# Patient Record
Sex: Male | Born: 1960
Health system: Southern US, Community
[De-identification: ages and names within clinical notes are randomized; demographics above are authoritative.]

## PROBLEM LIST (undated history)

## (undated) DIAGNOSIS — H209 Unspecified iridocyclitis: Secondary | ICD-10-CM

## (undated) DIAGNOSIS — R0600 Dyspnea, unspecified: Secondary | ICD-10-CM

## (undated) DIAGNOSIS — I10 Essential (primary) hypertension: Secondary | ICD-10-CM

## (undated) DIAGNOSIS — R0602 Shortness of breath: Secondary | ICD-10-CM

## (undated) DIAGNOSIS — R1013 Epigastric pain: Secondary | ICD-10-CM

## (undated) DIAGNOSIS — E669 Obesity, unspecified: Secondary | ICD-10-CM

## (undated) DIAGNOSIS — M1990 Unspecified osteoarthritis, unspecified site: Secondary | ICD-10-CM

## (undated) DIAGNOSIS — H40009 Preglaucoma, unspecified, unspecified eye: Secondary | ICD-10-CM

## (undated) DIAGNOSIS — I509 Heart failure, unspecified: Secondary | ICD-10-CM

## (undated) DIAGNOSIS — Z9109 Other allergy status, other than to drugs and biological substances: Secondary | ICD-10-CM

## (undated) DIAGNOSIS — R0609 Other forms of dyspnea: Secondary | ICD-10-CM

## (undated) DIAGNOSIS — J309 Allergic rhinitis, unspecified: Secondary | ICD-10-CM

## (undated) HISTORY — DX: Heart failure, unspecified: I50.9

## (undated) HISTORY — PX: MITRAL VALVE REPAIR: SHX2039

## (undated) HISTORY — DX: Obesity, unspecified: E66.9

## (undated) HISTORY — DX: Preglaucoma, unspecified, unspecified eye: H40.009

## (undated) HISTORY — DX: Allergic rhinitis, unspecified: J30.9

## (undated) HISTORY — PX: CARDIAC SURGERY: SHX584

## (undated) HISTORY — DX: Unspecified osteoarthritis, unspecified site: M19.90

## (undated) HISTORY — DX: Dyspnea, unspecified: R06.00

## (undated) HISTORY — DX: Unspecified iridocyclitis: H20.9

## (undated) HISTORY — DX: Epigastric pain: R10.13

## (undated) HISTORY — DX: Essential (primary) hypertension: I10

## (undated) HISTORY — DX: Other forms of dyspnea: R06.09

## (undated) HISTORY — DX: Shortness of breath: R06.02

## (undated) HISTORY — PX: COLONOSCOPY: SHX174

---

## 2003-11-15 HISTORY — PX: KNEE ARTHROSCOPY: SUR90

## 2003-11-15 HISTORY — PX: SHOULDER ARTHROSCOPY: SHX128

## 2016-03-29 ENCOUNTER — Encounter: Payer: Self-pay | Admitting: Internal Medicine

## 2016-03-29 ENCOUNTER — Emergency Department (HOSPITAL_COMMUNITY): Payer: BLUE CROSS/BLUE SHIELD

## 2016-03-29 ENCOUNTER — Ambulatory Visit (INDEPENDENT_AMBULATORY_CARE_PROVIDER_SITE_OTHER): Payer: BLUE CROSS/BLUE SHIELD | Admitting: Pharmacist

## 2016-03-29 ENCOUNTER — Ambulatory Visit (INDEPENDENT_AMBULATORY_CARE_PROVIDER_SITE_OTHER): Payer: BLUE CROSS/BLUE SHIELD | Admitting: Internal Medicine

## 2016-03-29 ENCOUNTER — Inpatient Hospital Stay (HOSPITAL_COMMUNITY)
Admission: EM | Admit: 2016-03-29 | Discharge: 2016-04-06 | DRG: 871 | Disposition: A | Discharge status: Home / Self Care | Payer: BLUE CROSS/BLUE SHIELD | Attending: Internal Medicine | Admitting: Internal Medicine

## 2016-03-29 ENCOUNTER — Encounter (HOSPITAL_COMMUNITY): Payer: Self-pay | Admitting: Nurse Practitioner

## 2016-03-29 VITALS — BP 98/56 | HR 65 | Temp 96.7°F

## 2016-03-29 DIAGNOSIS — I4892 Unspecified atrial flutter: Secondary | ICD-10-CM | POA: Diagnosis present

## 2016-03-29 DIAGNOSIS — I351 Nonrheumatic aortic (valve) insufficiency: Secondary | ICD-10-CM | POA: Diagnosis not present

## 2016-03-29 DIAGNOSIS — I480 Paroxysmal atrial fibrillation: Secondary | ICD-10-CM

## 2016-03-29 DIAGNOSIS — Z7901 Long term (current) use of anticoagulants: Secondary | ICD-10-CM | POA: Diagnosis not present

## 2016-03-29 DIAGNOSIS — Z6841 Body Mass Index (BMI) 40.0 and over, adult: Secondary | ICD-10-CM

## 2016-03-29 DIAGNOSIS — J9690 Respiratory failure, unspecified, unspecified whether with hypoxia or hypercapnia: Secondary | ICD-10-CM

## 2016-03-29 DIAGNOSIS — I484 Atypical atrial flutter: Secondary | ICD-10-CM | POA: Diagnosis not present

## 2016-03-29 DIAGNOSIS — I5043 Acute on chronic combined systolic (congestive) and diastolic (congestive) heart failure: Secondary | ICD-10-CM | POA: Diagnosis present

## 2016-03-29 DIAGNOSIS — J90 Pleural effusion, not elsewhere classified: Secondary | ICD-10-CM | POA: Diagnosis not present

## 2016-03-29 DIAGNOSIS — Z7982 Long term (current) use of aspirin: Secondary | ICD-10-CM

## 2016-03-29 DIAGNOSIS — R791 Abnormal coagulation profile: Secondary | ICD-10-CM | POA: Diagnosis present

## 2016-03-29 DIAGNOSIS — D696 Thrombocytopenia, unspecified: Secondary | ICD-10-CM | POA: Diagnosis present

## 2016-03-29 DIAGNOSIS — J9601 Acute respiratory failure with hypoxia: Secondary | ICD-10-CM | POA: Diagnosis present

## 2016-03-29 DIAGNOSIS — I313 Pericardial effusion (noninflammatory): Secondary | ICD-10-CM | POA: Diagnosis present

## 2016-03-29 DIAGNOSIS — Z79899 Other long term (current) drug therapy: Secondary | ICD-10-CM

## 2016-03-29 DIAGNOSIS — Z9889 Other specified postprocedural states: Secondary | ICD-10-CM | POA: Insufficient documentation

## 2016-03-29 DIAGNOSIS — R680 Hypothermia, not associated with low environmental temperature: Secondary | ICD-10-CM | POA: Diagnosis present

## 2016-03-29 DIAGNOSIS — N182 Chronic kidney disease, stage 2 (mild): Secondary | ICD-10-CM | POA: Diagnosis present

## 2016-03-29 DIAGNOSIS — I959 Hypotension, unspecified: Secondary | ICD-10-CM | POA: Insufficient documentation

## 2016-03-29 DIAGNOSIS — I130 Hypertensive heart and chronic kidney disease with heart failure and stage 1 through stage 4 chronic kidney disease, or unspecified chronic kidney disease: Secondary | ICD-10-CM | POA: Diagnosis present

## 2016-03-29 DIAGNOSIS — R0609 Other forms of dyspnea: Secondary | ICD-10-CM

## 2016-03-29 DIAGNOSIS — R0600 Dyspnea, unspecified: Secondary | ICD-10-CM | POA: Diagnosis present

## 2016-03-29 DIAGNOSIS — R739 Hyperglycemia, unspecified: Secondary | ICD-10-CM | POA: Diagnosis present

## 2016-03-29 DIAGNOSIS — B957 Other staphylococcus as the cause of diseases classified elsewhere: Secondary | ICD-10-CM | POA: Diagnosis present

## 2016-03-29 DIAGNOSIS — I340 Nonrheumatic mitral (valve) insufficiency: Secondary | ICD-10-CM | POA: Diagnosis not present

## 2016-03-29 DIAGNOSIS — A419 Sepsis, unspecified organism: Secondary | ICD-10-CM | POA: Diagnosis present

## 2016-03-29 DIAGNOSIS — E872 Acidosis: Secondary | ICD-10-CM | POA: Diagnosis present

## 2016-03-29 DIAGNOSIS — Z952 Presence of prosthetic heart valve: Secondary | ICD-10-CM | POA: Diagnosis not present

## 2016-03-29 DIAGNOSIS — I428 Other cardiomyopathies: Secondary | ICD-10-CM | POA: Diagnosis present

## 2016-03-29 DIAGNOSIS — I9589 Other hypotension: Secondary | ICD-10-CM | POA: Diagnosis not present

## 2016-03-29 DIAGNOSIS — N179 Acute kidney failure, unspecified: Secondary | ICD-10-CM | POA: Diagnosis present

## 2016-03-29 DIAGNOSIS — Z8679 Personal history of other diseases of the circulatory system: Secondary | ICD-10-CM

## 2016-03-29 DIAGNOSIS — Y95 Nosocomial condition: Secondary | ICD-10-CM | POA: Diagnosis present

## 2016-03-29 DIAGNOSIS — Z888 Allergy status to other drugs, medicaments and biological substances status: Secondary | ICD-10-CM

## 2016-03-29 DIAGNOSIS — Q211 Atrial septal defect: Secondary | ICD-10-CM | POA: Diagnosis not present

## 2016-03-29 DIAGNOSIS — R7881 Bacteremia: Secondary | ICD-10-CM | POA: Clinically undetermined

## 2016-03-29 DIAGNOSIS — R0602 Shortness of breath: Secondary | ICD-10-CM

## 2016-03-29 DIAGNOSIS — D6489 Other specified anemias: Secondary | ICD-10-CM | POA: Diagnosis present

## 2016-03-29 DIAGNOSIS — I319 Disease of pericardium, unspecified: Secondary | ICD-10-CM | POA: Diagnosis not present

## 2016-03-29 DIAGNOSIS — I5021 Acute systolic (congestive) heart failure: Secondary | ICD-10-CM | POA: Insufficient documentation

## 2016-03-29 DIAGNOSIS — J189 Pneumonia, unspecified organism: Secondary | ICD-10-CM | POA: Diagnosis present

## 2016-03-29 DIAGNOSIS — I3139 Other pericardial effusion (noninflammatory): Secondary | ICD-10-CM | POA: Insufficient documentation

## 2016-03-29 DIAGNOSIS — J9600 Acute respiratory failure, unspecified whether with hypoxia or hypercapnia: Secondary | ICD-10-CM

## 2016-03-29 HISTORY — DX: Other allergy status, other than to drugs and biological substances: Z91.09

## 2016-03-29 LAB — BASIC METABOLIC PANEL
Anion gap: 13 (ref 5–15)
BUN: 40 mg/dL — ABNORMAL HIGH (ref 6–20)
CALCIUM: 9.1 mg/dL (ref 8.9–10.3)
CO2: 23 mmol/L (ref 22–32)
CREATININE: 2.05 mg/dL — AB (ref 0.61–1.24)
Chloride: 99 mmol/L — ABNORMAL LOW (ref 101–111)
GFR calc non Af Amer: 35 mL/min — ABNORMAL LOW (ref >60)
GFR, EST AFRICAN AMERICAN: 40 mL/min — AB (ref >60)
Glucose, Bld: 115 mg/dL — ABNORMAL HIGH (ref 65–99)
Potassium: 4.3 mmol/L (ref 3.5–5.1)
Sodium: 135 mmol/L (ref 135–145)

## 2016-03-29 LAB — URINALYSIS, ROUTINE W REFLEX MICROSCOPIC
Bilirubin Urine: NEGATIVE
Glucose, UA: NEGATIVE mg/dL
Hgb urine dipstick: NEGATIVE
Ketones, ur: NEGATIVE mg/dL
LEUKOCYTES UA: NEGATIVE
NITRITE: NEGATIVE
Protein, ur: NEGATIVE mg/dL
SPECIFIC GRAVITY, URINE: 1.023 (ref 1.005–1.030)
pH: 5.5 (ref 5.0–8.0)

## 2016-03-29 LAB — I-STAT CG4 LACTIC ACID, ED
Lactic Acid, Venous: 3.52 mmol/L (ref 0.5–2.0)
Lactic Acid, Venous: 1.94 mmol/L (ref 0.5–2.0)

## 2016-03-29 LAB — PROTIME-INR
INR: 3.58 — ABNORMAL HIGH (ref 0.00–1.49)
PROTHROMBIN TIME: 34.9 s — AB (ref 11.6–15.2)

## 2016-03-29 LAB — HEPATIC FUNCTION PANEL
ALBUMIN: 3 g/dL — AB (ref 3.5–5.0)
ALK PHOS: 107 U/L (ref 38–126)
ALT: 112 U/L — AB (ref 17–63)
AST: 51 U/L — ABNORMAL HIGH (ref 15–41)
Bilirubin, Direct: 0.3 mg/dL (ref 0.1–0.5)
Indirect Bilirubin: 0.4 mg/dL (ref 0.3–0.9)
TOTAL PROTEIN: 7.2 g/dL (ref 6.5–8.1)
Total Bilirubin: 0.7 mg/dL (ref 0.3–1.2)

## 2016-03-29 LAB — CBC
HCT: 38.9 % — ABNORMAL LOW (ref 39.0–52.0)
Hemoglobin: 12.5 g/dL — ABNORMAL LOW (ref 13.0–17.0)
MCH: 30.1 pg (ref 26.0–34.0)
MCHC: 32.1 g/dL (ref 30.0–36.0)
MCV: 93.7 fL (ref 78.0–100.0)
PLATELETS: 644 10*3/uL — AB (ref 150–400)
RBC: 4.15 MIL/uL — AB (ref 4.22–5.81)
RDW: 13.1 % (ref 11.5–15.5)
WBC: 22 10*3/uL — ABNORMAL HIGH (ref 4.0–10.5)

## 2016-03-29 LAB — I-STAT TROPONIN, ED: TROPONIN I, POC: 0.22 ng/mL — AB (ref 0.00–0.08)

## 2016-03-29 LAB — PROCALCITONIN: Procalcitonin: 0.5 ng/mL

## 2016-03-29 LAB — GLUCOSE, CAPILLARY: Glucose-Capillary: 151 mg/dL — ABNORMAL HIGH (ref 65–99)

## 2016-03-29 LAB — POCT INR: INR: 5.4

## 2016-03-29 LAB — BRAIN NATRIURETIC PEPTIDE: B Natriuretic Peptide: 286.5 pg/mL — ABNORMAL HIGH (ref 0.0–100.0)

## 2016-03-29 LAB — TROPONIN I: TROPONIN I: 0.12 ng/mL — AB (ref <0.031)

## 2016-03-29 LAB — MRSA PCR SCREENING: MRSA by PCR: NEGATIVE

## 2016-03-29 MED ORDER — VANCOMYCIN HCL 10 G IV SOLR
1750.0000 mg | INTRAVENOUS | Status: DC
  Administered 2016-03-30: 1750 mg via INTRAVENOUS
  Filled 2016-03-29 (×2): qty 1750

## 2016-03-29 MED ORDER — ATORVASTATIN CALCIUM 40 MG PO TABS
40.0000 mg | ORAL_TABLET | Freq: Every day | ORAL | Status: DC
  Administered 2016-03-29 – 2016-04-05 (×8): 40 mg via ORAL
  Filled 2016-03-29 (×8): qty 1

## 2016-03-29 MED ORDER — DEXTROSE 5 % IV SOLN
2.0000 g | Freq: Once | INTRAVENOUS | Status: AC
  Administered 2016-03-29: 2 g via INTRAVENOUS
  Filled 2016-03-29: qty 2

## 2016-03-29 MED ORDER — SODIUM CHLORIDE 0.9 % IV BOLUS (SEPSIS)
1000.0000 mL | Freq: Once | INTRAVENOUS | Status: AC
Start: 2016-03-29 — End: 2016-03-29
  Administered 2016-03-29: 1000 mL via INTRAVENOUS

## 2016-03-29 MED ORDER — SODIUM CHLORIDE 0.9 % IV BOLUS (SEPSIS)
30.0000 mL/kg | Freq: Once | INTRAVENOUS | Status: DC
  Filled 2016-03-29: qty 4500

## 2016-03-29 MED ORDER — PIPERACILLIN-TAZOBACTAM 3.375 G IVPB
3.3750 g | Freq: Three times a day (TID) | INTRAVENOUS | Status: DC
  Administered 2016-03-30 – 2016-04-02 (×8): 3.375 g via INTRAVENOUS
  Filled 2016-03-29 (×11): qty 50

## 2016-03-29 MED ORDER — AMIODARONE IV BOLUS ONLY 150 MG/100ML: 150.0000 mg | Freq: Once | INTRAVENOUS | Status: DC

## 2016-03-29 MED ORDER — METOPROLOL TARTRATE 50 MG PO TABS
75.0000 mg | ORAL_TABLET | Freq: Two times a day (BID) | ORAL | Status: DC
  Administered 2016-03-30 – 2016-04-06 (×15): 75 mg via ORAL
  Filled 2016-03-29 (×16): qty 1

## 2016-03-29 MED ORDER — SODIUM CHLORIDE 0.9 % IV SOLN: 250.0000 mL | INTRAVENOUS | Status: DC | PRN

## 2016-03-29 MED ORDER — VANCOMYCIN HCL 10 G IV SOLR
2500.0000 mg | Freq: Once | INTRAVENOUS | Status: AC
  Administered 2016-03-29: 2500 mg via INTRAVENOUS
  Filled 2016-03-29: qty 2500

## 2016-03-29 MED ORDER — AMIODARONE HCL 200 MG PO TABS
200.0000 mg | ORAL_TABLET | Freq: Every morning | ORAL | Status: DC
  Administered 2016-03-30: 200 mg via ORAL
  Filled 2016-03-29: qty 1

## 2016-03-29 MED ORDER — SODIUM CHLORIDE 0.9 % IV SOLN
INTRAVENOUS | Status: DC
  Administered 2016-03-29: 18:00:00 via INTRAVENOUS

## 2016-03-29 NOTE — ED Notes (Signed)
Family at bedside. 

## 2016-03-29 NOTE — ED Notes (Signed)
Holding fluids per CCM.

## 2016-03-29 NOTE — Progress Notes (Signed)
    OFFICE NOTE  Chief Complaint:  DOE, sweating, fatigue  Primary Care Physician: Pcp Not In System  HPI:  Henry Dougherty is a 55 y.o. male who I briefly saw today as the doctor of the day in the office. I was called to the Coumadin clinic by one of our pharmacists who noted that the patient was profusely diaphoretic, progressively short of breath and fatigue. I briefly reviewed paperwork as Henry Dougherty has not yet established as a patient of our practice. It turns out he recently had what sounds like acute severe mitral regurgitation secondary to a ruptured mitral valve cord and underwent mitral valve repair. He also had atrial fibrillation and underwent a MAZE procedure as well as clipping of the left atrial appendage and closure of the PFO. 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 He was accompanied today by his wife, daughter and granddaughter. Henry Dougherty initially looked quite ill, pale and diaphoretic and short of breath. Blood pressure was notably hypotensive. He did not have fever. I discussed options with him but felt that he should present to the emergency department for further workup. I'm concerned about a pneumonia and/or possibly bacteremia or sepsis given his recent surgery.   PMHx:  Past Medical History  Diagnosis Date  . Environmental allergies     Past Surgical History  Procedure Laterality Date  . Mitral valve repair    . Cardiac surgery      FAMHx:  No family history on file.  SOCHx:   reports that he has never smoked. He does not have any smokeless tobacco history on file. He reports that he drinks alcohol. He reports that he does not use illicit drugs.  ALLERGIES:  No Known Allergies  ROS: Review of systems not obtained due to patient factors.  HOME MEDS: No current outpatient prescriptions on file.   No current facility-administered medications for this visit.    LABS/IMAGING: No results found  for this or any previous visit (from the past 48 hour(s)). No results found.  WEIGHTS: Wt Readings from Last 3 Encounters:  No data found for Wt    VITALS: There were no vitals taken for this visit.  EXAM: General appearance: alert, mild distress and moderately obese Lungs: diminished breath sounds RLL and rales RLL Heart: regular rate and rhythm Extremities: extremities normal, atraumatic, no cyanosis or edema Skin: Diffuse ecchymosis Neurologic: Grossly normal  EKG: Deferred  ASSESSMENT: 1. Arterial hypotension 2. Progressive DOE 3. Recent Mitral valve repair/MAZE/PFO closure and LAA ligation 4. PAF on warfarin - supratherapeutic INR  PLAN: 1.   Henry Dougherty was seen acutely today during a Coumadin clinic visit at which time a quick assessment reveals significant hypotension, progressive shortness of breath, decreased breath sounds particularly the right lower lobe and signs and symptoms concerning for possible infection. He is hypotensive and diaphoretic. His INR is supratherapeutic on warfarin. Given his recent surgery and instrumentation, be concerned about possible infection. I've advised him to proceed to the emergency department. This will allow [place] to more quickly workup his symptoms. As he is a new patient to our practice I'm happy to see him in follow-up since we have already established a relationship.  Pixie Casino, MD, [facility] Attending Cardiologist [place] 03/29/2016, 1:15 PM

## 2016-03-29 NOTE — ED Notes (Signed)
[facility] notified on code sepsis notified @ 1420.

## 2016-03-29 NOTE — Patient Instructions (Signed)
Please go to the emergency room to be evaluated for SOB.

## 2016-03-29 NOTE — ED Provider Notes (Signed)
CSN: [id number]     Arrival date & time 03/29/16  1245 History   First MD Initiated Contact with Patient 03/29/16 1314     Chief Complaint  Patient presents with  . Shortness of Breath     (Consider location/radiation/quality/duration/timing/severity/associated sxs/prior Treatment) HPI Henry Dougherty is a 55 y.o. male here for evaluation of shortness of breath. Patient reports roughly 2 weeks ago he had open heart surgery in [place], no identifiable coronary artery disease. Wife at bedside reports they did perform MAZE procedure and had to repair his mitral valve. He is followed by Dr. Debara Pickett now. He was at Coumadin clinic today and his INR was noted to be 5.4. He reports over the past 2 days he has become incredibly short of breath, extensive diaphoresis, blurred vision with head movement and episodes of hot flashes. He denies any fevers, chills, overt chest pain, nausea or vomiting, leg swelling or cough. Was recommended to come to emergency department today from Coumadin clinic due to severe shortness of breath.  Past Medical History  Diagnosis Date  . Environmental allergies    Past Surgical History  Procedure Laterality Date  . Mitral valve repair    . Cardiac surgery     History reviewed. No pertinent family history. Social History  Substance Use Topics  . Smoking status: Never Smoker   . Smokeless tobacco: None  . Alcohol Use: Yes    Review of Systems A 10 point review of systems was completed and was negative except for pertinent positives and negatives as mentioned in the history of present illness     Allergies  Review of patient's allergies indicates no known allergies.  Home Medications   Prior to Admission medications   Not on File   BP 99/58 mmHg  Pulse 131  Temp(Src) 97.5 F (36.4 C) (Oral)  Resp 20  SpO2 97% Physical Exam  Constitutional: He is oriented to person, place, and time. He appears well-developed and well-nourished.  Obese Caucasian male.  Increased work of breathing. 98% on 2 L nasal cannula.  HENT:  Head: Normocephalic and atraumatic.  Mouth/Throat: Oropharynx is clear and moist.  Eyes: Conjunctivae are normal. Pupils are equal, round, and reactive to light. Right eye exhibits no discharge. Left eye exhibits no discharge. No scleral icterus.  Neck: Normal range of motion. Neck supple.  Cardiovascular:  Tachycardic  Pulmonary/Chest:  Increased work of breathing on 2 L nasal cannula. Diffuse rales and right fields  Abdominal: Soft. There is no tenderness.  Musculoskeletal: He exhibits no edema or tenderness.  Neurological: He is alert and oriented to person, place, and time.  Cranial Nerves II-XII grossly intact  Skin: Skin is warm and dry. No rash noted.  Psychiatric: He has a normal mood and affect.  Nursing note and vitals reviewed.   ED Course  Procedures (including critical care time) Labs Review Labs Reviewed  BASIC METABOLIC PANEL - Abnormal; Notable for the following:    Chloride 99 (*)    Glucose, Bld 115 (*)    BUN 40 (*)    Creatinine, Ser 2.05 (*)    GFR calc non Af Amer 35 (*)    GFR calc Af Amer 40 (*)    All other components within normal limits  CBC - Abnormal; Notable for the following:    WBC 22.0 (*)    RBC 4.15 (*)    Hemoglobin 12.5 (*)    HCT 38.9 (*)    Platelets 644 (*)    All other  components within normal limits  PROTIME-INR - Abnormal; Notable for the following:    Prothrombin Time 34.9 (*)    INR 3.58 (*)    All other components within normal limits  URINALYSIS, ROUTINE W REFLEX MICROSCOPIC (NOT AT [facility]) - Abnormal; Notable for the following:    APPearance HAZY (*)    All other components within normal limits  HEPATIC FUNCTION PANEL - Abnormal; Notable for the following:    Albumin 3.0 (*)    AST 51 (*)    ALT 112 (*)    All other components within normal limits  BRAIN NATRIURETIC PEPTIDE - Abnormal; Notable for the following:    B Natriuretic Peptide 286.5 (*)    All  other components within normal limits  TROPONIN I - Abnormal; Notable for the following:    Troponin I 0.12 (*)    All other components within normal limits  TROPONIN I - Abnormal; Notable for the following:    Troponin I 0.12 (*)    All other components within normal limits  TROPONIN I - Abnormal; Notable for the following:    Troponin I 0.14 (*)    All other components within normal limits  GLUCOSE, CAPILLARY - Abnormal; Notable for the following:    Glucose-Capillary 151 (*)    All other components within normal limits  BASIC METABOLIC PANEL - Abnormal; Notable for the following:    Glucose, Bld 165 (*)    BUN 34 (*)    Creatinine, Ser 1.52 (*)    Calcium 8.5 (*)    GFR calc non Af Amer 50 (*)    GFR calc Af Amer 57 (*)    All other components within normal limits  CBC - Abnormal; Notable for the following:    WBC 18.5 (*)    RBC 3.51 (*)    Hemoglobin 10.7 (*)    HCT 32.5 (*)    Platelets 525 (*)    All other components within normal limits  I-STAT TROPOININ, ED - Abnormal; Notable for the following:    Troponin i, poc 0.22 (*)    All other components within normal limits  I-STAT CG4 LACTIC ACID, ED - Abnormal; Notable for the following:    Lactic Acid, Venous 3.52 (*)    All other components within normal limits  MRSA PCR SCREENING  CULTURE, BLOOD (ROUTINE X 2)  CULTURE, BLOOD (ROUTINE X 2)  URINE CULTURE  URINE CULTURE  PROCALCITONIN  I-STAT CG4 LACTIC ACID, ED    Imaging Review Dg Chest Portable 1 View  03/29/2016  CLINICAL DATA:  Shortness of breath since yesterday. Patient had recent mitro valve repair in [place]. EXAM: PORTABLE CHEST 1 VIEW COMPARISON:  None. FINDINGS: The mediastinal contour is normal. Heart size enlarged. Mitral valvular replacement ring is noted. There is consolidation of the right mid and lung base. There is a small right pleural effusion. The left lung is clear. No acute abnormalities identified in the visualized bones. IMPRESSION:  Consolidation of the right mid and lung base with right pleural effusion for pneumonia. Electronically Signed   By: Abelardo Diesel M.D.   On: 03/29/2016 13:56   I have personally reviewed and evaluated these images and lab results as part of my medical decision-making.   EKG Interpretation   Date/Time:  Tuesday Mar 29 2016 12:50:34 EDT Ventricular Rate:  130 PR Interval:  112 QRS Duration: 94 QT Interval:  274 QTC Calculation: 403 R Axis:   46 Text Interpretation:  Sinus tachycardia Low voltage  QRS Nonspecific ST and  T wave abnormality Abnormal ECG Confirmed by RAY MD, Andee Poles [phone]) on  03/29/2016 1:53:18 PM     Meds given in ED:  Medications  ceFEPIme (MAXIPIME) 2 g in dextrose 5 % 50 mL IVPB (not administered)  vancomycin (VANCOCIN) 2,500 mg in sodium chloride 0.9 % 500 mL IVPB (not administered)  sodium chloride 0.9 % bolus 1,000 mL (1,000 mLs Intravenous New Bag/Given 03/29/16 1411)    New Prescriptions   No medications on file   Filed Vitals:   03/29/16 1304 03/29/16 1330  BP: 99/58 95/67  Pulse: 131 131  Temp: 97.5 F (36.4 C)   TempSrc: Oral   Resp: 20 24  SpO2: 97% 98%    MDM  Earney Schofield is a 55 y.o. male with recent maze procedure and mitral valve repair, here for evaluation of shortness of breath, diaphoresis. On arrival, patient does have increased work of breathing, on 2 L nasal cannula satting at 96%. Slightly hypotensive at 98/56, heart rate 130s. Maintaining conversations speaking in full sentences. Given recent surgical procedure, concern for pneumonia versus possible bacteremia versus other surgical complication. Obtaining screening labs, chest x-ray. Lactic acid 3.52, leukocytosis 22.0, troponin elevated 0.22, INR 3.58. Chest x-ray shows right-sided pneumonia. We'll treat for healthcare associated pneumonia. Discussed with Dr. Jeanell Sparrow who also saw and evaluated the patient. Plan for ICU medical admission. Discussed with critical care, Dr. Wonda Amis, will see  in ED. Discussed with critical care Salvadore Dom, NP-will admit. Final diagnoses:  SOB (shortness of breath)      Comer Locket, PA-C 03/30/16 [id number]  Pattricia Boss, MD 04/02/16 1429

## 2016-03-29 NOTE — ED Notes (Signed)
Pt had episode of coughing up a small amount of pink frothy sputum. NP paul made aware.

## 2016-03-29 NOTE — ED Notes (Signed)
He c/o2 day history of episodes of diaphoresis, sob, weakness. Symptoms seem to increase after exertion.  he had open heart surgery in [place] 2 weeks ago. He is alert and able to speak in full sentences in triage.

## 2016-03-29 NOTE — Progress Notes (Addendum)
Pharmacy Antibiotic Note  Henry Dougherty is a 55 y.o. male admitted on 03/29/2016 with pneumonia.  Pharmacy has been consulted for vancomycin dosing. Cefepime ordered x 1 per MD in the ED. AF, wbc 22, LA 3.52. SCr 2.05 on admit, normalized CrCl~40.  Code sepsis called at 63 - abx already started at 14.  Plan: Vanc 2500mg  IV x 1; then 1750mg  IV q24h Cefepime 2g IV x 1 per MD Monitor clinical progress, c/s, renal function, abx plan/LOT VT@SS  as indicated F/u need to continue cefepime?     Temp (24hrs), Avg:97.1 F (36.2 C), Min:96.7 F (35.9 C), Max:97.5 F (36.4 C)   Recent Labs Lab 03/29/16 1347  LATICACIDVEN 3.52*    CrCl cannot be calculated (Unknown ideal weight.).    No Known Allergies  Antimicrobials this admission: 5/16 vanc >>  5/16 cefepime x1  Dose adjustments this admission:   Microbiology results: 5/16 BCx:  5/116 UCx:     Elicia Lamp, PharmD, BCPS Clinical Pharmacist Pager [phone] 03/29/2016 2:03 PM
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
## 2016-03-29 NOTE — ED Notes (Signed)
Critical care at bedside  

## 2016-03-29 NOTE — Progress Notes (Signed)
Pharmacy Antibiotic Note  Rylan Mine is a 55 y.o. male admitted on 03/29/2016 with HCAP.  Pharmacy has been consulted for Zosyn dosing.  Received cefepime x 1 at 1400  in the ED. Afebrile, wbc 22, LA 3.52. SCr 2.05 on admit, normalized CrCl~40. Also started on vancomycin  Plan: Zosyn 3.375g IV q8h (4 hour infusion).  Weight: (!) 326 lb (147.873 kg)  Temp (24hrs), Avg:97.1 F (36.2 C), Min:96.7 F (35.9 C), Max:97.5 F (36.4 C)   Recent Labs Lab 03/29/16 1250 03/29/16 1347 03/29/16 1611  WBC 22.0*  --   --   CREATININE 2.05*  --   --   LATICACIDVEN  --  3.52* 1.94    CrCl cannot be calculated (Unknown ideal weight.).    Allergies  Allergen Reactions  . Amiodarone     Blisters and whelps to IV FORM.    Antimicrobials this admission: 5/16 cefepime >> x1 5/16 vancomycin >>  5/16 Zosyn >>  Dose adjustments this admission: N/A  Microbiology results: 5/16 BCx:  5/16 UCx:     Thank you for allowing pharmacy to be a part of this patient's care.  Wynelle Fanny 03/29/2016 5:39 PM

## 2016-03-30 ENCOUNTER — Inpatient Hospital Stay (HOSPITAL_COMMUNITY): Payer: BLUE CROSS/BLUE SHIELD

## 2016-03-30 DIAGNOSIS — I4892 Unspecified atrial flutter: Secondary | ICD-10-CM

## 2016-03-30 DIAGNOSIS — Z8679 Personal history of other diseases of the circulatory system: Secondary | ICD-10-CM

## 2016-03-30 DIAGNOSIS — I340 Nonrheumatic mitral (valve) insufficiency: Secondary | ICD-10-CM

## 2016-03-30 DIAGNOSIS — J189 Pneumonia, unspecified organism: Secondary | ICD-10-CM | POA: Diagnosis present

## 2016-03-30 DIAGNOSIS — R7881 Bacteremia: Secondary | ICD-10-CM

## 2016-03-30 DIAGNOSIS — I9589 Other hypotension: Secondary | ICD-10-CM

## 2016-03-30 DIAGNOSIS — Z9889 Other specified postprocedural states: Secondary | ICD-10-CM

## 2016-03-30 LAB — BASIC METABOLIC PANEL
ANION GAP: 13 (ref 5–15)
BUN: 34 mg/dL — ABNORMAL HIGH (ref 6–20)
CHLORIDE: 101 mmol/L (ref 101–111)
CO2: 22 mmol/L (ref 22–32)
Calcium: 8.5 mg/dL — ABNORMAL LOW (ref 8.9–10.3)
Creatinine, Ser: 1.52 mg/dL — ABNORMAL HIGH (ref 0.61–1.24)
GFR calc Af Amer: 57 mL/min — ABNORMAL LOW (ref >60)
GFR calc non Af Amer: 50 mL/min — ABNORMAL LOW (ref >60)
GLUCOSE: 165 mg/dL — AB (ref 65–99)
POTASSIUM: 4.1 mmol/L (ref 3.5–5.1)
Sodium: 136 mmol/L (ref 135–145)

## 2016-03-30 LAB — BLOOD CULTURE ID PANEL (REFLEXED)
ACINETOBACTER BAUMANNII: NOT DETECTED
CANDIDA ALBICANS: NOT DETECTED
CANDIDA GLABRATA: NOT DETECTED
CANDIDA KRUSEI: NOT DETECTED
CANDIDA PARAPSILOSIS: NOT DETECTED
CARBAPENEM RESISTANCE: NOT DETECTED
Candida tropicalis: NOT DETECTED
ENTEROBACTER CLOACAE COMPLEX: NOT DETECTED
ENTEROBACTERIACEAE SPECIES: NOT DETECTED
ESCHERICHIA COLI: NOT DETECTED
Enterococcus species: NOT DETECTED
Haemophilus influenzae: NOT DETECTED
KLEBSIELLA OXYTOCA: NOT DETECTED
KLEBSIELLA PNEUMONIAE: NOT DETECTED
Listeria monocytogenes: NOT DETECTED
Methicillin resistance: DETECTED — AB
NEISSERIA MENINGITIDIS: NOT DETECTED
PSEUDOMONAS AERUGINOSA: NOT DETECTED
Proteus species: NOT DETECTED
STAPHYLOCOCCUS SPECIES: DETECTED — AB
STREPTOCOCCUS AGALACTIAE: NOT DETECTED
STREPTOCOCCUS PNEUMONIAE: NOT DETECTED
STREPTOCOCCUS SPECIES: NOT DETECTED
Serratia marcescens: NOT DETECTED
Staphylococcus aureus (BCID): NOT DETECTED
Streptococcus pyogenes: NOT DETECTED
Vancomycin resistance: NOT DETECTED

## 2016-03-30 LAB — TROPONIN I
TROPONIN I: 0.12 ng/mL — AB (ref <0.031)
TROPONIN I: 0.14 ng/mL — AB (ref <0.031)

## 2016-03-30 LAB — ECHOCARDIOGRAM COMPLETE
Height: 70 in
Weight: 5287.51 oz

## 2016-03-30 LAB — CBC
HEMATOCRIT: 32.5 % — AB (ref 39.0–52.0)
HEMOGLOBIN: 10.7 g/dL — AB (ref 13.0–17.0)
MCH: 30.5 pg (ref 26.0–34.0)
MCHC: 32.9 g/dL (ref 30.0–36.0)
MCV: 92.6 fL (ref 78.0–100.0)
Platelets: 525 10*3/uL — ABNORMAL HIGH (ref 150–400)
RBC: 3.51 MIL/uL — ABNORMAL LOW (ref 4.22–5.81)
RDW: 13.3 % (ref 11.5–15.5)
WBC: 18.5 10*3/uL — ABNORMAL HIGH (ref 4.0–10.5)

## 2016-03-30 LAB — PROTIME-INR
INR: 3.12 — AB (ref 0.00–1.49)
Prothrombin Time: 31.6 seconds — ABNORMAL HIGH (ref 11.6–15.2)

## 2016-03-30 MED ORDER — METOPROLOL TARTRATE 5 MG/5ML IV SOLN
5.0000 mg | Freq: Four times a day (QID) | INTRAVENOUS | Status: DC
  Filled 2016-03-30: qty 5

## 2016-03-30 MED ORDER — ONDANSETRON HCL 4 MG/2ML IJ SOLN
INTRAMUSCULAR | Status: AC
  Administered 2016-03-30: 4 mg
  Filled 2016-03-30: qty 2

## 2016-03-30 MED ORDER — AMIODARONE HCL 200 MG PO TABS
400.0000 mg | ORAL_TABLET | Freq: Two times a day (BID) | ORAL | Status: AC
  Administered 2016-03-31 (×3): 400 mg via ORAL
  Filled 2016-03-30 (×3): qty 2

## 2016-03-30 MED ORDER — AMIODARONE HCL 200 MG PO TABS
200.0000 mg | ORAL_TABLET | Freq: Once | ORAL | Status: AC
  Administered 2016-03-30: 200 mg via ORAL
  Filled 2016-03-30: qty 1

## 2016-03-30 MED ORDER — ONDANSETRON HCL 4 MG/2ML IJ SOLN
4.0000 mg | Freq: Four times a day (QID) | INTRAMUSCULAR | Status: DC | PRN
  Administered 2016-03-30: 4 mg via INTRAVENOUS
  Filled 2016-03-30: qty 2

## 2016-03-30 MED ORDER — PERFLUTREN LIPID MICROSPHERE
1.0000 mL | INTRAVENOUS | Status: AC | PRN
  Administered 2016-03-30: 3 mL via INTRAVENOUS
  Filled 2016-03-30: qty 10

## 2016-03-30 MED ORDER — AMLODIPINE BESYLATE 10 MG PO TABS
10.0000 mg | ORAL_TABLET | Freq: Every morning | ORAL | Status: DC
  Administered 2016-03-30 – 2016-04-01 (×3): 10 mg via ORAL
  Filled 2016-03-30 (×3): qty 1

## 2016-03-30 MED ORDER — ASPIRIN EC 81 MG PO TBEC
81.0000 mg | DELAYED_RELEASE_TABLET | Freq: Every morning | ORAL | Status: DC
  Administered 2016-03-30 – 2016-04-06 (×8): 81 mg via ORAL
  Filled 2016-03-30 (×9): qty 1

## 2016-03-30 MED ORDER — METOPROLOL TARTRATE 50 MG PO TABS: 75.0000 mg | ORAL_TABLET | Freq: Two times a day (BID) | ORAL | Status: DC

## 2016-03-30 MED ORDER — PANTOPRAZOLE SODIUM 40 MG PO TBEC
40.0000 mg | DELAYED_RELEASE_TABLET | Freq: Every day | ORAL | Status: DC
  Administered 2016-03-30 – 2016-04-06 (×8): 40 mg via ORAL
  Filled 2016-03-30 (×9): qty 1

## 2016-03-30 MED ORDER — CETYLPYRIDINIUM CHLORIDE 0.05 % MT LIQD
7.0000 mL | Freq: Two times a day (BID) | OROMUCOSAL | Status: DC
  Administered 2016-03-31 – 2016-04-02 (×4): 7 mL via OROMUCOSAL

## 2016-03-30 MED ORDER — ALUM & MAG HYDROXIDE-SIMETH 200-200-20 MG/5ML PO SUSP
30.0000 mL | Freq: Four times a day (QID) | ORAL | Status: DC | PRN
  Administered 2016-03-30: 30 mL via ORAL
  Filled 2016-03-30 (×2): qty 30

## 2016-03-30 MED ORDER — METOPROLOL TARTRATE 5 MG/5ML IV SOLN
5.0000 mg | Freq: Four times a day (QID) | INTRAVENOUS | Status: DC | PRN
  Administered 2016-03-30 (×2): 5 mg via INTRAVENOUS
  Filled 2016-03-30: qty 5

## 2016-03-30 MED ORDER — ONDANSETRON HCL 4 MG/5ML PO SOLN
4.0000 mg | Freq: Three times a day (TID) | ORAL | Status: DC | PRN
  Filled 2016-03-30: qty 5

## 2016-03-30 NOTE — Progress Notes (Addendum)
  PHARMACY - PHYSICIAN COMMUNICATION CRITICAL VALUE ALERT - BLOOD CULTURE IDENTIFICATION (BCID)  Results for orders placed or performed during the hospital encounter of 03/29/16  Blood Culture ID Panel (Reflexed) (Collected: 03/29/2016  2:19 PM)  Result Value Ref Range   Enterococcus species NOT DETECTED NOT DETECTED   Vancomycin resistance NOT DETECTED NOT DETECTED   Listeria monocytogenes NOT DETECTED NOT DETECTED   Staphylococcus species DETECTED (A) NOT DETECTED   Staphylococcus aureus NOT DETECTED NOT DETECTED   Methicillin resistance DETECTED (A) NOT DETECTED   Streptococcus species NOT DETECTED NOT DETECTED   Streptococcus agalactiae NOT DETECTED NOT DETECTED   Streptococcus pneumoniae NOT DETECTED NOT DETECTED   Streptococcus pyogenes NOT DETECTED NOT DETECTED   Acinetobacter baumannii NOT DETECTED NOT DETECTED   Enterobacteriaceae species NOT DETECTED NOT DETECTED   Enterobacter cloacae complex NOT DETECTED NOT DETECTED   Escherichia coli NOT DETECTED NOT DETECTED   Klebsiella oxytoca NOT DETECTED NOT DETECTED   Klebsiella pneumoniae NOT DETECTED NOT DETECTED   Proteus species NOT DETECTED NOT DETECTED   Serratia marcescens NOT DETECTED NOT DETECTED   Carbapenem resistance NOT DETECTED NOT DETECTED   Haemophilus influenzae NOT DETECTED NOT DETECTED   Neisseria meningitidis NOT DETECTED NOT DETECTED   Pseudomonas aeruginosa NOT DETECTED NOT DETECTED   Candida albicans NOT DETECTED NOT DETECTED   Candida glabrata NOT DETECTED NOT DETECTED   Candida krusei NOT DETECTED NOT DETECTED   Candida parapsilosis NOT DETECTED NOT DETECTED   Candida tropicalis NOT DETECTED NOT DETECTED    Name of physician (or Provider) Contacted: Dr. Halford Chessman of [facility]  Changes to prescribed antibiotics required: Pt with 1/2 blood cultures positive for CoNS.  Pt is at increased risk of true + culture since has valvular disease and history of MVR.  To continue vancomycin and zosyn - no change in antibiotics  at this time.  Pt getting TTE.  Haleema Vanderheyden L. Nicole Kindred, PharmD PGY2 Infectious Diseases Pharmacy Resident Pager: [phone] 03/30/2016 2:14 PM

## 2016-03-30 NOTE — Consult Note (Signed)
CARDIOLOGY CONSULT NOTE   Patient ID: Henry Dougherty MRN: [id number] DOB/AGE: 23-Apr-1961 55 y.o.  Admit date: 03/29/2016  Primary Physician   Pcp Not In System Primary Cardiologist: Dr. Debara Pickett Reason for Consultation: Recent MAZE, mitral valve repair, tachycardia     HPI: Henry Dougherty is a 55 yo previously healthy male with PMHx of atrial fibrillation w RVR s/p MAZE procedure, PFO s/p closure, and Severe MV regurgitation from cord rupture s/p MV repair in April 2017. Patient lives in [place], but works in [place], He was recently hospitalized for 2 week in [place] after being seen for progressive dyspnea and found to have afib with RVR, PFO and mitral cord rupture causing severe mitral valve regurgitation. The above procedures were performed in [place] and patient was discharged after 2 weeks. He returned home to [place] on 5/13. On 5/16, patient was being seen at the coumadin clinic and establishing care with Dr. Debara Pickett. At the clinic, he was found to be diaphoretic, dyspneic, and hypotensive and referred to the ED.   In the ED, T 97.5, BP 99/58, HR 131, RR 20, 97% on room air. Initial labs revealed creatinine 2.05, BNP 286, WBC 22, Plts 644, INR 3.58, lactic acid 3.52>1.94. Troponins have trended 0.22>0.12>0.12>0.14. CXR showed consolidation of the right mid and lung base with right pleural effusion for pneumonia. EKG showed aflutter, HR 130. Patient admitted by [facility] for acute hypoxic respiratory failure 2/2 HCAP, R pleural effusion and atrial fibrillation with RVR. Patient started on Vanc, Zosyn, lopressor and amiodarone.  Patient was seen and examined this morning. He admits to shortness of breath, but denies chest pain, nausea, lightheadedness, palpitations.    Past Medical History  Diagnosis Date  . Environmental allergies      Past Surgical History  Procedure Laterality Date  . Mitral valve repair    . Cardiac surgery      Allergies  Allergen Reactions  . Amiodarone    Blisters and whelps to IV FORM.    I have reviewed the patient's current medications . amiodarone  200 mg Oral Once  . amiodarone  400 mg Oral BID  . amLODipine  10 mg Oral q morning - 10a  . antiseptic oral rinse  7 mL Mouth Rinse BID  . aspirin EC  81 mg Oral q morning - 10a  . atorvastatin  40 mg Oral QHS  . metoprolol tartrate  75 mg Oral BID  . piperacillin-tazobactam (ZOSYN)  IV  3.375 g Intravenous Q8H  . vancomycin  1,750 mg Intravenous Q24H     sodium chloride, metoprolol  Prior to Admission medications   Medication Sig Start Date End Date Taking? Authorizing Provider  amiodarone (PACERONE) 200 MG tablet Take 200 mg by mouth every morning.   Yes Historical Provider, MD  amLODipine (NORVASC) 10 MG tablet Take 10 mg by mouth every morning.   Yes Historical Provider, MD  aspirin EC 81 MG tablet Take 81 mg by mouth every morning.   Yes Historical Provider, MD  atorvastatin (LIPITOR) 40 MG tablet Take 40 mg by mouth at bedtime.   Yes Historical Provider, MD  bumetanide (BUMEX) 1 MG tablet Take 2 mg by mouth every morning.   Yes Historical Provider, MD  cetirizine (ZYRTEC) 5 MG tablet Take 5 mg by mouth every morning.   Yes Historical Provider, MD  docusate sodium (COLACE) 100 MG capsule Take 100 mg by mouth 2 (two) times daily.   Yes Historical Provider, MD  famotidine (PEPCID) 20  MG tablet Take 20 mg by mouth 2 (two) times daily.   Yes Historical Provider, MD  fluticasone (FLONASE) 50 MCG/ACT nasal spray Place 2 sprays into both nostrils daily.   Yes Historical Provider, MD  ibuprofen (ADVIL,MOTRIN) 600 MG tablet Take 600 mg by mouth 2 (two) times daily as needed for mild pain or moderate pain.   Yes Historical Provider, MD  losartan (COZAAR) 50 MG tablet Take 50 mg by mouth every morning.   Yes Historical Provider, MD  metoprolol tartrate (LOPRESSOR) 25 MG tablet Take 75 mg by mouth 2 (two) times daily.   Yes Historical Provider, MD  oxyCODONE-acetaminophen (PERCOCET/ROXICET)  5-325 MG tablet Take 2 tablets by mouth every 6 (six) hours as needed for moderate pain.   Yes Historical Provider, MD  potassium chloride SA (K-DUR,KLOR-CON) 20 MEQ tablet Take 20 mEq by mouth every morning.   Yes Historical Provider, MD  warfarin (COUMADIN) 5 MG tablet Take 5 mg by mouth every afternoon on Sun/Tues/Thurs/Sat and 10 mg on Mon/Wed/Fri   Yes Historical Provider, MD     Social History   Social History  . Marital Status: Married    Spouse Name: N/A  . Number of Children: N/A  . Years of Education: N/A   Occupational History  . Not on file.   Social History Main Topics  . Smoking status: Never Smoker   . Smokeless tobacco: Not on file  . Alcohol Use: Yes  . Drug Use: No  . Sexual Activity: Not on file   Other Topics Concern  . Not on file   Social History Narrative    No family status information on file.   History reviewed. No pertinent family history.   ROS:   General: Admits to fatigue, diaphoresis. Denies fever, chills, change in appetite.  Respiratory: Admits to SOB, productive cough, DOE. Denies chest tightness, and wheezing.   Cardiovascular: Denies chest pain and palpitations.  Gastrointestinal: Denies nausea, vomiting, abdominal pain, diarrhea, constipation Genitourinary: Denies dysuria, urgency, frequency Endocrine: Denies hot or cold intolerance. Musculoskeletal: Denies myalgias, back pain.  Neurological: Admits to weakness. Denies dizziness, headaches, lightheadedness.  Physical Exam: Filed Vitals:   03/30/16 1118 03/30/16 1120 03/30/16 1130 03/30/16 1145  BP:  106/75 112/82 126/67  Pulse:  139 137 136  Temp: 98 F (36.7 C)     TempSrc: Oral     Resp:  23 27 20   Height:      Weight:      SpO2:  99% 99% 97%   General: Vital signs reviewed.  Patient is well-developed and well-nourished, in mild acute distress and cooperative with exam.  Head: Normocephalic and atraumatic. Eyes: conjunctivae normal, no scleral icterus.  Neck: Supple,  trachea midline, no JVD, no carotid bruit present.  Cardiovascular: Tachycardic, regular rhythm, S1 normal, S2 normal, no murmurs, gallops, or rubs. Pulmonary/Chest: Increased work of breath. Inspiratory crackles in right lower lung field, no rhonchi or wheezing.  Abdominal: Soft, non-tender, non-distended, BS + Extremities: Trace lower extremity edema bilaterally Neurological: Awake and alert Skin: Surgical incision on right chest C/D/I with surrounding ecchymosis. Ecchymoses on lower abdominal wall. Psychiatric: Normal mood and affect. speech and behavior is normal. Cognition and memory are normal.   Labs:   Lab Results  Component Value Date   WBC 18.5* 03/30/2016   HGB 10.7* 03/30/2016   HCT 32.5* 03/30/2016   MCV 92.6 03/30/2016   PLT 525* 03/30/2016    Recent Labs  03/29/16 1250  INR 3.58*  Recent Labs Lab 03/29/16 1250 03/30/16 0207  NA 135 136  K 4.3 4.1  CL 99* 101  CO2 23 22  BUN 40* 34*  CREATININE 2.05* 1.52*  CALCIUM 9.1 8.5*  PROT 7.2  --   BILITOT 0.7  --   ALKPHOS 107  --   ALT 112*  --   AST 51*  --   GLUCOSE 115* 165*  ALBUMIN 3.0*  --     Recent Labs  03/29/16 1630 03/29/16 2315 03/30/16 0207  TROPONINI 0.12* 0.12* 0.14*    Recent Labs  03/29/16 1342  TROPIPOC 0.22*   B NATRIURETIC PEPTIDE  Date/Time Value Ref Range Status  03/29/2016 12:50 PM 286.5* 0.0 - 100.0 pg/mL Final   Echo: pending  ECG:  Sinus Tachycardia, HR 143  Radiology:  Dg Chest Portable 1 View  03/29/2016  CLINICAL DATA:  Shortness of breath since yesterday. Patient had recent mitro valve repair in [place]. EXAM: PORTABLE CHEST 1 VIEW COMPARISON:  None. FINDINGS: The mediastinal contour is normal. Heart size enlarged. Mitral valvular replacement ring is noted. There is consolidation of the right mid and lung base. There is a small right pleural effusion. The left lung is clear. No acute abnormalities identified in the visualized bones. IMPRESSION:  Consolidation of the right mid and lung base with right pleural effusion for pneumonia. Electronically Signed   By: Abelardo Diesel M.D.   On: 03/29/2016 13:56    ASSESSMENT AND PLAN:    Principal Problem:   HCAP (healthcare-associated pneumonia) Active Problems:   Long term (current) use of anticoagulants [Z79.01]   S/P MVR (mitral valve repair)   Dyspnea   S/P Maze operation for atrial fibrillation  Henry Dougherty is a 55 yo previously healthy male with PMHx of atrial fibrillation w RVR s/p MAZE procedure, PFO s/p closure, and Severe MV regurgitation from cord rupture s/p MV repair in April 2017 who presented to the ED with diaphoresis and hypotension and found to have R pleural effusion, possible HCAP and afib with RVR.  Atypical Atrial Flutter w RVR s/p MAZE Procedure: Now in sinus rhythm. Done April 2017 in [place], [place]. Patient was discharged on amiodarone 200 mg QD, metoprolol 75 mg BID and Warfarin. Pressures stable in the 120-140/80-90s this morning. Persistently tachycardic in the 130-140s despite amiodarone 200 mg and metoprolol 75 mg restarted this morning. Patient converted to sinus rhythm at 1200.  -Telemetry -Repeat EKG this morning -TSH  -Increase amiodarone to 400 mg BID x 2 days, then amio 200 mg BID x 1 week, then resume 200 mg QD, cannot give amiodarone IV due to allergy -Continue metoprolol 75 mg BID -Metoprolol 5 mg IV Q6H prn HR >120  H/o Severe Mitral Valve Regurgitation s/p MV Repair: Done April 2017 in [place], [place]. Patient was on Warfarin 5 mg Su, T, R, Sa and 10 mg on MWF. INR on presentation supratherapeutic at 5.4>3.58. -Holding Coumadin -Repeat INR tomorrowch -Repeat Echocardiogram  Elevated troponins: Likely secondary to tachycardia. Patient denies chest pain. No ischemic ST-T wave changes. Per report, no history of CAD as evaluated by cath in 2017. -ASA 81 mg daily -Atorvastatin 40 mg daily -Repeat EKG this am -Echocardiogram  HTN: Normotensive  120-140s/80-90s. On amlodipine 10 mg daily, losartan 50 mg QD, and metoprolol 75 mg BID at home.  -Holding home amlodipine and losartan -On metoprolol and amiodarone as above  Signed: Martyn Malay, DO PGY-2 Internal Medicine Resident Pager # [phone] 03/30/2016 12:28 PM

## 2016-03-30 NOTE — Progress Notes (Signed)
  Echocardiogram 2D Echocardiogram has been performed.  Henry Dougherty 03/30/2016, 4:00 PM

## 2016-03-30 NOTE — Progress Notes (Addendum)
ANTICOAGULATION CONSULT NOTE - Initial Consult  Pharmacy Consult for heparin Indication: atrial fibrillation when INR <2  Allergies  Allergen Reactions  . Amiodarone     Blisters and whelps to IV FORM.    Patient Measurements: Height: 5\' 10"  (177.8 cm) Weight: (!) 330 lb 7.5 oz (149.9 kg) IBW/kg (Calculated) : 73 Heparin Dosing Weight: 109kg  Vital Signs: Temp: 98 F (36.7 C) (05/17 1525) Temp Source: Oral (05/17 1525) BP: 122/105 mmHg (05/17 1530) Pulse Rate: 102 (05/17 1530)  Labs:  Recent Labs  03/29/16 1151 03/29/16 1250 03/29/16 1630 03/29/16 2315 03/30/16 0207  HGB  --  12.5*  --   --  10.7*  HCT  --  38.9*  --   --  32.5*  PLT  --  644*  --   --  525*  LABPROT  --  34.9*  --   --   --   INR 5.4 3.58*  --   --   --   CREATININE  --  2.05*  --   --  1.52*  TROPONINI  --   --  0.12* 0.12* 0.14*    Estimated Creatinine Clearance: 79.7 mL/min (by C-G formula based on Cr of 1.52).  Assessment: 61 YOM admitted with PNA and found to have bacteremia asll. Also with AFib on warfarin PTA, but admitted with INR of 3.58 on 5/16. To start heparin when INR <2. May also have thoracentesis per CCM.  Hgb 10.7, plts 525. No overt bleeding noted. Patient has not received vitamin K or any other reversal products during admission.  Goal of Therapy:  Heparin level 0.3-0.7 units/ml Monitor platelets by anticoagulation protocol: Yes   Plan:  -no heparin for now- start when INR <2 -INR tonight at 1800 to assess status -follow for administration of reversal agents  Mukesh Kornegay D. Markel Kurtenbach, PharmD, BCPS Clinical Pharmacist Pager: [phone] 03/30/2016 4:53 PM   ADDENDUM INR 3.12. No heparin at this time. Daily INR.  Sallie Staron D. Kenly Xiao, PharmD, BCPS Clinical Pharmacist Pager: [phone] 03/30/2016 9:28 PM

## 2016-03-30 NOTE — Progress Notes (Signed)
PULMONARY / CRITICAL CARE MEDICINE   Name: Henry Dougherty MRN: [id number] DOB: 05/26/1961    ADMISSION DATE:  03/29/2016  REFERRING MD:  ER  CHIEF COMPLAINT:  Short of breath  SUBJECTIVE:  Still feels short of breath.  Denies chest pain.  VITAL SIGNS: BP 140/91 mmHg  Pulse 139  Temp(Src) 97.5 F (36.4 C) (Oral)  Resp 26  Ht 5\' 10"  (1.778 m)  Wt 330 lb 7.5 oz (149.9 kg)  BMI 47.42 kg/m2  SpO2 98%  INTAKE / OUTPUT: I/O last 3 completed shifts: In: [id number] [P.O.:200; I.V.:1460] Out: 975 [Urine:975]  PHYSICAL EXAMINATION: General:  pleasant Neuro:  Alert, normal strength HEENT:  No stridor Cardiovascular:  Regular, tachycardic, 2/6 SM, Rt chest incision site clean Lungs:  Rt > Lt crackles Abdomen:  Soft, nontender Musculoskeletal:  No edema Skin:  No rashes  LABS:  BMET  Recent Labs Lab 03/29/16 1250 03/30/16 0207  NA 135 136  K 4.3 4.1  CL 99* 101  CO2 23 22  BUN 40* 34*  CREATININE 2.05* 1.52*  GLUCOSE 115* 165*    Electrolytes  Recent Labs Lab 03/29/16 1250 03/30/16 0207  CALCIUM 9.1 8.5*    CBC  Recent Labs Lab 03/29/16 1250 03/30/16 0207  WBC 22.0* 18.5*  HGB 12.5* 10.7*  HCT 38.9* 32.5*  PLT 644* 525*    Coag's  Recent Labs Lab 03/29/16 1151 03/29/16 1250  INR 5.4 3.58*    Sepsis Markers  Recent Labs Lab 03/29/16 1347 03/29/16 1611 03/29/16 1630  LATICACIDVEN 3.52* 1.94  --   PROCALCITON  --   --  0.50    ABG No results for input(s): PHART, PCO2ART, PO2ART in the last 168 hours.  Liver Enzymes  Recent Labs Lab 03/29/16 1250  AST 51*  ALT 112*  ALKPHOS 107  BILITOT 0.7  ALBUMIN 3.0*    Cardiac Enzymes  Recent Labs Lab 03/29/16 1630 03/29/16 2315 03/30/16 0207  TROPONINI 0.12* 0.12* 0.14*    Glucose  Recent Labs Lab 03/29/16 2004  GLUCAP 151*    Imaging Dg Chest Portable 1 View  03/29/2016  CLINICAL DATA:  Shortness of breath since yesterday. Patient had recent mitro valve repair in  [place]. EXAM: PORTABLE CHEST 1 VIEW COMPARISON:  None. FINDINGS: The mediastinal contour is normal. Heart size enlarged. Mitral valvular replacement ring is noted. There is consolidation of the right mid and lung base. There is a small right pleural effusion. The left lung is clear. No acute abnormalities identified in the visualized bones. IMPRESSION: Consolidation of the right mid and lung base with right pleural effusion for pneumonia. Electronically Signed   By: Abelardo Diesel M.D.   On: 03/29/2016 13:56     STUDIES:   CULTURES: 5/16 Blood >> 5/16 Urine >>  ANTIBIOTICS: 5/16 Vancomycin >>  5/16 Zosyn >>   SIGNIFICANT EVENTS: May 2017 >> Ruptured mitral valve s/p MVR and MAZE with PFO closure in [place] 5/16 Admit 5/17 Cardiology consulted  LINES/TUBES:  DISCUSSION: 55 yo male s/p MVR and MAZE with PFO closure after mitral valve rupture in May 2017 presented with progressive dyspnea and productive cough from HCAP.  ASSESSMENT / PLAN:  PULMONARY A: Acute hypoxic respiratory failure 2nd to HCAP and Rt pleural effusion. P:   Oxygen to keep SpO2 > 92% F/u CXR Assess with bedside u/s  CARDIOVASCULAR A:  Hx of A fib. S/p MVR, MAZE, PFO closure. P:  Restart lopressor Continue amiodarone Cardiology to see   RENAL A:   CKD stage  2. P:   Monitor renal fx, urine outpt  GASTROINTESTINAL A:   Nutrition. P:   Heart healthy diet  HEMATOLOGIC A:   Anemia of critical illness. P:  F/u CBC  INFECTIOUS A:   HCAP. P:   Day 2 of vancomycin, zosyn  ENDOCRINE A:   Hyperglycemia. P:   Monitor blood sugar on BMET  NEUROLOGIC A:   No acute issues. P:   Monitor mental status  Chesley Mires, MD [place] 03/30/2016, 8:42 AM Pager:  [phone] After 3pm call: [phone]

## 2016-03-31 ENCOUNTER — Telehealth: Payer: Self-pay | Admitting: Internal Medicine

## 2016-03-31 ENCOUNTER — Inpatient Hospital Stay (HOSPITAL_COMMUNITY): Payer: BLUE CROSS/BLUE SHIELD

## 2016-03-31 DIAGNOSIS — J9600 Acute respiratory failure, unspecified whether with hypoxia or hypercapnia: Secondary | ICD-10-CM

## 2016-03-31 DIAGNOSIS — Z7901 Long term (current) use of anticoagulants: Secondary | ICD-10-CM

## 2016-03-31 LAB — BASIC METABOLIC PANEL
Anion gap: 12 (ref 5–15)
BUN: 34 mg/dL — ABNORMAL HIGH (ref 6–20)
CALCIUM: 8.5 mg/dL — AB (ref 8.9–10.3)
CO2: 26 mmol/L (ref 22–32)
CREATININE: 1.79 mg/dL — AB (ref 0.61–1.24)
Chloride: 98 mmol/L — ABNORMAL LOW (ref 101–111)
GFR calc Af Amer: 47 mL/min — ABNORMAL LOW (ref >60)
GFR calc non Af Amer: 41 mL/min — ABNORMAL LOW (ref >60)
GLUCOSE: 159 mg/dL — AB (ref 65–99)
Potassium: 4.8 mmol/L (ref 3.5–5.1)
Sodium: 136 mmol/L (ref 135–145)

## 2016-03-31 LAB — PROTIME-INR
INR: 2.95 — ABNORMAL HIGH (ref 0.00–1.49)
PROTHROMBIN TIME: 30.2 s — AB (ref 11.6–15.2)

## 2016-03-31 LAB — CBC
HCT: 32.8 % — ABNORMAL LOW (ref 39.0–52.0)
Hemoglobin: 10.9 g/dL — ABNORMAL LOW (ref 13.0–17.0)
MCH: 31.5 pg (ref 26.0–34.0)
MCHC: 33.2 g/dL (ref 30.0–36.0)
MCV: 94.8 fL (ref 78.0–100.0)
PLATELETS: 702 10*3/uL — AB (ref 150–400)
RBC: 3.46 MIL/uL — ABNORMAL LOW (ref 4.22–5.81)
RDW: 13.3 % (ref 11.5–15.5)
WBC: 22.1 10*3/uL — ABNORMAL HIGH (ref 4.0–10.5)

## 2016-03-31 LAB — URINE CULTURE: CULTURE: NO GROWTH

## 2016-03-31 LAB — TSH: TSH: 2.459 u[IU]/mL (ref 0.350–4.500)

## 2016-03-31 MED ORDER — LORAZEPAM 1 MG PO TABS
1.0000 mg | ORAL_TABLET | Freq: Every evening | ORAL | Status: DC | PRN
  Administered 2016-03-31: 1 mg via ORAL
  Filled 2016-03-31: qty 1

## 2016-03-31 MED ORDER — FUROSEMIDE 10 MG/ML IJ SOLN
40.0000 mg | Freq: Once | INTRAMUSCULAR | Status: AC
  Administered 2016-03-31: 40 mg via INTRAVENOUS
  Filled 2016-03-31: qty 4

## 2016-03-31 MED ORDER — VANCOMYCIN HCL 10 G IV SOLR
1750.0000 mg | INTRAVENOUS | Status: DC
  Administered 2016-03-31: 1750 mg via INTRAVENOUS
  Filled 2016-03-31 (×2): qty 1750

## 2016-03-31 MED ORDER — VANCOMYCIN HCL 10 G IV SOLR
1250.0000 mg | Freq: Two times a day (BID) | INTRAVENOUS | Status: DC
  Filled 2016-03-31: qty 1250

## 2016-03-31 NOTE — Progress Notes (Signed)
ANTICOAGULATION CONSULT NOTE - Follow Up Consult  Pharmacy Consult for heparin Indication: atrial fibrillation when INR <2  Allergies  Allergen Reactions  . Amiodarone     Blisters and whelps to IV FORM.    Patient Measurements: Height: 5\' 10"  (177.8 cm) Weight: (!) 331 lb 2.1 oz (150.2 kg) IBW/kg (Calculated) : 73  Vital Signs: Temp: 98.9 F (37.2 C) (05/18 0753) Temp Source: Oral (05/18 0753) BP: 115/71 mmHg (05/18 0758) Pulse Rate: 88 (05/18 0758)  Labs:  Recent Labs  03/29/16 1250 03/29/16 1630 03/29/16 2315 03/30/16 0207 03/30/16 2024 03/31/16 0405  HGB 12.5*  --   --  10.7*  --  10.9*  HCT 38.9*  --   --  32.5*  --  32.8*  PLT 644*  --   --  525*  --  702*  LABPROT 34.9*  --   --   --  31.6* 30.2*  INR 3.58*  --   --   --  3.12* 2.95*  CREATININE 2.05*  --   --  1.52*  --  1.79*  TROPONINI  --  0.12* 0.12* 0.14*  --   --     Estimated Creatinine Clearance: 67.7 mL/min (by C-G formula based on Cr of 1.79).  Assessment:  55 y/o admitted with PNA. Now with 1/2 blood cx pos with potential MRSA vs CoNS. Pt has MVR and on warfarin PTA for afib. Admit INR 3.58 and now 2.95. Pharmacy consulted for heparin when INR < 2, pt for potential thoracentesis. CBC stable. No bleeding noted.   Goal of Therapy:  Heparin level 0.3-0.7 units/ml Monitor platelets by anticoagulation protocol: Yes   Plan:  No heparin today Recheck INR in AM  Angela Burke, PharmD Pharmacy Resident Pager: [phone] 03/31/2016,10:34 AM

## 2016-03-31 NOTE — Progress Notes (Signed)
Pt wife called , concerned that he was moved so late during the night. Pt's wife states she will make a complaint about pt moving during the middle of the night.

## 2016-03-31 NOTE — Progress Notes (Signed)
PULMONARY / CRITICAL CARE MEDICINE   Name: Henry Dougherty MRN: [id number] DOB: 08-12-1961    ADMISSION DATE:  03/29/2016  REFERRING MD:  ER  CHIEF COMPLAINT:  Short of breath  SUBJECTIVE:  Stable. Still has some dyspnea. Transferred to SDU yesterday.  VITAL SIGNS: BP 109/79 mmHg  Pulse 84  Temp(Src) 98.4 F (36.9 C) (Oral)  Resp 24  Ht 5\' 10"  (1.778 m)  Wt 331 lb 2.1 oz (150.2 kg)  BMI 47.51 kg/m2  SpO2 95%  INTAKE / OUTPUT: I/O last 3 completed shifts: In: [id number] [P.O.:320; I.V.:565; IV Piggyback:600] Out: [id number] [Urine:1655; Emesis/NG output:150]  PHYSICAL EXAMINATION: General:  Awake, oriented, no distress Neuro:  Alert, normal strength, no focal deficits HEENT:  No stridor, no thyromegaly, JVD Cardiovascular:  Regular, tachycardic, 2/6 SM, Rt chest incision site clean Lungs:  Rt crackles Abdomen:  Soft, NT, ND Musculoskeletal:  No edema Skin:  No rashes  LABS:  BMET  Recent Labs Lab 03/29/16 1250 03/30/16 0207 03/31/16 0405  NA 135 136 136  K 4.3 4.1 4.8  CL 99* 101 98*  CO2 23 22 26   BUN 40* 34* 34*  CREATININE 2.05* 1.52* 1.79*  GLUCOSE 115* 165* 159*    Electrolytes  Recent Labs Lab 03/29/16 1250 03/30/16 0207 03/31/16 0405  CALCIUM 9.1 8.5* 8.5*    CBC  Recent Labs Lab 03/29/16 1250 03/30/16 0207 03/31/16 0405  WBC 22.0* 18.5* 22.1*  HGB 12.5* 10.7* 10.9*  HCT 38.9* 32.5* 32.8*  PLT 644* 525* 702*    Coag's  Recent Labs Lab 03/29/16 1250 03/30/16 2024 03/31/16 0405  INR 3.58* 3.12* 2.95*    Sepsis Markers  Recent Labs Lab 03/29/16 1347 03/29/16 1611 03/29/16 1630  LATICACIDVEN 3.52* 1.94  --   PROCALCITON  --   --  0.50    ABG No results for input(s): PHART, PCO2ART, PO2ART in the last 168 hours.  Liver Enzymes  Recent Labs Lab 03/29/16 1250  AST 51*  ALT 112*  ALKPHOS 107  BILITOT 0.7  ALBUMIN 3.0*    Cardiac Enzymes  Recent Labs Lab 03/29/16 1630 03/29/16 2315 03/30/16 0207  TROPONINI 0.12*  0.12* 0.14*    Glucose  Recent Labs Lab 03/29/16 2004  GLUCAP 151*    Imaging Dg Chest Port 1 View  03/31/2016  CLINICAL DATA:  Respiratory failure. EXAM: PORTABLE CHEST 1 VIEW COMPARISON:  03/29/2016. FINDINGS: Prior cardiac valve replacement. Atrial appendage clip in stable position. Stable cardiomegaly with normal pulmonary vascularity. Persistent right lower lobe atelectasis and or infiltrate. Persistent right pleural effusion. IMPRESSION: 1. Stable cardiomegaly.  Prior cardiac valve replacement. 2. Persistent right lower lobe infiltrate and right pleural effusion without significant interim change . Electronically Signed   By: Marcello Moores  Register   On: 03/31/2016 07:26     STUDIES:   CULTURES: 5/16 Blood >> 1/2 coag negative staph. 5/16 Urine >>  ANTIBIOTICS: 5/16 Vancomycin >>  5/16 Zosyn >>   SIGNIFICANT EVENTS: May 2017 >> Ruptured mitral valve s/p MVR and MAZE with PFO closure in [place] 5/16 Admit 5/17 Cardiology consulted  LINES/TUBES:  DISCUSSION: 55 yo male s/p MVR and MAZE with PFO closure after mitral valve rupture in May 2017 presented with progressive dyspnea and productive cough from HCAP.  ASSESSMENT / PLAN:  PULMONARY A: Acute hypoxic respiratory failure 2nd to HCAP and Rt pleural effusion. P:   Oxygen to keep SpO2 > 92% No pleural fluid to tap by ultrasound  CARDIOVASCULAR A:  Hx of A fib. S/p MVR, MAZE,  PFO closure. P:  Restarted on lopressor Continue amiodarone, Lasix per cardiology Scheduled for TEE tomorrow  RENAL A:   CKD stage 2. P:   Monitor renal fx, urine outpt  GASTROINTESTINAL A:   Nutrition. P:   Heart healthy diet  HEMATOLOGIC A:   Anemia of critical illness. P:  F/u CBC  INFECTIOUS A:   HCAP. 1/2 BC with Coag neg staph. ? contaminant P:   Day 3 of vancomycin, zosyn Repeat Bcx today  ENDOCRINE A:   Hyperglycemia. P:   Monitor blood sugar on BMET  NEUROLOGIC A:   No acute issues. P:   Monitor  mental status.  Patient and wife updated at bedside 5/18.  Marshell Garfinkel MD [facility] Pager [phone] If no answer or after 3pm call: [phone] 03/31/2016, 11:18 AM

## 2016-03-31 NOTE — Telephone Encounter (Signed)
New message     This pt is the hospital, but the hospitalist who is seeing him is stating the records from [facility] in [place] was sent a couple of weeks but nothing is in [place] system we are trying to track down the records.

## 2016-03-31 NOTE — Progress Notes (Signed)
HISTORY: [name] is a 55 yo previously healthy male with PMHx of atrial fibrillation w RVR s/p MAZE procedure, PFO s/p closure, and Severe MV regurgitation from cord rupture s/p MV repair in April 2017 who was admitted on 5/17 with acute hypoxic respiratory failure 2/2 HCAP, R pleural effusion and atrial fluter with RVR. Patient is now in sinus rhythm. Blood cx were positive for coag negative staph in 1/2 bottles yesterday.  SUBJECTIVE:   Patient was seen and examined this morning. His breathing is greatly improved, but still experiences dyspnea after exertion. He denies chest pain. He did experience nausea with vomiting yesterday that he attributes to indigestion.   OBJECTIVE:   Vitals:   Filed Vitals:   03/31/16 0700 03/31/16 0753 03/31/16 0758 03/31/16 1100  BP: 115/76 115/71 115/71 109/79  Pulse: 85 89 88 84  Temp:  98.9 F (37.2 C)  98.4 F (36.9 C)  TempSrc:  Oral  Oral  Resp:  22 20 24   Height:      Weight:      SpO2: 93% 94% 94% 95%   I&O's:    Intake/Output Summary (Last 24 hours) at 03/31/16 1237 Last data filed at 03/31/16 1104  Gross per 24 hour  Intake    840 ml  Output    680 ml  Net    160 ml   TELEMETRY: Reviewed telemetry pt in sinus rhythm, HR 80s. 4 beat run of NSVT.  PHYSICAL EXAM General: Vital signs reviewed.  Patient is obese, in no acute distress and cooperative with exam.  Cardiovascular: RRR, S1 normal, S2 normal, no murmurs, gallops, or rubs. Pulmonary/Chest: Decreased breath sound bilaterally, inspiratory crackles in lower right lung field. No wheezing or rhonchi. Abdominal: Soft, non-tender, non-distended, BS +, obese.  Extremities: 1+ lower extremity edema bilaterally Neurological: Awake and alert. Skin: Warm, dry and intact. Psychiatric: Normal mood and affect. speech and behavior is normal.   LABS: Basic Metabolic Panel:  Recent Labs  03/30/16 0207 03/31/16 0405  NA 136 136  K 4.1 4.8  CL 101 98*  CO2 22 26  GLUCOSE 165* 159*   BUN 34* 34*  CREATININE 1.52* 1.79*  CALCIUM 8.5* 8.5*   Liver Function Tests:  Recent Labs  03/29/16 1250  AST 51*  ALT 112*  ALKPHOS 107  BILITOT 0.7  PROT 7.2  ALBUMIN 3.0*   CBC:  Recent Labs  03/30/16 0207 03/31/16 0405  WBC 18.5* 22.1*  HGB 10.7* 10.9*  HCT 32.5* 32.8*  MCV 92.6 94.8  PLT 525* 702*   Cardiac Enzymes:  Recent Labs  03/29/16 1630 03/29/16 2315 03/30/16 0207  TROPONINI 0.12* 0.12* 0.14*   Thyroid Function Tests:  Recent Labs  03/31/16 0405  TSH 2.459   Coag Panel:   Lab Results  Component Value Date   INR 2.95* 03/31/2016   INR 3.12* 03/30/2016   INR 3.58* 03/29/2016    RADIOLOGY: Dg Chest Port 1 View  03/31/2016  CLINICAL DATA:  Respiratory failure. EXAM: PORTABLE CHEST 1 VIEW COMPARISON:  03/29/2016. FINDINGS: Prior cardiac valve replacement. Atrial appendage clip in stable position. Stable cardiomegaly with normal pulmonary vascularity. Persistent right lower lobe atelectasis and or infiltrate. Persistent right pleural effusion. IMPRESSION: 1. Stable cardiomegaly.  Prior cardiac valve replacement. 2. Persistent right lower lobe infiltrate and right pleural effusion without significant interim change . Electronically Signed   By: Marcello Moores  Register   On: 03/31/2016 07:26   Dg Chest Portable 1 View  03/29/2016  CLINICAL DATA:  Shortness of breath since yesterday. Patient had recent mitro valve repair in [place]. EXAM: PORTABLE CHEST 1 VIEW COMPARISON:  None. FINDINGS: The mediastinal contour is normal. Heart size enlarged. Mitral valvular replacement ring is noted. There is consolidation of the right mid and lung base. There is a small right pleural effusion. The left lung is clear. No acute abnormalities identified in the visualized bones. IMPRESSION: Consolidation of the right mid and lung base with right pleural effusion for pneumonia. Electronically Signed   By: Abelardo Diesel M.D.   On: 03/29/2016 13:56   ASSESSMENT/PLAN:   Mr.  Henry Dougherty is a 55 yo previously healthy male with PMHx of atrial fibrillation w RVR s/p MAZE procedure, PFO s/p closure, and Severe MV regurgitation from cord rupture s/p MV repair in April 2017 who was admitted on 5/17 with acute hypoxic respiratory failure 2/2 HCAP, R pleural effusion and atrial flutter with RVR.   Atypical Atrial Flutter w RVR s/p MAZE Procedure 4/17: Now in sinus rhythm, HR in the 80s. TSH normal. Normotensive.   -Telemetry -Amiodarone to 400 mg BID, will decrease to 200 mg BID in the next several days.  -Metoprolol 75 mg BID -Metoprolol 5 mg IV Q6H prn HR >120  H/o Severe Mitral Valve Regurgitation s/p MV Repair: Done April 2017 in [place], [place]. INR 2.95 this morning. On TTE, mitral valve prothesis appeared to functioning normal and intact.  -Holding Coumadin -Heparin per pharmacy once INR <2 -Repeat INR tomorrowc  Large Pericardial Effusion: TTE showed large pericardial effusion, posterior. 3.1 cm along left ventricular free wall, fibrinous, but no hemodynamic compromise, IVC dilated. Possibly due to recent surgery. Will get hospital records and further evaluation with TEE. May need pericardiocentesis versus window.  -TEE -Obtain hospital records  HFrEF: Likely 2/2 Valvular Cardiomyopathy. TTE revealed EF 30-35%, diffuse hypokinesis. Per report, no history of CAD as evaluated by cath in 2017. May be under-estimated due to recent aflutter with RVR or due to history of mitral valve regurgitation.  -ASA 81 mg daily -Atorvastatin 40 mg daily -TEE tomorrow -Lasix 40 mg IV once -Monitor volume status and kidney function -Amlodipine 10 mg daily -Will need medication adjustment for HFrEF as able  Coag Negative Staph: Positive BCx in 1/2 with coag negative staph. Possible contaminant. Given recent cardiac surgery, will evaluate for endocarditis with TEE. Afebrile, WBC 22.  -Vanc, Zosyn -TEE, NPO midnight -Repeat BCx -Further management per primary  HTN:  Normotensive. -On metoprolol and amiodarone as above -Amlodipine 10 mg daily  Acute Hypoxic Respiratory Failure 2/2 HCAP and Rt Pleural Effusion: Dyspnea improved. -Per PCCM -No pleural fluid to tap by ultrasound  Martyn Malay, DO PGY-2 Internal Medicine Resident Pager # [phone] 03/31/2016 12:37 PM

## 2016-03-31 NOTE — Plan of Care (Signed)
Problem: Phase I Progression Outcomes Goal: Progress activity as tolerated unless otherwise ordered Outcome: Progressing Tolerates current activity level. Ambulates independently to BR and OOB in Chair.   Problem: Discharge Progression Outcomes Goal: O2 sats > or equal 90% or at baseline Outcome: [place]. SOB on Exertion improved.

## 2016-04-01 ENCOUNTER — Encounter (HOSPITAL_COMMUNITY): Payer: Self-pay | Admitting: *Deleted

## 2016-04-01 ENCOUNTER — Inpatient Hospital Stay (HOSPITAL_COMMUNITY): Payer: BLUE CROSS/BLUE SHIELD

## 2016-04-01 ENCOUNTER — Encounter (HOSPITAL_COMMUNITY)
Admission: EM | Disposition: A | Discharge status: Home / Self Care | Payer: Self-pay | Source: Home / Self Care | Attending: Internal Medicine

## 2016-04-01 ENCOUNTER — Telehealth: Payer: Self-pay | Admitting: Cardiology

## 2016-04-01 DIAGNOSIS — I313 Pericardial effusion (noninflammatory): Secondary | ICD-10-CM

## 2016-04-01 DIAGNOSIS — I5043 Acute on chronic combined systolic (congestive) and diastolic (congestive) heart failure: Secondary | ICD-10-CM

## 2016-04-01 DIAGNOSIS — I351 Nonrheumatic aortic (valve) insufficiency: Secondary | ICD-10-CM

## 2016-04-01 DIAGNOSIS — I319 Disease of pericardium, unspecified: Secondary | ICD-10-CM

## 2016-04-01 DIAGNOSIS — I428 Other cardiomyopathies: Secondary | ICD-10-CM

## 2016-04-01 DIAGNOSIS — J9601 Acute respiratory failure with hypoxia: Secondary | ICD-10-CM

## 2016-04-01 HISTORY — PX: TEE WITHOUT CARDIOVERSION: SHX5443

## 2016-04-01 LAB — MAGNESIUM: Magnesium: 2.2 mg/dL (ref 1.7–2.4)

## 2016-04-01 LAB — CBC
HEMATOCRIT: 30.7 % — AB (ref 39.0–52.0)
Hemoglobin: 10 g/dL — ABNORMAL LOW (ref 13.0–17.0)
MCH: 29.9 pg (ref 26.0–34.0)
MCHC: 32.6 g/dL (ref 30.0–36.0)
MCV: 91.6 fL (ref 78.0–100.0)
PLATELETS: 566 10*3/uL — AB (ref 150–400)
RBC: 3.35 MIL/uL — ABNORMAL LOW (ref 4.22–5.81)
RDW: 13 % (ref 11.5–15.5)
WBC: 18.7 10*3/uL — ABNORMAL HIGH (ref 4.0–10.5)

## 2016-04-01 LAB — CULTURE, BLOOD (ROUTINE X 2)

## 2016-04-01 LAB — HEPARIN LEVEL (UNFRACTIONATED): Heparin Unfractionated: <0.1 IU/mL — ABNORMAL LOW (ref 0.30–0.70)

## 2016-04-01 LAB — BASIC METABOLIC PANEL
Anion gap: 12 (ref 5–15)
BUN: 35 mg/dL — AB (ref 6–20)
CALCIUM: 8.5 mg/dL — AB (ref 8.9–10.3)
CO2: 27 mmol/L (ref 22–32)
Chloride: 98 mmol/L — ABNORMAL LOW (ref 101–111)
Creatinine, Ser: 1.87 mg/dL — ABNORMAL HIGH (ref 0.61–1.24)
GFR calc Af Amer: 45 mL/min — ABNORMAL LOW (ref >60)
GFR, EST NON AFRICAN AMERICAN: 39 mL/min — AB (ref >60)
GLUCOSE: 125 mg/dL — AB (ref 65–99)
Potassium: 3.9 mmol/L (ref 3.5–5.1)
Sodium: 137 mmol/L (ref 135–145)

## 2016-04-01 LAB — VANCOMYCIN, TROUGH: VANCOMYCIN TR: 10 ug/mL (ref 10.0–20.0)

## 2016-04-01 LAB — PROTIME-INR
INR: 2.07 — ABNORMAL HIGH (ref 0.00–1.49)
Prothrombin Time: 23.2 seconds — ABNORMAL HIGH (ref 11.6–15.2)

## 2016-04-01 LAB — PHOSPHORUS: Phosphorus: 4.5 mg/dL (ref 2.5–4.6)

## 2016-04-01 SURGERY — ECHOCARDIOGRAM, TRANSESOPHAGEAL
Anesthesia: Moderate Sedation

## 2016-04-01 MED ORDER — VANCOMYCIN HCL 10 G IV SOLR
1750.0000 mg | INTRAVENOUS | Status: DC
  Filled 2016-04-01: qty 1750

## 2016-04-01 MED ORDER — AMIODARONE HCL 200 MG PO TABS
400.0000 mg | ORAL_TABLET | Freq: Two times a day (BID) | ORAL | Status: DC
  Administered 2016-04-01 – 2016-04-05 (×10): 400 mg via ORAL
  Filled 2016-04-01 (×12): qty 2

## 2016-04-01 MED ORDER — MIDAZOLAM HCL 5 MG/ML IJ SOLN
INTRAMUSCULAR | Status: AC
  Filled 2016-04-01: qty 2

## 2016-04-01 MED ORDER — FENTANYL CITRATE (PF) 100 MCG/2ML IJ SOLN
INTRAMUSCULAR | Status: DC | PRN
  Administered 2016-04-01 (×2): 25 ug via INTRAVENOUS

## 2016-04-01 MED ORDER — LIDOCAINE VISCOUS 2 % MT SOLN
OROMUCOSAL | Status: AC
  Filled 2016-04-01: qty 15

## 2016-04-01 MED ORDER — MIDAZOLAM HCL 10 MG/2ML IJ SOLN
INTRAMUSCULAR | Status: DC | PRN
  Administered 2016-04-01 (×2): 2 mg via INTRAVENOUS

## 2016-04-01 MED ORDER — FUROSEMIDE 10 MG/ML IJ SOLN
40.0000 mg | Freq: Two times a day (BID) | INTRAMUSCULAR | Status: DC
  Administered 2016-04-01: 40 mg via INTRAVENOUS
  Filled 2016-04-01: qty 4

## 2016-04-01 MED ORDER — DIPHENHYDRAMINE HCL 50 MG/ML IJ SOLN
INTRAMUSCULAR | Status: AC
  Filled 2016-04-01: qty 1

## 2016-04-01 MED ORDER — BUTAMBEN-TETRACAINE-BENZOCAINE 2-2-14 % EX AERO
INHALATION_SPRAY | CUTANEOUS | Status: DC | PRN
  Administered 2016-04-01: 2 via TOPICAL

## 2016-04-01 MED ORDER — FENTANYL CITRATE (PF) 100 MCG/2ML IJ SOLN
INTRAMUSCULAR | Status: AC
  Filled 2016-04-01: qty 2

## 2016-04-01 MED ORDER — HEPARIN (PORCINE) IN NACL 100-0.45 UNIT/ML-% IJ SOLN
2600.0000 [IU]/h | INTRAMUSCULAR | Status: DC
  Administered 2016-04-01: 1500 [IU]/h via INTRAVENOUS
  Administered 2016-04-02: 2400 [IU]/h via INTRAVENOUS
  Administered 2016-04-02: 2200 [IU]/h via INTRAVENOUS
  Administered 2016-04-03 – 2016-04-05 (×5): 2400 [IU]/h via INTRAVENOUS
  Filled 2016-04-01 (×11): qty 250

## 2016-04-01 MED ORDER — FUROSEMIDE 10 MG/ML IJ SOLN
80.0000 mg | Freq: Once | INTRAMUSCULAR | Status: AC
  Administered 2016-04-01: 80 mg via INTRAVENOUS
  Filled 2016-04-01: qty 8

## 2016-04-01 MED ORDER — VANCOMYCIN HCL 10 G IV SOLR
1250.0000 mg | Freq: Two times a day (BID) | INTRAVENOUS | Status: DC
  Administered 2016-04-01 – 2016-04-03 (×5): 1250 mg via INTRAVENOUS
  Filled 2016-04-01 (×6): qty 1250

## 2016-04-01 NOTE — Telephone Encounter (Signed)
Received records from [facility] for appointment with Kerin Ransom, [place] on 04/13/16.  Records given to [employer] (medical records) for Luke's schedule on 04/13/16. lp

## 2016-04-01 NOTE — Progress Notes (Signed)
Pharmacy Antibiotic Note  Henry Dougherty is a 55 y.o. male admitted on 03/29/2016 with HCAP.  Pharmacy has been consulted for Vanc/Zosyn dosing. Pt growing CoNS in 1/2 blood cx and has MVR. For TEE today. VT scheduled for today. Normalized CrCl ~ 45 mL/min.   Plan: Zosyn 3.375g IV q8h (4 hour infusion).  Vancomycin 1750mg  q 24h F/u renal function, C&S F/u Vanc trough F/u abx plans after TEE  Height: 5\' 10"  (177.8 cm) Weight: (!) 333 lb 1.8 oz (151.1 kg) IBW/kg (Calculated) : 73  Temp (24hrs), Avg:98 F (36.7 C), Min:97.5 F (36.4 C), Max:98.4 F (36.9 C)   Recent Labs Lab 03/29/16 1250 03/29/16 1347 03/29/16 1611 03/30/16 0207 03/31/16 0405 04/01/16 0357  WBC 22.0*  --   --  18.5* 22.1* 18.7*  CREATININE 2.05*  --   --  1.52* 1.79* 1.87*  LATICACIDVEN  --  3.52* 1.94  --   --   --     Estimated Creatinine Clearance: 65 mL/min (by C-G formula based on Cr of 1.87).    Allergies  Allergen Reactions  . Amiodarone     Blisters and whelps to IV FORM.    Antimicrobials this admission: 5/16 cefepime x1 5/16 vanc >    VT on 5/19 5/17 zosyn >  Microbiology results: 5/16 urine cx: ngtd 5/16 blood cx: 1/2 CoNS    BCID staph species, methicillin resistance 5/16 mrsa: neg 5/18 blood cx x 2 >> sent   Thank you for allowing pharmacy to be a part of this patient's care.  Angela Burke, PharmD Pharmacy Resident Pager: [phone] 04/01/2016 8:51 AM

## 2016-04-01 NOTE — Clinical Documentation Improvement (Signed)
Internal Medicine  Can the acuity of HFrEF be further specified? Please document response in next progress note. Thank you!    Acuity - Acute, Chronic, Acute on Chronic  Other  Clinically Undetermined  Document any associated diagnoses/conditions  Supporting Information:  EF 40 to 45% with diffuse hypokinesis  Being treated with IV lasix 40 mg BID  Please exercise your independent, professional judgment when responding. A specific answer is not anticipated or expected.  Thank You,  Zoila Shutter RN, BSN, [place] [phone]: Cell: [phone]

## 2016-04-01 NOTE — Progress Notes (Signed)
ANTICOAGULATION CONSULT NOTE - Follow Up Consult  Pharmacy Consult for heparin Indication: atrial fibrillation with MVR  Allergies  Allergen Reactions  . Amiodarone     Blisters and whelps to IV FORM.    Patient Measurements: Height: 5\' 10"  (177.8 cm) Weight: (!) 333 lb 1.8 oz (151.1 kg) IBW/kg (Calculated) : 73 Heparin dosing weight: 109 kg  Vital Signs: Temp: 97.9 F (36.6 C) (05/19 0717) Temp Source: Oral (05/19 0717) BP: 128/72 mmHg (05/19 0717) Pulse Rate: 82 (05/19 0717)  Labs:  Recent Labs  03/29/16 1630 03/29/16 2315 03/30/16 0207 03/30/16 2024 03/31/16 0405 04/01/16 0357  HGB  --   --  10.7*  --  10.9* 10.0*  HCT  --   --  32.5*  --  32.8* 30.7*  PLT  --   --  525*  --  702* 566*  LABPROT  --   --   --  31.6* 30.2* 23.2*  INR  --   --   --  3.12* 2.95* 2.07*  CREATININE  --   --  1.52*  --  1.79* 1.87*  TROPONINI 0.12* 0.12* 0.14*  --   --   --     Estimated Creatinine Clearance: 65 mL/min (by C-G formula based on Cr of 1.87).  Assessment:  55 y/o admitted with PNA. Pt has MVR and on warfarin PTA for afib. Admit INR 3.58 and now 2.07. Pharmacy consulted for heparin when INR < 2 but per cardiology will start heparin now. CBC stable. No bleeding noted.   Goal of Therapy:  Heparin level 0.3-0.7 units/ml Monitor platelets by anticoagulation protocol: Yes   Plan:  Heparin gtt at 1500 units/hr 6 hr HL Daily HL, CBC Monitor for S&S of bleed  Angela Burke, PharmD Pharmacy Resident Pager: [phone] 04/01/2016,8:31 AM

## 2016-04-01 NOTE — Interval H&P Note (Signed)
History and Physical Interval Note:  04/01/2016 8:34 AM  Henry Dougherty  has presented today for surgery, with the diagnosis of BACTEREMIA  The various methods of treatment have been discussed with the patient and family. After consideration of risks, benefits and other options for treatment, the patient has consented to  Procedure(s): TRANSESOPHAGEAL ECHOCARDIOGRAM (TEE) (N/A) as a surgical intervention .  The patient's history has been reviewed, patient examined, no change in status, stable for surgery.  I have reviewed the patient's chart and labs.  Questions were answered to the patient's satisfaction.     [employer]

## 2016-04-01 NOTE — CV Procedure (Addendum)
    TEE - ?endocarditis, recent MV repair  During this procedure the patient is administered a total of Versed 4 mg and Fentanyl 50 mcg to achieve and maintain moderate conscious sedation.  The patient's heart rate, blood pressure, and oxygen saturation are monitored continuously during the procedure. The period of conscious sedation is 15 minutes, of which I was present face-to-face 100% of this time.   MV - normal repair with annuloplasty ring intact, trace to mild MR, NO vegetations  LV - EF 40-45% (prior ECHO read 30-35%)  TV - moderate TR  AV - mild AI  Pericardial effusion (old fibrinous appearing) mostly posterior moderate in size.   No Vegetations detected.    Candee Furbish, MD

## 2016-04-01 NOTE — H&P (View-Only) (Signed)
    OFFICE NOTE  Chief Complaint:  DOE, sweating, fatigue  Primary Care Physician: Pcp Not In System  HPI:  Henry Dougherty is a 55 y.o. male who I briefly saw today as the doctor of the day in the office. I was called to the Coumadin clinic by one of our pharmacists who noted that the patient was profusely diaphoretic, progressively short of breath and fatigue. I briefly reviewed paperwork as Henry Dougherty has not yet established as a patient of our practice. It turns out he recently had what sounds like acute severe mitral regurgitation secondary to a ruptured mitral valve cord and underwent mitral valve repair. He also had atrial fibrillation and underwent a MAZE procedure as well as clipping of the left atrial appendage and closure of the PFO. 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 He was accompanied today by his wife, daughter and granddaughter. Henry Dougherty initially looked quite ill, pale and diaphoretic and short of breath. Blood pressure was notably hypotensive. He did not have fever. I discussed options with him but felt that he should present to the emergency department for further workup. I'm concerned about a pneumonia and/or possibly bacteremia or sepsis given his recent surgery.   PMHx:  Past Medical History  Diagnosis Date  . Environmental allergies     Past Surgical History  Procedure Laterality Date  . Mitral valve repair    . Cardiac surgery      FAMHx:  No family history on file.  SOCHx:   reports that he has never smoked. He does not have any smokeless tobacco history on file. He reports that he drinks alcohol. He reports that he does not use illicit drugs.  ALLERGIES:  No Known Allergies  ROS: Review of systems not obtained due to patient factors.  HOME MEDS: No current outpatient prescriptions on file.   No current facility-administered medications for this visit.    LABS/IMAGING: No results found  for this or any previous visit (from the past 48 hour(s)). No results found.  WEIGHTS: Wt Readings from Last 3 Encounters:  No data found for Wt    VITALS: There were no vitals taken for this visit.  EXAM: General appearance: alert, mild distress and moderately obese Lungs: diminished breath sounds RLL and rales RLL Heart: regular rate and rhythm Extremities: extremities normal, atraumatic, no cyanosis or edema Skin: Diffuse ecchymosis Neurologic: Grossly normal  EKG: Deferred  ASSESSMENT: 1. Arterial hypotension 2. Progressive DOE 3. Recent Mitral valve repair/MAZE/PFO closure and LAA ligation 4. PAF on warfarin - supratherapeutic INR  PLAN: 1.   Henry Dougherty was seen acutely today during a Coumadin clinic visit at which time a quick assessment reveals significant hypotension, progressive shortness of breath, decreased breath sounds particularly the right lower lobe and signs and symptoms concerning for possible infection. He is hypotensive and diaphoretic. His INR is supratherapeutic on warfarin. Given his recent surgery and instrumentation, be concerned about possible infection. I've advised him to proceed to the emergency department. This will allow [place] to more quickly workup his symptoms. As he is a new patient to our practice I'm happy to see him in follow-up since we have already established a relationship.  Pixie Casino, MD, [facility] Attending Cardiologist [place] 03/29/2016, 1:15 PM

## 2016-04-01 NOTE — Progress Notes (Signed)
ANTICOAGULATION AND ANTIBIOTIC CONSULT NOTE - Follow Up Consult  Pharmacy Consult for heparin; vancomycin Indication: atrial fibrillation;   Allergies  Allergen Reactions  . Amiodarone     Blisters and whelps to IV FORM.    Patient Measurements: Height: 5\' 10"  (177.8 cm) Weight: (!) 333 lb 1.8 oz (151.1 kg) IBW/kg (Calculated) : 73 Heparin dosing weight: 109 kg  Vital Signs: Temp: 97.7 F (36.5 C) (05/19 1500) Temp Source: Oral (05/19 1500) BP: 135/61 mmHg (05/19 1600) Pulse Rate: 90 (05/19 1600)  Labs:  Recent Labs  03/29/16 2315  03/30/16 0207 03/30/16 2024 03/31/16 0405 04/01/16 0357 04/01/16 1549  HGB  --   < > 10.7*  --  10.9* 10.0*  --   HCT  --   --  32.5*  --  32.8* 30.7*  --   PLT  --   --  525*  --  702* 566*  --   LABPROT  --   --   --  31.6* 30.2* 23.2*  --   INR  --   --   --  3.12* 2.95* 2.07*  --   HEPARINUNFRC  --   --   --   --   --   --  <0.10*  CREATININE  --   --  1.52*  --  1.79* 1.87*  --   TROPONINI 0.12*  --  0.14*  --   --   --   --   < > = values in this interval not displayed.  Estimated Creatinine Clearance: 65 mL/min (by C-G formula based on Cr of 1.87).  Assessment: 55 y/o admitted with PNA. Pt on warfarin PTA for afib. Admit INR 3.58 and now 2.07. Pharmacy consulted for heparin drip when INR < 2.  HL is <0.1 on 1500 units/hr. CBC stable. No bleeding noted.   VT is subtherapeutic at 10 for sepsis/PNA. SCr slightly improved to 1.87 from 2.05; UOP improved today at 1 ml/kg/hr. WBC 18.7, afebrile.    Goal of Therapy:  Heparin level 0.3-0.7 units/ml Monitor platelets by anticoagulation protocol: Yes   Plan:  Increase heparin drip to 1900 units/hr 6 hr HL Daily HL and CBC Monitor for s/sx of bleeding  Increase vancomycin to 1250 mg IV q12h, goal 15-20 mcg/ml Level at steady-state if continued Monitor renal function   [facility], [place].D., BCPS Clinical Pharmacist Pager: [phone] 04/01/2016 5:01 PM

## 2016-04-01 NOTE — Progress Notes (Signed)
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 fib with RVR status post maze procedure as well as clipping of the left atrial appendage, recently returned home to [place] on 5/13, was seen in Coumadin clinic on 5/16 and was found to be dyspneic, lethargic and was sent to the emergency room. He was admitted initially to ICU due to significant dyspnea and hypotension, initial chest x-ray showed large right effusion with potential consolidation, meaning SIRS criteria, had elevated lactic acid and was admitted with sepsis due to presumed HCAP. 2-D echo done on 5/17 showed an ejection fraction of 30-35% and also showed a large pericardial effusion without hemodynamic compromise. Cardiology was consulted. He was also started on diuresis. In addition to all this, his blood cultures returned positive for coag-negative staph 1 out of 2 bottles. He did well and improved on antibiotics, and was transferred to the hospitalist service on 5/19.  Assessment & Plan: Principal Problem:   HCAP (healthcare-associated pneumonia) Active Problems:   Atrial flutter with rapid ventricular response ([place])   Long term (current) use of anticoagulants [Z79.01]   S/P MVR (mitral valve repair)   Hypotension (arterial)   Dyspnea   S/P Maze operation for atrial fibrillation   Gram-positive cocci bacteremia - MRSA   Acute respiratory failure (HCC)   Acute on chronic systolic CHF - Plan to obtain records from previous hospitalization, it is not clear if his depressed ejection fraction is new. May be related to recent surgery. Diuresis per cardiology.  - discussed with Dr. Ellyn Hack this morning - It appears to be improving based on the TEE  PAF status post MAZE procedure and  LAA closure - Currently maintaining sinus rhythm. Tele  - On amiodarone 400 twice a day  Severe MR s/p MV repair 5/14 - TEE as below, looks good  Coag negative staph bacteremia - 1 out of 2 bottles only, this may be a contaminant however given recent extensive intracardiac surgery consulted infectious disease. Appreciate input. - Repeat blood cultures obtained on 5/18 no growth to date - Patient underwent a TEE today, no vegetations seen  Sepsis due to HCAP - On admission patient was hypothermic, tachycardic, hypotensive and with a chest x-ray with infiltrate and pleural effusion he was started on treatment for healthcare associated pneumonia given recent prolonged hospitalization with vancomycin and Zosyn - Consulted infectious disease today to evaluate for bacteremia, appreciate input - Patient denied to me today fever or productive cough at home, he states that he has had night sweats since his procedure but no frank fevers. I wonder whether his presentation was related to a true infection or not. ID to weigh in, may not be unreasonable to finish a course of treatment  Acute hypoxic respiratory failure - Likely multifactorial in the setting of congestive heart failure, significant right-sided pleural effusion and potentially pericardial effusion - Treat underlying issues, wean off his oxygen as tolerated  Leukocytosis - likely due in possible   Pericardial effusion - ? Postoperative   HTN - BP controlled, continue current regimen   AKI - unknown baseline renal function - monitor with diuresis   DVT prophylaxis: Coumadin, now on hold Code Status: Full Family Communication: Extensive d/w wife bedside Disposition Plan: TBD  Consultants:  Cardiology   ID  Procedures:   2D echo 5/17 Study Conclusions - Left ventricle: The cavity size was normal. There was moderate concentric hypertrophy. Systolic function was moderately to severely reduced. The estimated ejection  fraction was in the range of 30% to 35%. Diffuse hypokinesis. - Mitral valve: Prior procedures included surgical repair. An annular ring prosthesis was present and functioning normally. Valve area by continuity equation (using LVOT flow): 1.41 cm^2. - Left atrium: The atrium was moderately dilated. - Right atrium: The atrium was mildly dilated. - Pericardium, extracardiac: A large pericardial effusion was identified posterior to the heart 3.1cm and along the left ventricular free wall. The fluid exhibited a fibrinous appearance. There was no evidence of hemodynamic compromise (no RV or RA collpase) however IVC is dilated.   TEE 5/19 Study Conclusions - Left ventricle: Systolic function was mildly to moderately reduced. The estimated ejection fraction was in the range of 40% to 45%. Diffuse hypokinesis. - Aortic valve: No evidence of vegetation. There was mild regurgitation. - Mitral valve: Prior procedures included surgical repair. An annular ring prosthesis was present and functioning normally. No evidence of vegetation. There was trivial to mild regurgitation. - Right atrium: No evidence of thrombus in the atrial cavity or appendage. - Atrial septum: No defect or patent foramen ovale was identified (had prior repair). - Tricuspid valve: No evidence of vegetation. No evidence of vegetation. There was moderate regurgitation. - Pulmonic valve: No evidence of vegetation. - Pericardium, extracardiac: A moderate pericardial effusion was identified posterior to the heart and along the left ventricular free wall. The fluid exhibited a fibrinous appearance.There was no evidence of hemodynamic compromise.  Antimicrobials:  Vancomycin 5/16 >>  Zosyn 5/16 >>   Subjective: - ongoing dyspnea, no chest pain, no palpitations  Objective: Filed Vitals:   04/01/16 0915 04/01/16 0920 04/01/16 0930 04/01/16 1000  BP: 144/52 127/94 138/65   Pulse: 87 87 80 89  Temp:      TempSrc:      Resp: 18 18 18 19    Height:      Weight:      SpO2: 97% 96% 93% 95%    Intake/Output Summary (Last 24 hours) at 04/01/16 1202 Last data filed at 04/01/16 0820  Gross per 24 hour  Intake   1160 ml  Output   1400 ml  Net   -240 ml   Filed Weights   03/30/16 0600 03/31/16 0100 04/01/16 0349  Weight: 149.9 kg (330 lb 7.5 oz) 150.2 kg (331 lb 2.1 oz) 151.1 kg (333 lb 1.8 oz)    Examination: Constitutional: NAD Filed Vitals:   04/01/16 0915 04/01/16 0920 04/01/16 0930 04/01/16 1000  BP: 144/52 127/94 138/65   Pulse: 87 87 80 89  Temp:      TempSrc:      Resp: 18 18 18 19   Height:      Weight:      SpO2: 97% 96% 93% 95%   Eyes: PERRL, lids and conjunctivae normal ENMT: Mucous membranes are moist. No oropharyngeal exudates Respiratory: clear to auscultation bilaterally, no wheezing, decreased breath sounds on right. Normal respiratory effort. No accessory muscle use.  Cardiovascular: Regular rate and rhythm, no MRG. 1+ LE edema. 2+ pedal pulses.   Abdomen: no tenderness. Bowel sounds positive.  Musculoskeletal: no clubbing / cyanosis. No joint deformity upper and lower extremities. No contractures. Normal muscle tone.  Skin: no rashes, lesions, ulcers. No induration. Surgical site right chest healing well Neurologic: CN 2-12 grossly intact. Strength  5/5 in all 4.  Psychiatric: Normal judgment and insight. Alert and oriented x 3. Normal mood.    Data Reviewed: I have personally reviewed following labs and imaging studies  CBC:  Recent Labs Lab 03/29/16 1250 03/30/16 0207 03/31/16 0405 04/01/16 0357  WBC 22.0* 18.5* 22.1* 18.7*  HGB 12.5* 10.7* 10.9* 10.0*  HCT 38.9* 32.5* 32.8* 30.7*  MCV 93.7 92.6 94.8 91.6  PLT 644* 525* 702* 123456*   Basic Metabolic Panel:  Recent Labs Lab 03/29/16 1250 03/30/16 0207 03/31/16 0405 04/01/16 0357  NA 135 136 136 137  K 4.3 4.1 4.8 3.9  CL 99* 101 98* 98*  CO2 23 22 26 27   GLUCOSE 115* 165* 159* 125*  BUN 40* 34* 34* 35*  CREATININE 2.05*  1.52* 1.79* 1.87*  CALCIUM 9.1 8.5* 8.5* 8.5*  MG  --   --   --  2.2  PHOS  --   --   --  4.5   GFR: Estimated Creatinine Clearance: 65 mL/min (by C-G formula based on Cr of 1.87). Liver Function Tests:  Recent Labs Lab 03/29/16 1250  AST 51*  ALT 112*  ALKPHOS 107  BILITOT 0.7  PROT 7.2  ALBUMIN 3.0*   No results for input(s): LIPASE, AMYLASE in the last 168 hours. No results for input(s): AMMONIA in the last 168 hours. Coagulation Profile:  Recent Labs Lab 03/29/16 1151 03/29/16 1250 03/30/16 2024 03/31/16 0405 04/01/16 0357  INR 5.4 3.58* 3.12* 2.95* 2.07*   Cardiac Enzymes:  Recent Labs Lab 03/29/16 1630 03/29/16 2315 03/30/16 0207  TROPONINI 0.12* 0.12* 0.14*   BNP (last 3 results) No results for input(s): PROBNP in the last 8760 hours. HbA1C: No results for input(s): HGBA1C in the last 72 hours. CBG:  Recent Labs Lab 03/29/16 2004  GLUCAP 151*   Lipid Profile: No results for input(s): CHOL, HDL, LDLCALC, TRIG, CHOLHDL, LDLDIRECT in the last 72 hours. Thyroid Function Tests:  Recent Labs  03/31/16 0405  TSH 2.459   Anemia Panel: No results for input(s): VITAMINB12, FOLATE, FERRITIN, TIBC, IRON, RETICCTPCT in the last 72 hours. Urine analysis:    Component Value Date/Time   COLORURINE YELLOW 03/29/2016 2041   APPEARANCEUR HAZY* 03/29/2016 2041   LABSPEC 1.023 03/29/2016 2041   PHURINE 5.5 03/29/2016 2041   GLUCOSEU NEGATIVE 03/29/2016 2041   HGBUR NEGATIVE 03/29/2016 2041   [place] NEGATIVE 03/29/2016 2041   [place] NEGATIVE 03/29/2016 2041   PROTEINUR NEGATIVE 03/29/2016 2041   NITRITE NEGATIVE 03/29/2016 2041   LEUKOCYTESUR NEGATIVE 03/29/2016 2041   Sepsis Labs: Invalid input(s): PROCALCITONIN, LACTICIDVEN  Recent Results (from the past 240 hour(s))  Blood Culture (routine x 2)     Status: None (Preliminary result)   Collection Time: 03/29/16  2:15 PM  Result Value Ref Range Status   Specimen Description BLOOD LEFT  ANTECUBITAL  Final   Special Requests BOTTLES DRAWN AEROBIC AND ANAEROBIC 5CC  Final   Culture NO GROWTH 2 DAYS  Final   Report Status PENDING  Incomplete  Blood Culture (routine x 2)     Status: Abnormal   Collection Time: 03/29/16  2:19 PM  Result Value Ref Range Status   Specimen Description BLOOD RIGHT ANTECUBITAL  Final   Special Requests BOTTLES DRAWN AEROBIC AND ANAEROBIC 5CC  Final   Culture  Setup Time   Final    GRAM POSITIVE COCCI IN CLUSTERS IN BOTH AEROBIC AND ANAEROBIC BOTTLES Organism ID to follow CRITICAL RESULT CALLED TO, READ BACK BY AND VERIFIED WITH:  C. STEWART, PHARM D AT 1409 ON [id number] BY Rhea Bleacher    Culture (A)  Final    STAPHYLOCOCCUS SPECIES (COAGULASE NEGATIVE) THE SIGNIFICANCE OF ISOLATING THIS ORGANISM FROM A SINGLE SET OF BLOOD CULTURES WHEN MULTIPLE SETS ARE DRAWN IS UNCERTAIN. PLEASE NOTIFY THE MICROBIOLOGY DEPARTMENT WITHIN ONE WEEK IF SPECIATION AND SENSITIVITIES ARE REQUIRED.    Report Status 04/01/2016 FINAL  Final  Blood Culture ID Panel (Reflexed)     Status: Abnormal   Collection Time: 03/29/16  2:19 PM  Result Value Ref Range Status   Enterococcus species NOT DETECTED NOT DETECTED Final   Vancomycin resistance NOT DETECTED NOT DETECTED Final   Listeria monocytogenes NOT DETECTED NOT DETECTED Final   Staphylococcus species DETECTED (A) NOT DETECTED Final    Comment: CRITICAL RESULT CALLED TO, READ BACK BY AND VERIFIED WITH: C. STEWART,PHARM D AT 1409 ON [id number] BY S. YARBROUGH    Staphylococcus aureus NOT DETECTED NOT DETECTED Final   Methicillin resistance DETECTED (A) NOT DETECTED Final    Comment: CRITICAL RESULT CALLED TO, READ BACK BY AND VERIFIED WITH: C STEWART, PHARM D AT 1409 ON [id number] BY S. YARBROUGH    Streptococcus species NOT DETECTED NOT DETECTED Final   Streptococcus agalactiae NOT DETECTED NOT DETECTED Final   Streptococcus pneumoniae NOT DETECTED NOT DETECTED Final   Streptococcus pyogenes NOT DETECTED NOT DETECTED Final     Acinetobacter baumannii NOT DETECTED NOT DETECTED Final   Enterobacteriaceae species NOT DETECTED NOT DETECTED Final   Enterobacter cloacae complex NOT DETECTED NOT DETECTED Final   Escherichia coli NOT DETECTED NOT DETECTED Final   Klebsiella oxytoca NOT DETECTED NOT DETECTED Final   Klebsiella pneumoniae NOT DETECTED NOT DETECTED Final   Proteus species NOT DETECTED NOT DETECTED Final   Serratia marcescens NOT DETECTED NOT DETECTED Final   Carbapenem resistance NOT DETECTED NOT DETECTED Final   Haemophilus influenzae NOT DETECTED NOT DETECTED Final   Neisseria meningitidis NOT DETECTED NOT DETECTED Final   Pseudomonas aeruginosa NOT DETECTED NOT DETECTED Final   Candida albicans NOT DETECTED NOT DETECTED Final   Candida glabrata NOT DETECTED NOT DETECTED Final   Candida krusei NOT DETECTED NOT DETECTED Final   Candida parapsilosis NOT DETECTED NOT DETECTED Final   Candida tropicalis NOT DETECTED NOT DETECTED Final  MRSA PCR Screening     Status: None   Collection Time: 03/29/16  5:54 PM  Result Value Ref Range Status   MRSA by PCR NEGATIVE NEGATIVE Final    Comment:        The GeneXpert MRSA Assay (FDA approved for NASAL specimens only), is one component of a comprehensive MRSA colonization surveillance program. It is not intended to diagnose MRSA infection nor to guide or monitor treatment for MRSA infections.   Urine culture     Status: None   Collection Time: 03/29/16  8:41 PM  Result Value Ref Range Status   Specimen Description URINE, CLEAN CATCH  Final   Special Requests NONE  Final   Culture NO GROWTH  Final   Report Status 03/31/2016 FINAL  Final   Radiology Studies: Dg Chest Port 1 View  04/01/2016  CLINICAL DATA:  Respiratory failure. EXAM: PORTABLE CHEST 1 VIEW COMPARISON:  03/31/2016. FINDINGS: Stable cardiomegaly. Persistent right lower lobe atelectasis and/or infiltrate with right pleural effusion. No interim change. No pneumothorax. IMPRESSION: 1.  Persistent right lower lobe atelectasis and/or infiltrate with persistent right pleural effusion. 2.  Persistent unchanged cardiomegaly . Electronically Signed   By: Marcello Moores  Register  On: 04/01/2016 07:27   Dg Chest Port 1 View  03/31/2016  CLINICAL DATA:  Respiratory failure. EXAM: PORTABLE CHEST 1 VIEW COMPARISON:  03/29/2016. FINDINGS: Prior cardiac valve replacement. Atrial appendage clip in stable position. Stable cardiomegaly with normal pulmonary vascularity. Persistent right lower lobe atelectasis and or infiltrate. Persistent right pleural effusion. IMPRESSION: 1. Stable cardiomegaly.  Prior cardiac valve replacement. 2. Persistent right lower lobe infiltrate and right pleural effusion without significant interim change . Electronically Signed   By: Marcello Moores  Register   On: 03/31/2016 07:26   Scheduled Meds: . amLODipine  10 mg Oral q morning - 10a  . antiseptic oral rinse  7 mL Mouth Rinse BID  . aspirin EC  81 mg Oral q morning - 10a  . atorvastatin  40 mg Oral QHS  . furosemide  40 mg Intravenous BID  . metoprolol tartrate  75 mg Oral BID  . pantoprazole  40 mg Oral Q1200  . piperacillin-tazobactam (ZOSYN)  IV  3.375 g Intravenous Q8H  . vancomycin  1,750 mg Intravenous Q24H   Continuous Infusions: . heparin 1,500 Units/hr (04/01/16 1016)   Time spent: 45 minutes, more than 50% bedside in counseling and discussion and answering all family questions, and rest on the floor in direct discussion with cardiology and infectious disease  Marzetta Board, MD, PhD [facility] Pager [phone]  If 7PM-7AM, please contact night-coverage [email or web address] Password [email or web address] 04/01/2016, 12:02 PM

## 2016-04-01 NOTE — Consult Note (Signed)
[facility]  Total days of antibiotics 4        Day 3 piptazo        Day 4 vanco              Reason for Consult: possible malignanct pericardial effusion/MRSE bacteremia   Referring Physician:   Principal Problem:   HCAP (healthcare-associated pneumonia) Active Problems:   Atrial flutter with rapid ventricular response ([place])   Long term (current) use of anticoagulants [Z79.01]   S/P MVR (mitral valve repair)   Hypotension (arterial)   Dyspnea   S/P Maze operation for atrial fibrillation   Gram-positive cocci bacteremia - MRSA   Acute respiratory failure (HCC)    HPI: Henry Dougherty is a 55 y.o. male  Recently had acute severe MR 2/2 ruptured MV cord, underwent MV repair in addition to MAZE to tx Afib and clipping of left atrial appendage & closure of PFO on May 4th, 2017. This was done in [place] where he works during the week. He was hospitlazed for roughly 2 wk, known to have effusion at time of discharge ,hoping to improve with diuretics, as in the past. He returned to [facility] on 5/13  with intent to also do cardiac rehab. He started to have worsening shortness of breath, increase cough especially with deep inhalation, no fevers or worsening nightsweats/chills. He  was evaluated by Dr. Debara Pickett during anticoagulation clinic. At that visit on 5/16, the patient appeared quite ill, pale and diaphoretic and short of breath. Blood pressure was notably hypotensive. He did not have fever.he was sent to the ED for further evaluation. In the ED, he was afebrile, HR 120-130s, though hypotensive, LA 3.5, CXR showed large right effusion +/- consolidation, per my read. Labs showed WBC of 22, and plt 644, procalcitonin 0.5. Blood cx nshowed MRSE in 1 of 2 sets on admit. He was empirically started on vancomycin and piptazo for HCAP. TTE revealed large posterior pericardial effusion. On TEE, no vegetations noted to any of the valves. The pericardial effusion described as  moderate size, posteiror to the hear and exhibited fibrinous appearance. Remains afebrile.  Past Medical History  Diagnosis Date  . Environmental allergies    Allergies:  Allergies  Allergen Reactions  . Amiodarone     Blisters and whelps to IV FORM.    MEDICATIONS: . amiodarone  400 mg Oral BID  . amLODipine  10 mg Oral q morning - 10a  . antiseptic oral rinse  7 mL Mouth Rinse BID  . aspirin EC  81 mg Oral q morning - 10a  . atorvastatin  40 mg Oral QHS  . furosemide  40 mg Intravenous BID  . metoprolol tartrate  75 mg Oral BID  . pantoprazole  40 mg Oral Q1200  . piperacillin-tazobactam (ZOSYN)  IV  3.375 g Intravenous Q8H  . vancomycin  1,750 mg Intravenous Q24H    Social History  Substance Use Topics  . Smoking status: Never Smoker   . Smokeless tobacco: None  . Alcohol Use: Yes    History reviewed. No pertinent family history.  Review of Systems  Constitutional: Negative for fever, chills, diaphoresis, activity change, appetite change, fatigue and unexpected weight change.  HENT: Negative for congestion, sore throat, rhinorrhea, sneezing, trouble swallowing and sinus pressure.  Eyes: Negative for photophobia and visual disturbance.  Respiratory: positive for cough, chest tightness, shortness of breath, wheezing and stridor.  Cardiovascular: Negative for chest pain, palpitations and leg swelling.  Gastrointestinal: Negative  for nausea, vomiting, abdominal pain, diarrhea, constipation, blood in stool, abdominal distention and anal bleeding.  Genitourinary: Negative for dysuria, hematuria, flank pain and difficulty urinating.  Musculoskeletal: Negative for myalgias, back pain, joint swelling, arthralgias and gait problem.  Skin: Negative for color change, pallor, rash and wound.  Neurological: Negative for dizziness, tremors, weakness and light-headedness.  Hematological: Negative for adenopathy. Does not bruise/bleed easily.  Psychiatric/Behavioral: Negative for  behavioral problems, confusion, sleep disturbance, dysphoric mood, decreased concentration and agitation.     OBJECTIVE: Temp:  [97.5 F (36.4 C)-98.2 F (36.8 C)] 98.1 F (36.7 C) (05/19 1205) Pulse Rate:  [80-91] 80 (05/19 1205) Resp:  [14-25] 19 (05/19 1205) BP: (110-155)/(39-107) 119/59 mmHg (05/19 1205) SpO2:  [92 %-99 %] 93 % (05/19 1205) Weight:  [333 lb 1.8 oz (151.1 kg)] 333 lb 1.8 oz (151.1 kg) (05/19 0349) Physical Exam  Constitutional: He is oriented to person, place, and time. He appears well-developed and well-nourished. Obese middle aged male in no distress.  HENT:  echymosis at base of right neck Mouth/Throat: Oropharynx is clear and moist. No oropharyngeal exudate.  Cardiovascular: Normal rate, regular rhythm and normal heart sounds. Exam reveals no gallop and no friction rub.  No murmur heard.  Pulmonary/Chest: Effort normal and breath sounds normal. No respiratory distress. He has no wheezes. Decrease breath sounds on the right base Abdominal: Soft. Bowel sounds are normal. He exhibits no distension. There is no tenderness.  Lymphadenopathy:  He has no cervical adenopathy.  Neurological: He is alert and oriented to person, place, and time.  Skin: Skin is warm and dry. No rash noted. No erythema.  Ext: +1 edema bilaterally Psychiatric: He has a normal mood and affect. His behavior is normal.     LABS: Results for orders placed or performed during the hospital encounter of 03/29/16 (from the past 48 hour(s))  Protime-INR     Status: Abnormal   Collection Time: 03/30/16  8:24 PM  Result Value Ref Range   Prothrombin Time 31.6 (H) 11.6 - 15.2 seconds   INR 3.12 (H) 0.00 - 5.62  Basic metabolic panel     Status: Abnormal   Collection Time: 03/31/16  4:05 AM  Result Value Ref Range   Sodium 136 135 - 145 mmol/L   Potassium 4.8 3.5 - 5.1 mmol/L   Chloride 98 (L) 101 - 111 mmol/L   CO2 26 22 - 32 mmol/L   Glucose, Bld 159 (H) 65 - 99 mg/dL   BUN 34 (H) 6 - 20  mg/dL   Creatinine, Ser 1.79 (H) 0.61 - 1.24 mg/dL   Calcium 8.5 (L) 8.9 - 10.3 mg/dL   GFR calc non Af Amer 41 (L) >60 mL/min   GFR calc Af Amer 47 (L) >60 mL/min    Comment: (NOTE) The eGFR has been calculated using the CKD EPI equation. This calculation has not been validated in all clinical situations. eGFR's persistently <60 mL/min signify possible Chronic Kidney Disease.    Anion gap 12 5 - 15  CBC     Status: Abnormal   Collection Time: 03/31/16  4:05 AM  Result Value Ref Range   WBC 22.1 (H) 4.0 - 10.5 K/uL   RBC 3.46 (L) 4.22 - 5.81 MIL/uL   Hemoglobin 10.9 (L) 13.0 - 17.0 g/dL   HCT 32.8 (L) 39.0 - 52.0 %   MCV 94.8 78.0 - 100.0 fL   MCH 31.5 26.0 - 34.0 pg   MCHC 33.2 30.0 - 36.0 g/dL   RDW  13.3 11.5 - 15.5 %   Platelets 702 (H) 150 - 400 K/uL  Protime-INR     Status: Abnormal   Collection Time: 03/31/16  4:05 AM  Result Value Ref Range   Prothrombin Time 30.2 (H) 11.6 - 15.2 seconds   INR 2.95 (H) 0.00 - 1.49  TSH     Status: None   Collection Time: 03/31/16  4:05 AM  Result Value Ref Range   TSH 2.459 0.350 - 4.500 uIU/mL  Culture, blood (Routine X 2) w Reflex to ID Panel     Status: None (Preliminary result)   Collection Time: 03/31/16 12:21 PM  Result Value Ref Range   Specimen Description BLOOD LEFT HAND    Special Requests IN PEDIATRIC BOTTLE 3CC    Culture NO GROWTH 1 DAY    Report Status PENDING   Culture, blood (Routine X 2) w Reflex to ID Panel     Status: None (Preliminary result)   Collection Time: 03/31/16 12:29 PM  Result Value Ref Range   Specimen Description BLOOD RIGHT HAND    Special Requests IN PEDIATRIC BOTTLE 0.5CC    Culture NO GROWTH 1 DAY    Report Status PENDING   Protime-INR     Status: Abnormal   Collection Time: 04/01/16  3:57 AM  Result Value Ref Range   Prothrombin Time 23.2 (H) 11.6 - 15.2 seconds   INR 2.07 (H) 0.00 - 1.49  CBC     Status: Abnormal   Collection Time: 04/01/16  3:57 AM  Result Value Ref Range   WBC 18.7  (H) 4.0 - 10.5 K/uL   RBC 3.35 (L) 4.22 - 5.81 MIL/uL   Hemoglobin 10.0 (L) 13.0 - 17.0 g/dL   HCT 30.7 (L) 39.0 - 52.0 %   MCV 91.6 78.0 - 100.0 fL   MCH 29.9 26.0 - 34.0 pg   MCHC 32.6 30.0 - 36.0 g/dL   RDW 13.0 11.5 - 15.5 %   Platelets 566 (H) 150 - 400 K/uL  Basic metabolic panel     Status: Abnormal   Collection Time: 04/01/16  3:57 AM  Result Value Ref Range   Sodium 137 135 - 145 mmol/L   Potassium 3.9 3.5 - 5.1 mmol/L    Comment: DELTA CHECK NOTED   Chloride 98 (L) 101 - 111 mmol/L   CO2 27 22 - 32 mmol/L   Glucose, Bld 125 (H) 65 - 99 mg/dL   BUN 35 (H) 6 - 20 mg/dL   Creatinine, Ser 1.87 (H) 0.61 - 1.24 mg/dL   Calcium 8.5 (L) 8.9 - 10.3 mg/dL   GFR calc non Af Amer 39 (L) >60 mL/min   GFR calc Af Amer 45 (L) >60 mL/min    Comment: (NOTE) The eGFR has been calculated using the CKD EPI equation. This calculation has not been validated in all clinical situations. eGFR's persistently <60 mL/min signify possible Chronic Kidney Disease.    Anion gap 12 5 - 15  Magnesium     Status: None   Collection Time: 04/01/16  3:57 AM  Result Value Ref Range   Magnesium 2.2 1.7 - 2.4 mg/dL  Phosphorus     Status: None   Collection Time: 04/01/16  3:57 AM  Result Value Ref Range   Phosphorus 4.5 2.5 - 4.6 mg/dL    MICRO: 5/16 blood cx 1 of 2 sets MRSE 5/18 blood cx pending IMAGING: Dg Chest Port 1 View  04/01/2016  CLINICAL DATA:  Respiratory failure. EXAM: PORTABLE CHEST  1 VIEW COMPARISON:  03/31/2016. FINDINGS: Stable cardiomegaly. Persistent right lower lobe atelectasis and/or infiltrate with right pleural effusion. No interim change. No pneumothorax. IMPRESSION: 1. Persistent right lower lobe atelectasis and/or infiltrate with persistent right pleural effusion. 2.  Persistent unchanged cardiomegaly . Electronically Signed   By: Marcello Moores  Register   On: 04/01/2016 07:27   Dg Chest Port 1 View  03/31/2016  CLINICAL DATA:  Respiratory failure. EXAM: PORTABLE CHEST 1 VIEW  COMPARISON:  03/29/2016. FINDINGS: Prior cardiac valve replacement. Atrial appendage clip in stable position. Stable cardiomegaly with normal pulmonary vascularity. Persistent right lower lobe atelectasis and or infiltrate. Persistent right pleural effusion. IMPRESSION: 1. Stable cardiomegaly.  Prior cardiac valve replacement. 2. Persistent right lower lobe infiltrate and right pleural effusion without significant interim change . Electronically Signed   By: Marcello Moores  Register   On: 03/31/2016 07:26   TTE Study Conclusions - Left ventricle: The cavity size was normal. There was moderate concentric hypertrophy. Systolic function was moderately to severely reduced. The estimated ejection fraction was in the range of 30% to 35%. Diffuse hypokinesis. - Mitral valve: Prior procedures included surgical repair. An annular ring prosthesis was present and functioning normally. Valve area by continuity equation (using LVOT flow): 1.41 cm^2. - Left atrium: The atrium was moderately dilated. - Right atrium: The atrium was mildly dilated. - Pericardium, extracardiac: A large pericardial effusion was identified posterior to the heart 3.1cm and along the left ventricular free wall. The fluid exhibited a fibrinous appearance. There was no evidence of hemodynamic compromise (no RV or RA collpase) however IVC is dilated.   TEE 5/19 Study Conclusions - Left ventricle: Systolic function was mildly to moderately reduced. The estimated ejection fraction was in the range of 40% to 45%. Diffuse hypokinesis. - Aortic valve: No evidence of vegetation. There was mild regurgitation. - Mitral valve: Prior procedures included surgical repair. An annular ring prosthesis was present and functioning normally. No evidence of vegetation. There was trivial to mild regurgitation. - Right atrium: No evidence of thrombus in the atrial cavity or appendage. - Atrial septum: No defect or patent foramen ovale was identified (had prior  repair). - Tricuspid valve: No evidence of vegetation. No evidence of vegetation. There was moderate regurgitation. - Pulmonic valve: No evidence of vegetation. - Pericardium, extracardiac: A moderate pericardial effusion was identified posterior to the heart and along the left ventricular free wall. The fluid exhibited a fibrinous appearance.There was no evidence of hemodynamic compromise.   Assessment/Plan:  55yo M with hx of MV repair, MAZE procedure, Left atrial appendage clipping and PFO repair 2 wks ago presents with shortness of breath, cough, DOE, found to have large effusion to explain shortness of breath but also leukocytosis of 22K, plt elevated in 600s. He had some low BP possibly due to afib with RVR on admit as well. TEE show clear valves but has moderately large effusion that is fibrinous. Infectious work up thus far showing MRSE bacteremia in 1 of 2 sets.  MRSE bacteremia = would treat with vancomycin  ?HCAP = more likely due to effusion and would discontinue piptazo  Pericardial effusion = could also cause shortness of breath, with fibrinous debris concern that he may have infection. See if we can get echo reports for [facility] to see if this was present on their echos. If this is new, would side with treating for 4-6 wk instead of 2, to treat for malignant effusion. Would benefit from gentle diuresis  Ckd= would watch vanco dosing closing  given his CKD. vanco goal of 15-20.  Will see back on Monday, if questions, dr comer available over the weekend.   Elzie Rings [facility] [phone]

## 2016-04-01 NOTE — Progress Notes (Signed)
HISTORY: Henry Dougherty is a 55 yo previously healthy male with PMHx of atrial fibrillation w RVR s/p MAZE procedure, PFO s/p closure, and Severe MV regurgitation from cord rupture s/p MV repair in April 2017 who was admitted on 5/17 with acute hypoxic respiratory failure 2/2 HCAP, R pleural effusion and atrial fluter with RVR. Patient is now in sinus rhythm. Blood cx were positive for coag negative staph in 1/2 bottles yesterday.  SUBJECTIVE:   Patient was seen and examined this morning. He continues to feel more short of breath as if he "has fluid on him." He denies chest discomfort.   OBJECTIVE:   Vitals:   Filed Vitals:   04/01/16 0915 04/01/16 0920 04/01/16 0930 04/01/16 1000  BP: 144/52 127/94 138/65   Pulse: 87 87 80 89  Temp:      TempSrc:      Resp: 18 18 18 19   Height:      Weight:      SpO2: 97% 96% 93% 95%   I&O's:    Intake/Output Summary (Last 24 hours) at 04/01/16 1107 Last data filed at 04/01/16 0820  Gross per 24 hour  Intake   1160 ml  Output   1400 ml  Net   -240 ml   TELEMETRY: Reviewed telemetry pt in sinus rhythm, HR 80s.   PHYSICAL EXAM General: Vital signs reviewed.  Patient is obese, in no acute distress and cooperative with exam.  Cardiovascular: RRR, S1 normal, S2 normal, no murmurs, gallops, or rubs. Pulmonary/Chest: Decreased breath sound bilaterally, inspiratory crackles in lower right lung field. No wheezing or rhonchi.  Abdominal: Soft, non-tender, non-distended, BS normoactive, obese Extremities: 1+ lower extremity edema bilaterally  LABS: Basic Metabolic Panel:  Recent Labs  03/31/16 0405 04/01/16 0357  NA 136 137  K 4.8 3.9  CL 98* 98*  CO2 26 27  GLUCOSE 159* 125*  BUN 34* 35*  CREATININE 1.79* 1.87*  CALCIUM 8.5* 8.5*  MG  --  2.2  PHOS  --  4.5   Liver Function Tests:  Recent Labs  03/29/16 1250  AST 51*  ALT 112*  ALKPHOS 107  BILITOT 0.7  PROT 7.2  ALBUMIN 3.0*   CBC:  Recent Labs  03/31/16 0405  04/01/16 0357  WBC 22.1* 18.7*  HGB 10.9* 10.0*  HCT 32.8* 30.7*  MCV 94.8 91.6  PLT 702* 566*   Cardiac Enzymes:  Recent Labs  03/29/16 1630 03/29/16 2315 03/30/16 0207  TROPONINI 0.12* 0.12* 0.14*   Thyroid Function Tests:  Recent Labs  03/31/16 0405  TSH 2.459   Coag Panel:   Lab Results  Component Value Date   INR 2.07* 04/01/2016   INR 2.95* 03/31/2016   INR 3.12* 03/30/2016    RADIOLOGY: Dg Chest Port 1 View  04/01/2016  CLINICAL DATA:  Respiratory failure. EXAM: PORTABLE CHEST 1 VIEW COMPARISON:  03/31/2016. FINDINGS: Stable cardiomegaly. Persistent right lower lobe atelectasis and/or infiltrate with right pleural effusion. No interim change. No pneumothorax. IMPRESSION: 1. Persistent right lower lobe atelectasis and/or infiltrate with persistent right pleural effusion. 2.  Persistent unchanged cardiomegaly . Electronically Signed   By: Marcello Moores  Register   On: 04/01/2016 07:27   Dg Chest Port 1 View  03/31/2016  CLINICAL DATA:  Respiratory failure. EXAM: PORTABLE CHEST 1 VIEW COMPARISON:  03/29/2016. FINDINGS: Prior cardiac valve replacement. Atrial appendage clip in stable position. Stable cardiomegaly with normal pulmonary vascularity. Persistent right lower lobe atelectasis and or infiltrate. Persistent right pleural effusion. IMPRESSION: 1. Stable cardiomegaly.  Prior cardiac valve replacement. 2. Persistent right lower lobe infiltrate and right pleural effusion without significant interim change . Electronically Signed   By: Marcello Moores  Register   On: 03/31/2016 07:26   Dg Chest Portable 1 View  03/29/2016  CLINICAL DATA:  Shortness of breath since yesterday. Patient had recent mitro valve repair in [place]. EXAM: PORTABLE CHEST 1 VIEW COMPARISON:  None. FINDINGS: The mediastinal contour is normal. Heart size enlarged. Mitral valvular replacement ring is noted. There is consolidation of the right mid and lung base. There is a small right pleural effusion. The left  lung is clear. No acute abnormalities identified in the visualized bones. IMPRESSION: Consolidation of the right mid and lung base with right pleural effusion for pneumonia. Electronically Signed   By: Abelardo Diesel M.D.   On: 03/29/2016 13:56   ASSESSMENT/PLAN:   Henry Dougherty is a 55 yo previously healthy male with PMHx of atrial fibrillation w RVR s/p MAZE procedure, PFO s/p closure, and Severe MV regurgitation from cord rupture s/p MV repair in April 2017 who was admitted on 5/17 with acute hypoxic respiratory failure 2/2 HCAP, R pleural effusion and atrial flutter with RVR.  Principal Problem:   HCAP (healthcare-associated pneumonia) Active Problems:   Atrial flutter with rapid ventricular response (HCC)   S/P MVR (mitral valve repair)   Hypotension (arterial)   Gram-positive cocci bacteremia - MRSA   Long term (current) use of anticoagulants [Z79.01]   S/P Maze operation for atrial fibrillation   Dyspnea   Acute respiratory failure (HCC)   Acute on chronic combined systolic and diastolic heart failure (HCC)   Atypical Atrial Flutter w RVR s/p MAZE Procedure 4/17: Maintaining sinus rhythm, HR in the 80s. Normotensive.   -Telemetry -Continue Amiodarone to 400 mg BID, will decrease to 200 mg BID in the next several days.  -Metoprolol 75 mg BID -Metoprolol 5 mg IV Q6H prn HR >120 - has not required  H/o Severe Mitral Valve Regurgitation s/p MV Repair: Done April 2017 in [place], [place]. INR 2.0 this morning. TEE on 5/19 showed annular ring prosthesis was present and functioning normally without evidence of vegetation. -Holding Coumadin, until no more procedures deemed necessary -Heparin gtt  Large Pericardial Effusion: TEE showed moderate pericardial effusion was posterior to the heart and along the left ventricular free wall. The fluid exhibited a fibrinous appearance.There was no evidence of hemodynamic compromise. We will obtain previous hospital records to determine if effusion was  present on discharge. -Obtain records  HFrEF: Likely 2/2 Valvular Cardiomyopathy. TEE showed EF of 40-45% and diffuse hypokinesis. No history of CAD as evaluated by cath in 2017.  -ASA 81 mg daily -Atorvastatin 40 mg daily -Amlodipine 10 mg daily -Lasix 80 mg IV once -Followed by Lasix 40 mg IV BID starting tonight  ?HCAP and Coag Negative Staph: Positive BCx in 1/2 with coag negative staph. Possible contaminant. TEE showed no evidence of vegetation concerning for endocarditis.  -On Vanc, Zosyn -Repeat BCx pending -Primary to consult ID today -Further management per primary  HTN: Normotensive. -On metoprolol and amiodarone as above -Amlodipine 10 mg daily  Acute Hypoxic Respiratory Failure 2/2 HCAP and Rt Pleural Effusion: Dyspnea improved. -Per PCCM -No pleural fluid to tap by ultrasound -Starting diuresis with Lasix 80 mg IV once followed by Lasix 40 mg IV BID tonight  Martyn Malay, DO PGY-2 Internal Medicine Resident Pager # [phone] 04/01/2016 11:07 AM  I have seen, examined and evaluated the patient this AM on rounds along with Dr.  Burns (R2).  After reviewing all the available data and chart, we discussed the patients laboratory, study & physical findings as well as symptoms in detail. I agree with her findings, examination as well as impression recommendations as per our discussion.    Currently remains dyspneic. Not actively coughing, still has a white count. His chest x-ray probably is consistent with layering effusion and probably some pulmonary edema on the right side. This could all be related to postoperative changes as could be pericardial effusion. He is having a TEE done today which can get a better look at his valve and ventricular function. They can also see the extent of the effusion.  I discussed it with Dr. Roxy Manns again today. He thinks a lot of these changes were seen are probably postop changes. There can be some lung injury from unilateral intubation for the  surgery with right thoracotomy. He is currently in the stepdown unit, if his dyspnea does not improve, would potentially consider asking the current the pulmonary care team to readdress for possible lung injury.  For now I think the best option is continued diuresis. He is on amiodarone and increased dose of 400 mg twice a day. We can return to 200 mg twice a day once he is stabilized. Tolerating metoprolol twice a day. Has not required additional dosing.  His warfarin was held secondary to IV birth therapeutic INR. Now that his INR is down to roughly 2 he is on bridging heparin until we determine whether or not we need to do additional procedures. I suspect we can probably restart warfarin if no additional procedures are indicated.  Reviewing the transesophageal echo demonstrated slightly improved EF from what the transthoracic. This is encouraging. We will try to get the outside records. Having difficulty with that.  Plan for now: Continue amiodarone 4 mg twice a day IV Lasix 80 mg 1 and then 20 mg twice a day based on high filling pressures noted. Continue antibiotics for possible postop pneumonia. May need to consider positive pressure ventilation at least at night to help recruit right lobe expansion.. On statin, beta blocker and cast channel blocker. Not on ACE inhibitor with worsening renal function.  I suspect that the coag negative staph culture positive is probably a  contaminant, however I do agree with getting ID to consider duration of necessary antibiotic coverage based on the fact that he has a newly instrument and mitral valve. No evidence of endocrine is on TEE that we can tell. This may be too soon.    Glenetta Hew, M.D., M.S. Interventional Cardiologist   Pager # [phone] Phone # [phone] [address] [place], [place] [address]

## 2016-04-01 NOTE — Progress Notes (Signed)
  Echocardiogram Echocardiogram Transesophageal has been performed.  Jennette Dubin 04/01/2016, 9:51 AM

## 2016-04-02 DIAGNOSIS — I5021 Acute systolic (congestive) heart failure: Secondary | ICD-10-CM | POA: Insufficient documentation

## 2016-04-02 DIAGNOSIS — R0602 Shortness of breath: Secondary | ICD-10-CM

## 2016-04-02 LAB — BASIC METABOLIC PANEL
Anion gap: 12 (ref 5–15)
BUN: 36 mg/dL — AB (ref 6–20)
CHLORIDE: 97 mmol/L — AB (ref 101–111)
CO2: 29 mmol/L (ref 22–32)
CREATININE: 2.11 mg/dL — AB (ref 0.61–1.24)
Calcium: 8.3 mg/dL — ABNORMAL LOW (ref 8.9–10.3)
GFR calc Af Amer: 39 mL/min — ABNORMAL LOW (ref >60)
GFR calc non Af Amer: 33 mL/min — ABNORMAL LOW (ref >60)
GLUCOSE: 134 mg/dL — AB (ref 65–99)
POTASSIUM: 3.8 mmol/L (ref 3.5–5.1)
SODIUM: 138 mmol/L (ref 135–145)

## 2016-04-02 LAB — CBC
HCT: 31 % — ABNORMAL LOW (ref 39.0–52.0)
HEMOGLOBIN: 10.2 g/dL — AB (ref 13.0–17.0)
MCH: 30.1 pg (ref 26.0–34.0)
MCHC: 32.9 g/dL (ref 30.0–36.0)
MCV: 91.4 fL (ref 78.0–100.0)
Platelets: 542 10*3/uL — ABNORMAL HIGH (ref 150–400)
RBC: 3.39 MIL/uL — AB (ref 4.22–5.81)
RDW: 12.9 % (ref 11.5–15.5)
WBC: 15 10*3/uL — ABNORMAL HIGH (ref 4.0–10.5)

## 2016-04-02 LAB — HEPARIN LEVEL (UNFRACTIONATED)
HEPARIN UNFRACTIONATED: 0.2 [IU]/mL — AB (ref 0.30–0.70)
HEPARIN UNFRACTIONATED: 0.45 [IU]/mL (ref 0.30–0.70)

## 2016-04-02 LAB — PROTIME-INR
INR: 1.73 — ABNORMAL HIGH (ref 0.00–1.49)
Prothrombin Time: 20.3 seconds — ABNORMAL HIGH (ref 11.6–15.2)

## 2016-04-02 MED ORDER — HEPARIN BOLUS VIA INFUSION
3000.0000 [IU] | Freq: Once | INTRAVENOUS | Status: AC
  Administered 2016-04-02: 3000 [IU] via INTRAVENOUS
  Filled 2016-04-02: qty 3000

## 2016-04-02 MED ORDER — WARFARIN SODIUM 10 MG PO TABS
10.0000 mg | ORAL_TABLET | Freq: Once | ORAL | Status: AC
  Administered 2016-04-02: 10 mg via ORAL
  Filled 2016-04-02: qty 1

## 2016-04-02 MED ORDER — WARFARIN - PHARMACIST DOSING INPATIENT
Freq: Every day | Status: DC
  Administered 2016-04-05: 18:00:00

## 2016-04-02 MED ORDER — FUROSEMIDE 10 MG/ML IJ SOLN
40.0000 mg | Freq: Two times a day (BID) | INTRAMUSCULAR | Status: DC
  Administered 2016-04-02 (×2): 40 mg via INTRAVENOUS
  Filled 2016-04-02 (×2): qty 4

## 2016-04-02 NOTE — Progress Notes (Signed)
ANTICOAGULATION CONSULT NOTE - Follow Up Consult  Pharmacy Consult for heparin Indication: atrial fibrillation  Allergies  Allergen Reactions  . Amiodarone     Blisters and whelps to IV FORM.    Patient Measurements: Height: 5\' 10"  (177.8 cm) Weight: (!) 333 lb 1.8 oz (151.1 kg) IBW/kg (Calculated) : 73 Heparin dosing weight: 109 kg  Vital Signs: Temp: 98.7 F (37.1 C) (05/19 2313) Temp Source: Oral (05/19 2313) BP: 111/50 mmHg (05/19 2313) Pulse Rate: 80 (05/19 2313)  Labs:  Recent Labs  03/31/16 0405 04/01/16 0357 04/01/16 1549 04/02/16 0220  HGB 10.9* 10.0*  --  10.2*  HCT 32.8* 30.7*  --  31.0*  PLT 702* 566*  --  542*  LABPROT 30.2* 23.2*  --  20.3*  INR 2.95* 2.07*  --  1.73*  HEPARINUNFRC  --   --  <0.10* 0.20*  CREATININE 1.79* 1.87*  --   --     Estimated Creatinine Clearance: 65 mL/min (by C-G formula based on Cr of 1.87).  Assessment: 55 y/o admitted with PNA. Pt on warfarin PTA for afib. Admit INR 3.58 and now down to 1.73 and on heparin bridge. Heparin level remains subtherapeutic on 1900 units/hr. CBC stable. No issues with line or bleeding reported per RN.  Goal of Therapy:  Heparin level 0.3-0.7 units/ml Monitor platelets by anticoagulation protocol: Yes   Plan:  Increase heparin drip to 2200 units/hr F/u 6 hr HL  Sherlon Handing, PharmD, BCPS Clinical pharmacist, pager [phone] 04/02/2016 2:53 AM

## 2016-04-02 NOTE — Progress Notes (Signed)
Cardiologist: Marlou Porch new Subjective:  Still somewhat short of breath. Still feeling fluid overloaded, edema in his fingers for instance, legs. Remembers when he was discharged from [place], "fingers were skinny "  Objective:  Vital Signs in the last 24 hours: Temp:  [97.7 F (36.5 C)-98.7 F (37.1 C)] 98 F (36.7 C) (05/20 1210) Pulse Rate:  [75-92] 86 (05/20 1210) Resp:  [17-34] 23 (05/20 1210) BP: (84-138)/(50-69) 129/57 mmHg (05/20 1210) SpO2:  [91 %-97 %] 94 % (05/20 1210) Weight:  [330 lb 7.5 oz (149.9 kg)] 330 lb 7.5 oz (149.9 kg) (05/20 0344)  Intake/Output from previous day: 05/19 0701 - 05/20 0700 In: 1480.5 [I.V.:930.5; IV Piggyback:550] Out: [id number] [Urine:3450]   Physical Exam: General: Well developed, well nourished, in no acute distress. Head:  Normocephalic and atraumatic. Lungs: Clear to auscultation and percussion. Mild blunting at bases, especially right Heart: Normal S1 and S2.  No murmur, rubs or gallops.  Abdomen: soft, non-tender, positive bowel sounds. Obese Extremities: No clubbing or cyanosis. 2+ pitting bilateral edema. Edema of fingers as well Neurologic: Alert and oriented x 3.    Lab Results:  Recent Labs  04/01/16 0357 04/02/16 0220  WBC 18.7* 15.0*  HGB 10.0* 10.2*  PLT 566* 542*    Recent Labs  04/01/16 0357 04/02/16 0220  NA 137 138  K 3.9 3.8  CL 98* 97*  CO2 27 29  GLUCOSE 125* 134*  BUN 35* 36*  CREATININE 1.87* 2.11*   No results for input(s): TROPONINI in the last 72 hours.  Invalid input(s): CK, MB Hepatic Function Panel No results for input(s): PROT, ALBUMIN, AST, ALT, ALKPHOS, BILITOT, BILIDIR, IBILI in the last 72 hours. No results for input(s): CHOL in the last 72 hours. No results for input(s): PROTIME in the last 72 hours.  Imaging: Dg Chest Port 1 View  04/01/2016  CLINICAL DATA:  Respiratory failure. EXAM: PORTABLE CHEST 1 VIEW COMPARISON:  03/31/2016. FINDINGS: Stable cardiomegaly. Persistent right  lower lobe atelectasis and/or infiltrate with right pleural effusion. No interim change. No pneumothorax. IMPRESSION: 1. Persistent right lower lobe atelectasis and/or infiltrate with persistent right pleural effusion. 2.  Persistent unchanged cardiomegaly . Electronically Signed   By: Marcello Moores  Register   On: 04/01/2016 07:27   Personally viewed.   Telemetry: Sinus rhythm, no atrial fibrillation Personally viewed.   EKG:  Sinus rhythm Personally viewed.  Cardiac Studies:  TEE-no vegetations, trace to mild mitral regurgitation, EF ranging from 35-45%, pericardial fusion, fibrinous lateral  Meds: Scheduled Meds: . amiodarone  400 mg Oral BID  . antiseptic oral rinse  7 mL Mouth Rinse BID  . aspirin EC  81 mg Oral q morning - 10a  . atorvastatin  40 mg Oral QHS  . furosemide  40 mg Intravenous BID  . heparin  3,000 Units Intravenous Once  . metoprolol tartrate  75 mg Oral BID  . pantoprazole  40 mg Oral Q1200  . vancomycin  1,250 mg Intravenous Q12H   Continuous Infusions: . heparin 2,400 Units/hr (04/02/16 0937)   PRN Meds:.sodium chloride, alum & mag hydroxide-simeth, LORazepam, metoprolol, ondansetron (ZOFRAN) IV  Assessment/Plan:  Principal Problem:   HCAP (healthcare-associated pneumonia) Active Problems:   Atrial flutter with rapid ventricular response (HCC)   Long term (current) use of anticoagulants [Z79.01]   S/P MVR (mitral valve repair)   Hypotension (arterial)   Dyspnea   S/P Maze operation for atrial fibrillation   Gram-positive cocci bacteremia - MRSA   Acute respiratory failure ([place])  Acute on chronic combined systolic and diastolic heart failure (HCC)   Valvular cardiomyopathy ([place])  55 year old male status post mitral valve repair April 2017 in [place] admitted with pneumonia/acute systolic heart failure.  Acute systolic heart failure  - I still believe that he is fluid overloaded  - I will resume Lasix 40 mg IV twice a day and would like to continue  this for the time being.  - His creatinine increased, however he still remains fluid overloaded. We will monitor closely.  - Hopefully this will help ultimately with his shortness of breath as we can decongest him.  - Agree with PT.  - Continue with current beta blocker  - Unable to utilize ACE inhibitor at this time given his acute kidney injury.  - If for some reason dyspnea continues despite adequate diuresis, we will consider further evaluation by pulmonary team.  - Dr. Ellyn Hack previously has discussed with Dr. Roxy Manns of cardiothoracic surgery for his opinion.  Pericardial effusion  - No need for pericardiocentesis at this time. No hemodynamic compromise.  - Fibrinous-like appearance to fluid sometimes it is indicative of chronicity of effusion and not necessarily infection but I do agree with Dr. Graylon Good, infectious disease, that we should aggressively treat in case of possible infection.  Mitral valve repair  -Stable. Evaluated with TEE.  MRSA  - Vancomycin. ID. Appreciate their assistance.  Pneumonia  - Right pleural effusion, IV Lasix, antibiotics. I will order flutter valve. Continue incentive spirometry.  Paroxysmal atrial fibrillation  - Recent initiation of amiodarone 400 mg twice a day. 04/01/16  - We will decrease accordingly in the next few days.  - I think it would be reasonable to resume Coumadin per pharmacy. I do not envision any further invasive procedures.   Candee Furbish 04/02/2016, 12:13 PM

## 2016-04-02 NOTE — Progress Notes (Signed)
PT demonstrated verbal and hands on understanding of Flutter device. 

## 2016-04-02 NOTE — Progress Notes (Signed)
PROGRESS NOTE  Henry Dougherty [id number] DOB: Aug 19, 1961 DOA: 03/29/2016 PCP: Pcp Not In System   LOS: 4 days   Brief Narrative: 55 year old gentleman with history of severe MR due to a ruptured MV cord, just repaired on 03/17/16 in [place], PFO repair at the same time, also found to have A. fib with RVR status post maze procedure as well as clipping of the left atrial appendage, recently returned home to [place] on 5/13, was seen in Coumadin clinic on 5/16 and was found to be dyspneic, lethargic and was sent to the emergency room. He was admitted initially to ICU due to significant dyspnea and hypotension, initial chest x-ray showed large right effusion with potential consolidation, meaning SIRS criteria, had elevated lactic acid and was admitted with sepsis due to presumed HCAP. 2-D echo done on 5/17 showed an ejection fraction of 30-35% and also showed a large pericardial effusion without hemodynamic compromise. Cardiology was consulted. He was also started on diuresis. In addition to all this, his blood cultures returned positive for coag-negative staph 1 out of 2 bottles. He did well and improved on antibiotics, and was transferred to the hospitalist service on 5/19.  Assessment & Plan: Principal Problem:   HCAP (healthcare-associated pneumonia) Active Problems:   Atrial flutter with rapid ventricular response ([place])   Long term (current) use of anticoagulants [Z79.01]   S/P MVR (mitral valve repair)   Hypotension (arterial)   Dyspnea   S/P Maze operation for atrial fibrillation   Gram-positive cocci bacteremia - MRSA   Acute respiratory failure (HCC)   Acute on chronic combined systolic and diastolic heart failure (HCC)   Valvular cardiomyopathy (HCC)   Acute on chronic systolic CHF - Plan to obtain records from previous hospitalization, it is not clear if his depressed ejection fraction is new. May be related to recent surgery. Diuresis per cardiology.  - EF It appears to be  improving based on the TEE - hold Lasix this morning due to worsening in his Cr  PAF status post MAZE procedure and LAA closure - Currently maintaining sinus rhythm. Tele  - On amiodarone 400 twice a day  Severe MR s/p MV repair 5/14 - TEE as below, looks good  Coag negative staph bacteremia - 1 out of 2 bottles only, this may be a contaminant however given recent extensive intracardiac surgery consulted infectious disease. Appreciate input. - Repeat blood cultures obtained on 5/18 no growth to date - Patient underwent a TEE 5/19, no vegetations seen  Sepsis due to HCAP - On admission patient was hypothermic, tachycardic, hypotensive and with a chest x-ray with infiltrate and pleural effusion he was started on treatment for healthcare associated pneumonia given recent prolonged hospitalization with vancomycin and Zosyn - Consulted infectious disease today to evaluate for bacteremia, appreciate input - Patient denied to me fever or productive cough at home, he states that he has had night sweats since his procedure but no frank fevers. I wonder whether his presentation was related to a true infection or not. I - appreciate ID input.  - discontinue Zosyn since unlike HCAP  Acute hypoxic respiratory failure - Likely multifactorial in the setting of congestive heart failure, significant right-sided pleural effusion and potentially pericardial effusion - Treat underlying issues, wean off his oxygen as tolerated  Leukocytosis  Pericardial effusion - ? Postoperative   HTN - soft BP this morning, hold Amlodipine  AKI on probable CKD, unknown grade - unknown baseline renal function however patient tells me that he has had problems  with his creatinine during his hospitalization 2 weeks ago and he was monitored very closely - Would favor holding Lasix today   DVT prophylaxis: Coumadin, now on hold Code Status: Full Family Communication: Extensive d/w wife bedside Disposition Plan:  TBD  Consultants:   Cardiology   ID  Procedures:   2D echo 5/17 Study Conclusions - Left ventricle: The cavity size was normal. There was moderate concentric hypertrophy. Systolic function was moderately to severely reduced. The estimated ejection fraction was in the range of 30% to 35%. Diffuse hypokinesis. - Mitral valve: Prior procedures included surgical repair. An annular ring prosthesis was present and functioning normally. Valve area by continuity equation (using LVOT flow): 1.41 cm^2. - Left atrium: The atrium was moderately dilated. - Right atrium: The atrium was mildly dilated. - Pericardium, extracardiac: A large pericardial effusion was identified posterior to the heart 3.1cm and along the left ventricular free wall. The fluid exhibited a fibrinous appearance. There was no evidence of hemodynamic compromise (no RV or RA collpase) however IVC is dilated.   TEE 5/19 Study Conclusions - Left ventricle: Systolic function was mildly to moderately reduced. The estimated ejection fraction was in the range of 40% to 45%. Diffuse hypokinesis. - Aortic valve: No evidence of vegetation. There was mild regurgitation. - Mitral valve: Prior procedures included surgical repair. An annular ring prosthesis was present and functioning normally. No evidence of vegetation. There was trivial to mild regurgitation. - Right atrium: No evidence of thrombus in the atrial cavity or appendage. - Atrial septum: No defect or patent foramen ovale was identified (had prior repair). - Tricuspid valve: No evidence of vegetation. No evidence of vegetation. There was moderate regurgitation. - Pulmonic valve: No evidence of vegetation. - Pericardium, extracardiac: A moderate pericardial effusion was identified posterior to the heart and along the left ventricular free wall. The fluid exhibited a fibrinous appearance.There was no evidence of hemodynamic compromise.  Antimicrobials:  Vancomycin 5/16  >>  Zosyn 5/16 >>   Subjective: - ongoing dyspnea, mild improvement  - No chest pain, no palpitations   Objective: Filed Vitals:   04/01/16 2313 04/02/16 0344 04/02/16 0632 04/02/16 0808  BP: 111/50 84/51 138/66 128/62  Pulse: 80 75 82 86  Temp: 98.7 F (37.1 C) 98.7 F (37.1 C)  97.9 F (36.6 C)  TempSrc: Oral Oral  Oral  Resp: 34 29 21 23   Height:      Weight:  149.9 kg (330 lb 7.5 oz)    SpO2: 95% 91% 95% 97%    Intake/Output Summary (Last 24 hours) at 04/02/16 1143 Last data filed at 04/02/16 1000  Gross per 24 hour  Intake 1807.24 ml  Output   3250 ml  Net -1442.76 ml   Filed Weights   03/31/16 0100 04/01/16 0349 04/02/16 0344  Weight: 150.2 kg (331 lb 2.1 oz) 151.1 kg (333 lb 1.8 oz) 149.9 kg (330 lb 7.5 oz)    Examination: Constitutional: NAD Filed Vitals:   04/01/16 2313 04/02/16 0344 04/02/16 0632 04/02/16 0808  BP: 111/50 84/51 138/66 128/62  Pulse: 80 75 82 86  Temp: 98.7 F (37.1 C) 98.7 F (37.1 C)  97.9 F (36.6 C)  TempSrc: Oral Oral  Oral  Resp: 34 29 21 23   Height:      Weight:  149.9 kg (330 lb 7.5 oz)    SpO2: 95% 91% 95% 97%   Eyes: PERRL, lids and conjunctivae normal ENMT: Mucous membranes are moist. No oropharyngeal exudates Respiratory: clear to auscultation bilaterally,  no wheezing, decreased breath sounds on right. Normal respiratory effort. No accessory muscle use.  Cardiovascular: Regular rate and rhythm, no MRG. 1+ LE edema. 2+ pedal pulses.   Abdomen: no tenderness. Bowel sounds positive.  Musculoskeletal: no clubbing / cyanosis. No joint deformity upper and lower extremities. No contractures. Normal muscle tone.  Skin: no rashes, lesions, ulcers. No induration. Surgical site right chest healing well Neurologic: CN 2-12 grossly intact. Strength 5/5 in all 4.  Psychiatric: Normal judgment and insight. Alert and oriented x 3. Normal mood.    Data Reviewed: I have personally reviewed following labs and imaging  studies  CBC:  Recent Labs Lab 03/29/16 1250 03/30/16 0207 03/31/16 0405 04/01/16 0357 04/02/16 0220  WBC 22.0* 18.5* 22.1* 18.7* 15.0*  HGB 12.5* 10.7* 10.9* 10.0* 10.2*  HCT 38.9* 32.5* 32.8* 30.7* 31.0*  MCV 93.7 92.6 94.8 91.6 91.4  PLT 644* 525* 702* 566* [id number]*   Basic Metabolic Panel:  Recent Labs Lab 03/29/16 1250 03/30/16 0207 03/31/16 0405 04/01/16 0357 04/02/16 0220  NA 135 136 136 137 138  K 4.3 4.1 4.8 3.9 3.8  CL 99* 101 98* 98* 97*  CO2 23 22 26 27 29   GLUCOSE 115* 165* 159* 125* 134*  BUN 40* 34* 34* 35* 36*  CREATININE 2.05* 1.52* 1.79* 1.87* 2.11*  CALCIUM 9.1 8.5* 8.5* 8.5* 8.3*  MG  --   --   --  2.2  --   PHOS  --   --   --  4.5  --    GFR: Estimated Creatinine Clearance: 57.4 mL/min (by C-G formula based on Cr of 2.11). Liver Function Tests:  Recent Labs Lab 03/29/16 1250  AST 51*  ALT 112*  ALKPHOS 107  BILITOT 0.7  PROT 7.2  ALBUMIN 3.0*   No results for input(s): LIPASE, AMYLASE in the last 168 hours. No results for input(s): AMMONIA in the last 168 hours. Coagulation Profile:  Recent Labs Lab 03/29/16 1250 03/30/16 2024 03/31/16 0405 04/01/16 0357 04/02/16 0220  INR 3.58* 3.12* 2.95* 2.07* 1.73*   Cardiac Enzymes:  Recent Labs Lab 03/29/16 1630 03/29/16 2315 03/30/16 0207  TROPONINI 0.12* 0.12* 0.14*   BNP (last 3 results) No results for input(s): PROBNP in the last 8760 hours. HbA1C: No results for input(s): HGBA1C in the last 72 hours. CBG:  Recent Labs Lab 03/29/16 2004  GLUCAP 151*   Lipid Profile: No results for input(s): CHOL, HDL, LDLCALC, TRIG, CHOLHDL, LDLDIRECT in the last 72 hours. Thyroid Function Tests:  Recent Labs  03/31/16 0405  TSH 2.459   Anemia Panel: No results for input(s): VITAMINB12, FOLATE, FERRITIN, TIBC, IRON, RETICCTPCT in the last 72 hours. Urine analysis:    Component Value Date/Time   COLORURINE YELLOW 03/29/2016 2041   APPEARANCEUR HAZY* 03/29/2016 2041   LABSPEC  1.023 03/29/2016 2041   PHURINE 5.5 03/29/2016 2041   GLUCOSEU NEGATIVE 03/29/2016 2041   HGBUR NEGATIVE 03/29/2016 2041   [place] NEGATIVE 03/29/2016 2041   [place] NEGATIVE 03/29/2016 2041   PROTEINUR NEGATIVE 03/29/2016 2041   NITRITE NEGATIVE 03/29/2016 2041   LEUKOCYTESUR NEGATIVE 03/29/2016 2041   Sepsis Labs: Invalid input(s): PROCALCITONIN, LACTICIDVEN  Recent Results (from the past 240 hour(s))  Blood Culture (routine x 2)     Status: None (Preliminary result)   Collection Time: 03/29/16  2:15 PM  Result Value Ref Range Status   Specimen Description BLOOD LEFT ANTECUBITAL  Final   Special Requests BOTTLES DRAWN AEROBIC AND ANAEROBIC 5CC  Final   Culture  NO GROWTH 4 DAYS  Final   Report Status PENDING  Incomplete  Blood Culture (routine x 2)     Status: Abnormal   Collection Time: 03/29/16  2:19 PM  Result Value Ref Range Status   Specimen Description BLOOD RIGHT ANTECUBITAL  Final   Special Requests BOTTLES DRAWN AEROBIC AND ANAEROBIC 5CC  Final   Culture  Setup Time   Final    GRAM POSITIVE COCCI IN CLUSTERS IN BOTH AEROBIC AND ANAEROBIC BOTTLES Organism ID to follow CRITICAL RESULT CALLED TO, READ BACK BY AND VERIFIED WITH: C. STEWART, PHARM D AT 1409 ON [id number] BY S. YARBROUGH    Culture (A)  Final    STAPHYLOCOCCUS SPECIES (COAGULASE NEGATIVE) THE SIGNIFICANCE OF ISOLATING THIS ORGANISM FROM A SINGLE SET OF BLOOD CULTURES WHEN MULTIPLE SETS ARE DRAWN IS UNCERTAIN. PLEASE NOTIFY THE MICROBIOLOGY DEPARTMENT WITHIN ONE WEEK IF SPECIATION AND SENSITIVITIES ARE REQUIRED.    Report Status 04/01/2016 FINAL  Final  Blood Culture ID Panel (Reflexed)     Status: Abnormal   Collection Time: 03/29/16  2:19 PM  Result Value Ref Range Status   Enterococcus species NOT DETECTED NOT DETECTED Final   Vancomycin resistance NOT DETECTED NOT DETECTED Final   Listeria monocytogenes NOT DETECTED NOT DETECTED Final   Staphylococcus species DETECTED (A) NOT DETECTED Final     Comment: CRITICAL RESULT CALLED TO, READ BACK BY AND VERIFIED WITH: C. STEWART,PHARM D AT 1409 ON [id number] BY S. YARBROUGH    Staphylococcus aureus NOT DETECTED NOT DETECTED Final   Methicillin resistance DETECTED (A) NOT DETECTED Final    Comment: CRITICAL RESULT CALLED TO, READ BACK BY AND VERIFIED WITH: C STEWART, PHARM D AT 1409 ON [id number] BY S. YARBROUGH    Streptococcus species NOT DETECTED NOT DETECTED Final   Streptococcus agalactiae NOT DETECTED NOT DETECTED Final   Streptococcus pneumoniae NOT DETECTED NOT DETECTED Final   Streptococcus pyogenes NOT DETECTED NOT DETECTED Final   Acinetobacter baumannii NOT DETECTED NOT DETECTED Final   Enterobacteriaceae species NOT DETECTED NOT DETECTED Final   Enterobacter cloacae complex NOT DETECTED NOT DETECTED Final   Escherichia coli NOT DETECTED NOT DETECTED Final   Klebsiella oxytoca NOT DETECTED NOT DETECTED Final   Klebsiella pneumoniae NOT DETECTED NOT DETECTED Final   Proteus species NOT DETECTED NOT DETECTED Final   Serratia marcescens NOT DETECTED NOT DETECTED Final   Carbapenem resistance NOT DETECTED NOT DETECTED Final   Haemophilus influenzae NOT DETECTED NOT DETECTED Final   Neisseria meningitidis NOT DETECTED NOT DETECTED Final   Pseudomonas aeruginosa NOT DETECTED NOT DETECTED Final   Candida albicans NOT DETECTED NOT DETECTED Final   Candida glabrata NOT DETECTED NOT DETECTED Final   Candida krusei NOT DETECTED NOT DETECTED Final   Candida parapsilosis NOT DETECTED NOT DETECTED Final   Candida tropicalis NOT DETECTED NOT DETECTED Final  MRSA PCR Screening     Status: None   Collection Time: 03/29/16  5:54 PM  Result Value Ref Range Status   MRSA by PCR NEGATIVE NEGATIVE Final    Comment:        The GeneXpert MRSA Assay (FDA approved for NASAL specimens only), is one component of a comprehensive MRSA colonization surveillance program. It is not intended to diagnose MRSA infection nor to guide or monitor treatment  for MRSA infections.   Urine culture     Status: None   Collection Time: 03/29/16  8:41 PM  Result Value Ref Range Status   Specimen Description URINE, CLEAN CATCH  Final   Special Requests NONE  Final   Culture NO GROWTH  Final   Report Status 03/31/2016 FINAL  Final  Culture, blood (Routine X 2) w Reflex to ID Panel     Status: None (Preliminary result)   Collection Time: 03/31/16 12:21 PM  Result Value Ref Range Status   Specimen Description BLOOD LEFT HAND  Final   Special Requests IN PEDIATRIC BOTTLE 3CC  Final   Culture NO GROWTH 2 DAYS  Final   Report Status PENDING  Incomplete  Culture, blood (Routine X 2) w Reflex to ID Panel     Status: None (Preliminary result)   Collection Time: 03/31/16 12:29 PM  Result Value Ref Range Status   Specimen Description BLOOD RIGHT HAND  Final   Special Requests IN PEDIATRIC BOTTLE 0.5CC  Final   Culture NO GROWTH 2 DAYS  Final   Report Status PENDING  Incomplete   Radiology Studies: Dg Chest Port 1 View  04/01/2016  CLINICAL DATA:  Respiratory failure. EXAM: PORTABLE CHEST 1 VIEW COMPARISON:  03/31/2016. FINDINGS: Stable cardiomegaly. Persistent right lower lobe atelectasis and/or infiltrate with right pleural effusion. No interim change. No pneumothorax. IMPRESSION: 1. Persistent right lower lobe atelectasis and/or infiltrate with persistent right pleural effusion. 2.  Persistent unchanged cardiomegaly . Electronically Signed   By: Marcello Moores  Register   On: 04/01/2016 07:27   Scheduled Meds: . amiodarone  400 mg Oral BID  . antiseptic oral rinse  7 mL Mouth Rinse BID  . aspirin EC  81 mg Oral q morning - 10a  . atorvastatin  40 mg Oral QHS  . heparin  3,000 Units Intravenous Once  . metoprolol tartrate  75 mg Oral BID  . pantoprazole  40 mg Oral Q1200  . vancomycin  1,250 mg Intravenous Q12H   Continuous Infusions: . heparin 2,400 Units/hr (04/02/16 0937)   Time spent: 35 minutes, more than 50% bedside in counseling and discussion and  answering all family questions  Marzetta Board, MD, PhD [facility] Pager [phone]  If 7PM-7AM, please contact night-coverage [email or web address] Password [facility] 04/02/2016, 11:43 AM

## 2016-04-02 NOTE — Evaluation (Signed)
Physical Therapy Evaluation Patient Details Name: Henry Dougherty MRN: [id number] DOB: June 09, 1961 Today's Date: 04/02/2016   History of Present Illness  Patient is a 55 yo male admitted 03/29/16 with dyspnea, hypotension.  Patient with HCAP and CHF.   PMH:  severe mitral regurg s/p repair, Afib with RVR, CHF  Clinical Impression  Patient functioning at independent to supervision level with all mobility and gait.  Good balance with gait.  Encouraged ambulation with nursing.  No acute PT needs identified - PT will sign off.    Follow Up Recommendations No PT follow up;Supervision - Intermittent    Equipment Recommendations  None recommended by PT    Recommendations for Other Services       Precautions / Restrictions Precautions Precautions: None Restrictions Weight Bearing Restrictions: No      Mobility  Bed Mobility Overal bed mobility: Independent                Transfers Overall transfer level: Modified independent Equipment used: None             General transfer comment: Increased time  Ambulation/Gait Ambulation/Gait assistance: Supervision Ambulation Distance (Feet): 110 Feet Assistive device: None Gait Pattern/deviations: Step-through pattern;Decreased stride length   Gait velocity interpretation: at or above normal speed for age/gender General Gait Details: Patient with good gait pattern, balance, and speed.  O2 sats remained in 90's during gait.  Stairs            Wheelchair Mobility    Modified Rankin (Stroke Patients Only)       Balance Overall balance assessment: No apparent balance deficits (not formally assessed)                           High level balance activites: Direction changes;Turns;Sudden stops;Head turns High Level Balance Comments: No loss of balance during high level balance activities             Pertinent Vitals/Pain Pain Assessment: 0-10 Pain Score: 3  Pain Location: incision sites Pain  Descriptors / Indicators: Sore Pain Intervention(s): Monitored during session;Repositioned    Home Living Family/patient expects to be discharged to:: Private residence Living Arrangements: Spouse/significant other Available Help at Discharge: Family;Available 24 hours/day Type of Home: House Home Access: Level entry     Home Layout: One level Home Equipment: None      Prior Function Level of Independence: Independent               Hand Dominance        Extremity/Trunk Assessment   Upper Extremity Assessment: Overall WFL for tasks assessed           Lower Extremity Assessment: Overall WFL for tasks assessed         Communication   Communication: No difficulties  Cognition Arousal/Alertness: Awake/alert Behavior During Therapy: WFL for tasks assessed/performed Overall Cognitive Status: Within Functional Limits for tasks assessed                      General Comments      Exercises        Assessment/Plan    PT Assessment Patent does not need any further PT services  PT Diagnosis Generalized weakness   PT Problem List    PT Treatment Interventions     PT Goals (Current goals can be found in the Care Plan section) Acute Rehab PT Goals PT Goal Formulation: All assessment and education complete, DC therapy  Frequency     Barriers to discharge        Co-evaluation               End of Session   Activity Tolerance: Patient tolerated treatment well Patient left: with call bell/phone within reach (In bathroom - to use call bell for RN) Nurse Communication: Mobility status (In bathroom)         Time: [id number] PT Time Calculation (min) (ACUTE ONLY): 10 min   Charges:   PT Evaluation $PT Eval Moderate Complexity: 1 Procedure     PT G CodesDespina Pole 27-Apr-2016, 7:55 PM Carita Pian. Sanjuana Kava, [place] Pager [phone]

## 2016-04-02 NOTE — Progress Notes (Addendum)
ANTICOAGULATION CONSULT NOTE - Follow Up Consult  Pharmacy Consult for heparin/warfarin Indication: atrial fibrillation  Allergies  Allergen Reactions  . Amiodarone     Blisters and whelps to IV FORM.    Patient Measurements: Height: 5\' 10"  (177.8 cm) Weight: (!) 330 lb 7.5 oz (149.9 kg) IBW/kg (Calculated) : 73 Heparin dosing weight: 109 kg  Vital Signs: Temp: 97.9 F (36.6 C) (05/20 0808) Temp Source: Oral (05/20 0808) BP: 128/62 mmHg (05/20 0808) Pulse Rate: 86 (05/20 0808)  Labs:  Recent Labs  03/31/16 0405 04/01/16 0357 04/01/16 1549 04/02/16 0220 04/02/16 0828  HGB 10.9* 10.0*  --  10.2*  --   HCT 32.8* 30.7*  --  31.0*  --   PLT 702* 566*  --  542*  --   LABPROT 30.2* 23.2*  --  20.3*  --   INR 2.95* 2.07*  --  1.73*  --   HEPARINUNFRC  --   --  <0.10* 0.20* <0.10*  CREATININE 1.79* 1.87*  --  2.11*  --     Estimated Creatinine Clearance: 57.4 mL/min (by C-G formula based on Cr of 2.11).  Assessment: 55 y/o admitted with PNA. Pt on warfarin PTA for afib. Admit INR 3.58 and now down to 1.73 and on heparin bridge back to warfarin. Heparin level remains subtherapeutic on 2200 units/hr. CBC stable. No issues with line or bleeding reported per RN.  PTA coumadin dose: 5 mg T/T/S/S and 10 mg M/W/F   Goal of Therapy:  Heparin level 0.3-0.7 units/ml Monitor platelets by anticoagulation protocol: Yes   Plan:  Heparin bolus of 3000 units *1 then increase heparin drip to 2400 units/hr Warfarin 10mg *1 tonight F/u 6 hr HL Daily HL/CBC/INR   Melburn Popper, PharmD Clinical Pharmacy Resident Pager: [phone] 04/02/2016 1:35 PM   Addendum: Heparin level is now therapeutic at 0.45. Continue heparin at 2400 units/hr and follow up AM labs.  Nena Jordan, PharmD, BCPS 04/02/2016, 5:09 PM

## 2016-04-03 ENCOUNTER — Inpatient Hospital Stay (HOSPITAL_COMMUNITY): Payer: BLUE CROSS/BLUE SHIELD

## 2016-04-03 LAB — BASIC METABOLIC PANEL
Anion gap: 12 (ref 5–15)
BUN: 27 mg/dL — ABNORMAL HIGH (ref 6–20)
CALCIUM: 8.5 mg/dL — AB (ref 8.9–10.3)
CO2: 27 mmol/L (ref 22–32)
CREATININE: 1.75 mg/dL — AB (ref 0.61–1.24)
Chloride: 98 mmol/L — ABNORMAL LOW (ref 101–111)
GFR calc Af Amer: 48 mL/min — ABNORMAL LOW (ref >60)
GFR calc non Af Amer: 42 mL/min — ABNORMAL LOW (ref >60)
GLUCOSE: 145 mg/dL — AB (ref 65–99)
Potassium: 3.4 mmol/L — ABNORMAL LOW (ref 3.5–5.1)
Sodium: 137 mmol/L (ref 135–145)

## 2016-04-03 LAB — CULTURE, BLOOD (ROUTINE X 2): CULTURE: NO GROWTH

## 2016-04-03 LAB — PROTIME-INR
INR: 1.49 (ref 0.00–1.49)
Prothrombin Time: 18 seconds — ABNORMAL HIGH (ref 11.6–15.2)

## 2016-04-03 LAB — CBC
HCT: 32 % — ABNORMAL LOW (ref 39.0–52.0)
Hemoglobin: 10.5 g/dL — ABNORMAL LOW (ref 13.0–17.0)
MCH: 29.8 pg (ref 26.0–34.0)
MCHC: 32.8 g/dL (ref 30.0–36.0)
MCV: 90.9 fL (ref 78.0–100.0)
PLATELETS: 542 10*3/uL — AB (ref 150–400)
RBC: 3.52 MIL/uL — ABNORMAL LOW (ref 4.22–5.81)
RDW: 13 % (ref 11.5–15.5)
WBC: 14 10*3/uL — AB (ref 4.0–10.5)

## 2016-04-03 LAB — VANCOMYCIN, TROUGH: Vancomycin Tr: 51 ug/mL (ref 10.0–20.0)

## 2016-04-03 LAB — HEPARIN LEVEL (UNFRACTIONATED): Heparin Unfractionated: 0.34 IU/mL (ref 0.30–0.70)

## 2016-04-03 MED ORDER — FUROSEMIDE 10 MG/ML IJ SOLN
80.0000 mg | Freq: Three times a day (TID) | INTRAMUSCULAR | Status: DC
  Administered 2016-04-03 – 2016-04-04 (×5): 80 mg via INTRAVENOUS
  Filled 2016-04-03 (×6): qty 8

## 2016-04-03 MED ORDER — POTASSIUM CHLORIDE CRYS ER 20 MEQ PO TBCR
40.0000 meq | EXTENDED_RELEASE_TABLET | Freq: Two times a day (BID) | ORAL | Status: AC
  Administered 2016-04-03 (×2): 40 meq via ORAL
  Filled 2016-04-03 (×2): qty 2

## 2016-04-03 MED ORDER — WARFARIN SODIUM 5 MG PO TABS
5.0000 mg | ORAL_TABLET | Freq: Once | ORAL | Status: AC
  Administered 2016-04-03: 5 mg via ORAL
  Filled 2016-04-03: qty 1

## 2016-04-03 MED ORDER — ZOLPIDEM TARTRATE 5 MG PO TABS
5.0000 mg | ORAL_TABLET | Freq: Every evening | ORAL | Status: DC | PRN
  Administered 2016-04-03 – 2016-04-04 (×2): 5 mg via ORAL
  Filled 2016-04-03 (×2): qty 1

## 2016-04-03 MED ORDER — POTASSIUM CHLORIDE CRYS ER 20 MEQ PO TBCR: 40.0000 meq | EXTENDED_RELEASE_TABLET | Freq: Two times a day (BID) | ORAL | Status: DC

## 2016-04-03 MED ORDER — FUROSEMIDE 10 MG/ML IJ SOLN
80.0000 mg | Freq: Two times a day (BID) | INTRAMUSCULAR | Status: DC
  Administered 2016-04-03: 80 mg via INTRAVENOUS

## 2016-04-03 NOTE — Progress Notes (Signed)
Cardiologist: Marlou Porch new Subjective:  A little better today. Still feeling fluid overloaded, edema in his fingers for instance, legs. Remembers when he was discharged from [place], "fingers were skinny "  Objective:  Vital Signs in the last 24 hours: Temp:  [97.8 F (36.6 C)-98.4 F (36.9 C)] 97.9 F (36.6 C) (05/21 0856) Pulse Rate:  [79-99] 85 (05/21 0856) Resp:  [16-31] 19 (05/21 0856) BP: (112-149)/(53-82) 124/53 mmHg (05/21 0856) SpO2:  [91 %-96 %] 94 % (05/21 0856) Weight:  [331 lb (150.141 kg)] 331 lb (150.141 kg) (05/21 0342)  Intake/Output from previous day: 05/20 0701 - 05/21 0700 In: 1558.8 [P.O.:480; I.V.:578.8; IV Piggyback:500] Out: 1300 [Urine:1300]   Physical Exam: General: Well developed, well nourished, in no acute distress. Head:  Normocephalic and atraumatic. Lungs: Clear to auscultation and percussion. Mild blunting at bases, especially right Heart: Normal S1 and S2.  No murmur, rubs or gallops.  Abdomen: soft, non-tender, positive bowel sounds. Obese Extremities: No clubbing or cyanosis. 2+ pitting bilateral edema. Edema of fingers as well Neurologic: Alert and oriented x 3.    Lab Results:  Recent Labs  04/02/16 0220 04/03/16 0336  WBC 15.0* 14.0*  HGB 10.2* 10.5*  PLT 542* 542*    Recent Labs  04/02/16 0220 04/03/16 0336  NA 138 137  K 3.8 3.4*  CL 97* 98*  CO2 29 27  GLUCOSE 134* 145*  BUN 36* 27*  CREATININE 2.11* 1.75*   No results for input(s): TROPONINI in the last 72 hours.  Invalid input(s): CK, MB Hepatic Function Panel No results for input(s): PROT, ALBUMIN, AST, ALT, ALKPHOS, BILITOT, BILIDIR, IBILI in the last 72 hours. No results for input(s): CHOL in the last 72 hours. No results for input(s): PROTIME in the last 72 hours.  Imaging: No results found. Chest x-ray from this morning appears improved, persistent right pleural effusion noted. Personally viewed.   Telemetry: Sinus rhythm, no atrial fibrillation  Personally viewed.   EKG:  Sinus rhythm Personally viewed.  Cardiac Studies:  TEE-no vegetations, trace to mild mitral regurgitation, EF ranging from 35-45%, pericardial fusion, fibrinous lateral  Meds: Scheduled Meds: . amiodarone  400 mg Oral BID  . aspirin EC  81 mg Oral q morning - 10a  . atorvastatin  40 mg Oral QHS  . furosemide  80 mg Intravenous TID  . metoprolol tartrate  75 mg Oral BID  . pantoprazole  40 mg Oral Q1200  . potassium chloride  40 mEq Oral BID  . vancomycin  1,250 mg Intravenous Q12H  . Warfarin - Pharmacist Dosing Inpatient   Does not apply q1800   Continuous Infusions: . heparin 2,400 Units/hr (04/03/16 0705)   PRN Meds:.sodium chloride, alum & mag hydroxide-simeth, LORazepam, metoprolol, ondansetron (ZOFRAN) IV  Assessment/Plan:  Principal Problem:   HCAP (healthcare-associated pneumonia) Active Problems:   Atrial flutter with rapid ventricular response (HCC)   Long term (current) use of anticoagulants [Z79.01]   S/P MVR (mitral valve repair)   Hypotension (arterial)   Dyspnea   S/P Maze operation for atrial fibrillation   Gram-positive cocci bacteremia - MRSA   Acute respiratory failure (HCC)   Acute on chronic combined systolic and diastolic heart failure (HCC)   Valvular cardiomyopathy (HCC)   SOB (shortness of breath)   Acute systolic heart failure ([place])  55 year old male status post mitral valve repair April 2017 in [place] admitted with pneumonia/acute systolic heart failure.  Acute systolic heart failure  - I still believe that he is fluid overloaded  -  Increase Lasix to 80 mg IV 3 times a day, only 1.6 L out yesterday.   - His creatinine mildly improved, he still remains fluid overloaded. We will monitor closely.  - Hopefully this will help ultimately with his shortness of breath as we can decongest him.  - Agree with PT.  - Continue with current beta blocker  - Unable to utilize ACE inhibitor at this time given his acute kidney  injury.  - If for some reason dyspnea continues despite adequate diuresis, we will consider further evaluation by pulmonary team.  - Dr. Ellyn Hack previously has discussed with Dr. Roxy Manns of cardiothoracic surgery for his opinion.  - Reviewed physical therapy note. No follow-up necessary.  Pericardial effusion  - No need for pericardiocentesis at this time. No hemodynamic compromise.  - Fibrinous-like appearance to fluid sometimes it is indicative of chronicity of effusion and not necessarily infection but I do agree with Dr. Graylon Good, infectious disease, that we should aggressively treat in case of possible infection. Records.  - On Vanc  Mitral valve repair  -Stable. Evaluated with TEE.  MRSA  - Vancomycin. ID. Appreciate their assistance.  Pneumonia  - Right pleural effusion, IV Lasix, antibiotics. I will order flutter valve. Continue incentive spirometry.  Paroxysmal atrial fibrillation  - Recent initiation of amiodarone 400 mg twice a day. 04/01/16  - We will decrease accordingly in the next few days.  - I think it would be reasonable to resume Coumadin per pharmacy. I do not envision any further invasive procedures.  Morbid obesity  - Continue to strongly encourage weight loss.  Candee Furbish 04/03/2016, 10:47 AM

## 2016-04-03 NOTE — Progress Notes (Signed)
Patient noted to have Moister Associated Skin excoriation between folds of buttocks.  Area cleansed well and barrier cream applied.  Pink Allevyn foam removed as per patient's request. Order received for pressure relieving mattress per request of family and patient.

## 2016-04-03 NOTE — Progress Notes (Signed)
ANTICOAGULATION CONSULT NOTE - Follow Up Consult  Pharmacy Consult for heparin/warfarin Indication: atrial fibrillation  Allergies  Allergen Reactions  . Amiodarone     Blisters and whelps to IV FORM.    Patient Measurements: Height: 5\' 10"  (177.8 cm) Weight: (!) 331 lb (150.141 kg) IBW/kg (Calculated) : 73 Heparin dosing weight: 109 kg  Vital Signs: Temp: 97.9 F (36.6 C) (05/21 1224) Temp Source: Oral (05/21 1224) BP: 112/79 mmHg (05/21 1224) Pulse Rate: 77 (05/21 1224)  Labs:  Recent Labs  04/01/16 0357  04/02/16 0220 04/02/16 0828 04/02/16 1558 04/03/16 0336  HGB 10.0*  --  10.2*  --   --  10.5*  HCT 30.7*  --  31.0*  --   --  32.0*  PLT 566*  --  542*  --   --  542*  LABPROT 23.2*  --  20.3*  --   --  18.0*  INR 2.07*  --  1.73*  --   --  1.49  HEPARINUNFRC  --   < > 0.20* <0.10* 0.45 0.34  CREATININE 1.87*  --  2.11*  --   --  1.75*  < > = values in this interval not displayed.  Estimated Creatinine Clearance: 69.2 mL/min (by C-G formula based on Cr of 1.75).  Assessment: 55 y/o admitted with PNA. Pt on warfarin PTA for afib. Admit INR 3.58 and now down to 1.49 and on heparin bridge back to warfarin. Heparin level remains therapeutic on 2400 units/hr. Will resume patients home does of coumadin at this time and trend INR. CBC stable.   RN called main pharmacy to report that patient was down to one line: heparin and vanc are not compatible. IVT couldn't get another line this am. Spoke to Dr. Cruzita Lederer who said that he would like to try to keep both heparin and vancomycin at this time. RN will have to pause heparin for doses of vanc until patient obtains another line.   PTA coumadin dose: 5 mg T/T/S/S and 10 mg M/W/F   Goal of Therapy:  Heparin level 0.3-0.7 units/ml Monitor platelets by anticoagulation protocol: Yes   Plan:  Continue heparin drip at 2400 units/hr Warfarin 5mg *1 tonight Daily HL/CBC/INR   Melburn Popper, PharmD Clinical Pharmacy  Resident Pager: [phone] 04/03/2016 12:44 PM

## 2016-04-03 NOTE — Progress Notes (Addendum)
PROGRESS NOTE  Henry Dougherty [id number] DOB: 1961-01-24 DOA: 03/29/2016 PCP: Pcp Not In System   LOS: 5 days   Brief Narrative: 55 year old gentleman with history of severe MR due to a ruptured MV cord, just repaired on 03/17/16 in [place], PFO repair at the same time, also found to have A. fib with RVR status post maze procedure as well as clipping of the left atrial appendage, recently returned home to [place] on 5/13, was seen in Coumadin clinic on 5/16 and was found to be dyspneic, lethargic and was sent to the emergency room. He was admitted initially to ICU due to significant dyspnea and hypotension, initial chest x-ray showed large right effusion with potential consolidation, meaning SIRS criteria, had elevated lactic acid and was admitted with sepsis due to presumed HCAP. 2-D echo done on 5/17 showed an ejection fraction of 30-35% and also showed a large pericardial effusion without hemodynamic compromise. Cardiology was consulted. He was also started on diuresis. In addition to all this, his blood cultures returned positive for coag-negative staph 1 out of 2 bottles. He did well and improved on antibiotics, and was transferred to the hospitalist service on 5/19.  Assessment & Plan: Principal Problem:   HCAP (healthcare-associated pneumonia) Active Problems:   Atrial flutter with rapid ventricular response ([place])   Long term (current) use of anticoagulants [Z79.01]   S/P MVR (mitral valve repair)   Hypotension (arterial)   Dyspnea   S/P Maze operation for atrial fibrillation   Gram-positive cocci bacteremia - MRSA   Acute respiratory failure (HCC)   Acute on chronic combined systolic and diastolic heart failure (HCC)   Valvular cardiomyopathy (HCC)   SOB (shortness of breath)   Acute systolic heart failure (HCC)   Acute on chronic systolic CHF - Plan to obtain records from previous hospitalization, it is not clear if his depressed ejection fraction is new. May be related  to recent surgery. Diuresis per cardiology.  - EF It appears to be improving based on the TEE - hold Lasix this morning due to worsening in his Cr - Apparently records have arrived to the cardiology clinic and are awaiting to be scanned  PAF status post MAZE procedure and LAA closure - Currently maintaining sinus rhythm. Tele  - On amiodarone 400 twice a day  - Continue Coumadin  Severe MR s/p MV repair 5/14 - TEE as below, looks good  Coag negative staph bacteremia - 1 out of 2 bottles only, this may be a contaminant however given recent extensive intracardiac surgery consulted infectious disease. Appreciate input. - Repeat blood cultures obtained on 5/18 no growth to date - Patient underwent a TEE 5/19, no vegetations seen - continue vancomycin for now, ID to see again tomorrow, surveillance cultures have remained negative   Sepsis due to HCAP - On admission patient was hypothermic, tachycardic, hypotensive and with a chest x-ray with infiltrate and pleural effusion he was started on treatment for healthcare associated pneumonia given recent prolonged hospitalization with vancomycin and Zosyn - Consulted infectious disease today to evaluate for bacteremia, appreciate input - Patient denied to me fever or productive cough at home, he states that he has had night sweats since his procedure but no frank fevers. I wonder whether his presentation was related to a true infection or not. I - appreciate ID input.  - discontinued Zosyn since unlike HCAP  Acute hypoxic respiratory failure - Likely multifactorial in the setting of congestive heart failure, significant right-sided pleural effusion and potentially pericardial effusion -  Treat underlying issues, wean off his oxygen as tolerated - On room air this morning - Repeated chest x-ray to reevaluate the right-sided pleural effusion  Leukocytosis  Pericardial effusion - ? Postoperative   HTN - soft BP this morning, hold  Amlodipine  AKI on probable CKD, unknown grade - unknown baseline renal function however patient tells me that he has had problems with his creatinine during his hospitalization 2 weeks ago and he was monitored very closely - Continue Lasix, renal function has remained stable and creatinine actually looks a little bit better today    DVT prophylaxis: Coumadin Code Status: Full Family Communication: Extensive d/w wife bedside Disposition Plan: TBD  Consultants:   Cardiology   ID  Procedures:   2D echo 5/17 Study Conclusions - Left ventricle: The cavity size was normal. There was moderate concentric hypertrophy. Systolic function was moderately to severely reduced. The estimated ejection fraction was in the range of 30% to 35%. Diffuse hypokinesis. - Mitral valve: Prior procedures included surgical repair. An annular ring prosthesis was present and functioning normally. Valve area by continuity equation (using LVOT flow): 1.41 cm^2. - Left atrium: The atrium was moderately dilated. - Right atrium: The atrium was mildly dilated. - Pericardium, extracardiac: A large pericardial effusion was identified posterior to the heart 3.1cm and along the left ventricular free wall. The fluid exhibited a fibrinous appearance. There was no evidence of hemodynamic compromise (no RV or RA collpase) however IVC is dilated.   TEE 5/19 Study Conclusions - Left ventricle: Systolic function was mildly to moderately reduced. The estimated ejection fraction was in the range of 40% to 45%. Diffuse hypokinesis. - Aortic valve: No evidence of vegetation. There was mild regurgitation. - Mitral valve: Prior procedures included surgical repair. An annular ring prosthesis was present and functioning normally. No evidence of vegetation. There was trivial to mild regurgitation. - Right atrium: No evidence of thrombus in the atrial cavity or appendage. - Atrial septum: No defect or patent foramen ovale was identified  (had prior repair). - Tricuspid valve: No evidence of vegetation. No evidence of vegetation. There was moderate regurgitation. - Pulmonic valve: No evidence of vegetation. - Pericardium, extracardiac: A moderate pericardial effusion was identified posterior to the heart and along the left ventricular free wall. The fluid exhibited a fibrinous appearance.There was no evidence of hemodynamic compromise.  Antimicrobials:  Vancomycin 5/16 >>  Zosyn 5/16 >>   Subjective: - Able to ambulate last night include a bit more, felt better and not short of breath as before - Denies any chest pain or palpitations - Her an episode of diarrhea last night for which C. difficile was ordered however his stools this morning are formed  Objective: Filed Vitals:   04/03/16 0000 04/03/16 0022 04/03/16 0342 04/03/16 0856  BP:  126/72 124/67 124/53  Pulse: 83 79 79 85  Temp:  97.8 F (36.6 C) 98.1 F (36.7 C) 97.9 F (36.6 C)  TempSrc:  Oral Oral Oral  Resp: 16 31 23 19   Height:      Weight:   150.141 kg (331 lb)   SpO2: 91% 92% 95% 94%    Intake/Output Summary (Last 24 hours) at 04/03/16 1103 Last data filed at 04/03/16 0900  Gross per 24 hour  Intake   1280 ml  Output   1500 ml  Net   -220 ml   Filed Weights   04/01/16 0349 04/02/16 0344 04/03/16 0342  Weight: 151.1 kg (333 lb 1.8 oz) 149.9 kg (330 lb  7.5 oz) 150.141 kg (331 lb)    Examination: Constitutional: NAD Filed Vitals:   04/03/16 0000 04/03/16 0022 04/03/16 0342 04/03/16 0856  BP:  126/72 124/67 124/53  Pulse: 83 79 79 85  Temp:  97.8 F (36.6 C) 98.1 F (36.7 C) 97.9 F (36.6 C)  TempSrc:  Oral Oral Oral  Resp: 16 31 23 19   Height:      Weight:   150.141 kg (331 lb)   SpO2: 91% 92% 95% 94%   Eyes: PERRL, lids and conjunctivae normal ENMT: Mucous membranes are moist. No oropharyngeal exudates Respiratory: clear to auscultation bilaterally, no wheezing, decreased breath sounds on right. Normal respiratory effort. No  accessory muscle use.  Cardiovascular: Regular rate and rhythm, no MRG. 1+ LE edema. 2+ pedal pulses.   Abdomen: no tenderness. Bowel sounds positive.  Musculoskeletal: no clubbing / cyanosis. No joint deformity upper and lower extremities. No contractures. Normal muscle tone.  Skin: no rashes, lesions, ulcers. No induration. Surgical site right chest healing well Neurologic: CN 2-12 grossly intact. Strength 5/5 in all 4.  Psychiatric: Normal judgment and insight. Alert and oriented x 3. Normal mood.    Data Reviewed: I have personally reviewed following labs and imaging studies  CBC:  Recent Labs Lab 03/30/16 0207 03/31/16 0405 04/01/16 0357 04/02/16 0220 04/03/16 0336  WBC 18.5* 22.1* 18.7* 15.0* 14.0*  HGB 10.7* 10.9* 10.0* 10.2* 10.5*  HCT 32.5* 32.8* 30.7* 31.0* 32.0*  MCV 92.6 94.8 91.6 91.4 90.9  PLT 525* 702* 566* 542* [id number]*   Basic Metabolic Panel:  Recent Labs Lab 03/30/16 0207 03/31/16 0405 04/01/16 0357 04/02/16 0220 04/03/16 0336  NA 136 136 137 138 137  K 4.1 4.8 3.9 3.8 3.4*  CL 101 98* 98* 97* 98*  CO2 22 26 27 29 27   GLUCOSE 165* 159* 125* 134* 145*  BUN 34* 34* 35* 36* 27*  CREATININE 1.52* 1.79* 1.87* 2.11* 1.75*  CALCIUM 8.5* 8.5* 8.5* 8.3* 8.5*  MG  --   --  2.2  --   --   PHOS  --   --  4.5  --   --    GFR: Estimated Creatinine Clearance: 69.2 mL/min (by C-G formula based on Cr of 1.75). Liver Function Tests:  Recent Labs Lab 03/29/16 1250  AST 51*  ALT 112*  ALKPHOS 107  BILITOT 0.7  PROT 7.2  ALBUMIN 3.0*   Coagulation Profile:  Recent Labs Lab 03/30/16 2024 03/31/16 0405 04/01/16 0357 04/02/16 0220 04/03/16 0336  INR 3.12* 2.95* 2.07* 1.73* 1.49   Cardiac Enzymes:  Recent Labs Lab 03/29/16 1630 03/29/16 2315 03/30/16 0207  TROPONINI 0.12* 0.12* 0.14*   CBG:  Recent Labs Lab 03/29/16 2004  GLUCAP 151*   Urine analysis:    Component Value Date/Time   COLORURINE YELLOW 03/29/2016 2041   APPEARANCEUR HAZY*  03/29/2016 2041   LABSPEC 1.023 03/29/2016 2041   PHURINE 5.5 03/29/2016 2041   GLUCOSEU NEGATIVE 03/29/2016 2041   HGBUR NEGATIVE 03/29/2016 2041   [place] NEGATIVE 03/29/2016 2041   [place] NEGATIVE 03/29/2016 2041   PROTEINUR NEGATIVE 03/29/2016 2041   NITRITE NEGATIVE 03/29/2016 2041   LEUKOCYTESUR NEGATIVE 03/29/2016 2041   Sepsis Labs: Invalid input(s): PROCALCITONIN, LACTICIDVEN  Recent Results (from the past 240 hour(s))  Blood Culture (routine x 2)     Status: None (Preliminary result)   Collection Time: 03/29/16  2:15 PM  Result Value Ref Range Status   Specimen Description BLOOD LEFT ANTECUBITAL  Final   Special Requests  BOTTLES DRAWN AEROBIC AND ANAEROBIC 5CC  Final   Culture NO GROWTH 4 DAYS  Final   Report Status PENDING  Incomplete  Blood Culture (routine x 2)     Status: Abnormal   Collection Time: 03/29/16  2:19 PM  Result Value Ref Range Status   Specimen Description BLOOD RIGHT ANTECUBITAL  Final   Special Requests BOTTLES DRAWN AEROBIC AND ANAEROBIC 5CC  Final   Culture  Setup Time   Final    GRAM POSITIVE COCCI IN CLUSTERS IN BOTH AEROBIC AND ANAEROBIC BOTTLES Organism ID to follow CRITICAL RESULT CALLED TO, READ BACK BY AND VERIFIED WITH: C. STEWART, PHARM D AT 1409 ON [id number] BY S. YARBROUGH    Culture (A)  Final    STAPHYLOCOCCUS SPECIES (COAGULASE NEGATIVE) THE SIGNIFICANCE OF ISOLATING THIS ORGANISM FROM A SINGLE SET OF BLOOD CULTURES WHEN MULTIPLE SETS ARE DRAWN IS UNCERTAIN. PLEASE NOTIFY THE MICROBIOLOGY DEPARTMENT WITHIN ONE WEEK IF SPECIATION AND SENSITIVITIES ARE REQUIRED.    Report Status 04/01/2016 FINAL  Final  Blood Culture ID Panel (Reflexed)     Status: Abnormal   Collection Time: 03/29/16  2:19 PM  Result Value Ref Range Status   Enterococcus species NOT DETECTED NOT DETECTED Final   Vancomycin resistance NOT DETECTED NOT DETECTED Final   Listeria monocytogenes NOT DETECTED NOT DETECTED Final   Staphylococcus species DETECTED (A)  NOT DETECTED Final    Comment: CRITICAL RESULT CALLED TO, READ BACK BY AND VERIFIED WITH: C. STEWART,PHARM D AT 1409 ON [id number] BY S. YARBROUGH    Staphylococcus aureus NOT DETECTED NOT DETECTED Final   Methicillin resistance DETECTED (A) NOT DETECTED Final    Comment: CRITICAL RESULT CALLED TO, READ BACK BY AND VERIFIED WITH: C STEWART, PHARM D AT 1409 ON [id number] BY S. YARBROUGH    Streptococcus species NOT DETECTED NOT DETECTED Final   Streptococcus agalactiae NOT DETECTED NOT DETECTED Final   Streptococcus pneumoniae NOT DETECTED NOT DETECTED Final   Streptococcus pyogenes NOT DETECTED NOT DETECTED Final   Acinetobacter baumannii NOT DETECTED NOT DETECTED Final   Enterobacteriaceae species NOT DETECTED NOT DETECTED Final   Enterobacter cloacae complex NOT DETECTED NOT DETECTED Final   Escherichia coli NOT DETECTED NOT DETECTED Final   Klebsiella oxytoca NOT DETECTED NOT DETECTED Final   Klebsiella pneumoniae NOT DETECTED NOT DETECTED Final   Proteus species NOT DETECTED NOT DETECTED Final   Serratia marcescens NOT DETECTED NOT DETECTED Final   Carbapenem resistance NOT DETECTED NOT DETECTED Final   Haemophilus influenzae NOT DETECTED NOT DETECTED Final   Neisseria meningitidis NOT DETECTED NOT DETECTED Final   Pseudomonas aeruginosa NOT DETECTED NOT DETECTED Final   Candida albicans NOT DETECTED NOT DETECTED Final   Candida glabrata NOT DETECTED NOT DETECTED Final   Candida krusei NOT DETECTED NOT DETECTED Final   Candida parapsilosis NOT DETECTED NOT DETECTED Final   Candida tropicalis NOT DETECTED NOT DETECTED Final  MRSA PCR Screening     Status: None   Collection Time: 03/29/16  5:54 PM  Result Value Ref Range Status   MRSA by PCR NEGATIVE NEGATIVE Final    Comment:        The GeneXpert MRSA Assay (FDA approved for NASAL specimens only), is one component of a comprehensive MRSA colonization surveillance program. It is not intended to diagnose MRSA infection nor to guide  or monitor treatment for MRSA infections.   Urine culture     Status: None   Collection Time: 03/29/16  8:41 PM  Result Value  Ref Range Status   Specimen Description URINE, CLEAN CATCH  Final   Special Requests NONE  Final   Culture NO GROWTH  Final   Report Status 03/31/2016 FINAL  Final  Culture, blood (Routine X 2) w Reflex to ID Panel     Status: None (Preliminary result)   Collection Time: 03/31/16 12:21 PM  Result Value Ref Range Status   Specimen Description BLOOD LEFT HAND  Final   Special Requests IN PEDIATRIC BOTTLE 3CC  Final   Culture NO GROWTH 2 DAYS  Final   Report Status PENDING  Incomplete  Culture, blood (Routine X 2) w Reflex to ID Panel     Status: None (Preliminary result)   Collection Time: 03/31/16 12:29 PM  Result Value Ref Range Status   Specimen Description BLOOD RIGHT HAND  Final   Special Requests IN PEDIATRIC BOTTLE 0.5CC  Final   Culture NO GROWTH 2 DAYS  Final   Report Status PENDING  Incomplete   Radiology Studies: No results found. Scheduled Meds: . amiodarone  400 mg Oral BID  . aspirin EC  81 mg Oral q morning - 10a  . atorvastatin  40 mg Oral QHS  . furosemide  80 mg Intravenous TID  . metoprolol tartrate  75 mg Oral BID  . pantoprazole  40 mg Oral Q1200  . potassium chloride  40 mEq Oral BID  . vancomycin  1,250 mg Intravenous Q12H  . Warfarin - Pharmacist Dosing Inpatient   Does not apply q1800   Continuous Infusions: . heparin 2,400 Units/hr (04/03/16 0705)   Time spent: 25 minutes, more than 50% bedside in counseling and discussion  Marzetta Board, MD, PhD [facility] Pager [phone]  If 7PM-7AM, please contact night-coverage [email or web address] Password [email or web address] 04/03/2016, 11:03 AM

## 2016-04-03 NOTE — Progress Notes (Signed)
Heparin and Vancomycin are incompatible via IV route. Pt has only one PIV that IV team placed. Pharmacy notified. Heparin infusion held during Vancomycin administration. Will restart Heparin when Vancomycin in completed. Will continue to monitor.

## 2016-04-04 ENCOUNTER — Encounter (HOSPITAL_COMMUNITY): Payer: Self-pay | Admitting: Cardiology

## 2016-04-04 DIAGNOSIS — J90 Pleural effusion, not elsewhere classified: Secondary | ICD-10-CM

## 2016-04-04 LAB — CBC
HCT: 34.2 % — ABNORMAL LOW (ref 39.0–52.0)
Hemoglobin: 11.1 g/dL — ABNORMAL LOW (ref 13.0–17.0)
MCH: 29.5 pg (ref 26.0–34.0)
MCHC: 32.5 g/dL (ref 30.0–36.0)
MCV: 91 fL (ref 78.0–100.0)
PLATELETS: 489 10*3/uL — AB (ref 150–400)
RBC: 3.76 MIL/uL — AB (ref 4.22–5.81)
RDW: 12.9 % (ref 11.5–15.5)
WBC: 13.2 10*3/uL — AB (ref 4.0–10.5)

## 2016-04-04 LAB — PROTIME-INR
INR: 1.66 — AB (ref 0.00–1.49)
Prothrombin Time: 19.6 seconds — ABNORMAL HIGH (ref 11.6–15.2)

## 2016-04-04 LAB — VANCOMYCIN, TROUGH: Vancomycin Tr: 21 ug/mL — ABNORMAL HIGH (ref 10.0–20.0)

## 2016-04-04 LAB — BASIC METABOLIC PANEL
ANION GAP: 13 (ref 5–15)
BUN: 22 mg/dL — AB (ref 6–20)
CALCIUM: 8.7 mg/dL — AB (ref 8.9–10.3)
CO2: 29 mmol/L (ref 22–32)
CREATININE: 1.79 mg/dL — AB (ref 0.61–1.24)
Chloride: 99 mmol/L — ABNORMAL LOW (ref 101–111)
GFR, EST AFRICAN AMERICAN: 47 mL/min — AB (ref >60)
GFR, EST NON AFRICAN AMERICAN: 41 mL/min — AB (ref >60)
GLUCOSE: 128 mg/dL — AB (ref 65–99)
Potassium: 3.6 mmol/L (ref 3.5–5.1)
SODIUM: 141 mmol/L (ref 135–145)

## 2016-04-04 LAB — HEPARIN LEVEL (UNFRACTIONATED): Heparin Unfractionated: 0.42 IU/mL (ref 0.30–0.70)

## 2016-04-04 MED ORDER — VANCOMYCIN HCL IN DEXTROSE 1-5 GM/200ML-% IV SOLN
1000.0000 mg | Freq: Two times a day (BID) | INTRAVENOUS | Status: DC
  Administered 2016-04-04 – 2016-04-06 (×5): 1000 mg via INTRAVENOUS
  Filled 2016-04-04 (×8): qty 200

## 2016-04-04 MED ORDER — WARFARIN SODIUM 5 MG PO TABS: 5.0000 mg | ORAL_TABLET | Freq: Once | ORAL | Status: DC

## 2016-04-04 MED ORDER — WARFARIN SODIUM 7.5 MG PO TABS
7.5000 mg | ORAL_TABLET | Freq: Once | ORAL | Status: AC
  Administered 2016-04-04: 7.5 mg via ORAL
  Filled 2016-04-04: qty 1

## 2016-04-04 NOTE — Progress Notes (Signed)
[facility] Infect[facility]    Date of Admission:  03/29/2016   Total days of antibiotics 7        Day 7 vancomycin           ID: Henry Dougherty is a 55 y.o. male with recent MVR presents with sob, hypotension, leukocytosis of 22K found to have large pleural effusion adn pericardial effusion. Infectious work up showed 2 of 4 bottles (1 set) positive of MRSE  Principal Problem:   HCAP (healthcare-associated pneumonia) Active Problems:   Atrial flutter with rapid ventricular response ([place])   Long term (current) use of anticoagulants [Z79.01]   S/P MVR (mitral valve repair)   Hypotension (arterial)   Dyspnea   S/P Maze operation for atrial fibrillation   Gram-positive cocci bacteremia - MRSA   Acute respiratory failure (HCC)   Acute on chronic combined systolic and diastolic heart failure (HCC)   Valvular cardiomyopathy (HCC)   SOB (shortness of breath)   Acute systolic heart failure (HCC)   Pleural effusion    Subjective: Afebrile, improved sob, loose stools from eating melons has now resolved  24 hr: continues to be in sinus rhythm, no longer supratherapeutic on vanco  Medications:  . amiodarone  400 mg Oral BID  . aspirin EC  81 mg Oral q morning - 10a  . atorvastatin  40 mg Oral QHS  . furosemide  80 mg Intravenous TID  . metoprolol tartrate  75 mg Oral BID  . pantoprazole  40 mg Oral Q1200  . vancomycin  1,000 mg Intravenous Q12H  . warfarin  7.5 mg Oral ONCE-1800  . Warfarin - Pharmacist Dosing Inpatient   Does not apply q1800    Objective: Vital signs in last 24 hours: Temp:  [97.7 F (36.5 C)-98.6 F (37 C)] 98.1 F (36.7 C) (05/22 1605) Pulse Rate:  [78-90] 84 (05/22 1605) Resp:  [17-31] 17 (05/22 1605) BP: (121-152)/(59-79) 152/79 mmHg (05/22 1605) SpO2:  [92 %-99 %] 95 % (05/22 1605) Weight:  [320 lb 3.2 oz (145.242 kg)] 320 lb 3.2 oz (145.242 kg) (05/22 0445)  Physical Exam  Constitutional: He is oriented to person, place, and time. He  appears well-developed and well-nourished. No distress.  HENT:  Mouth/Throat: Oropharynx is clear and moist. No oropharyngeal exudate.  Cardiovascular: distant heart sounds.Normal rate, regular rhythm and normal heart sounds. Exam reveals no gallop and no friction rub.  No murmur heard.  Pulmonary/Chest: Effort normal and breath sounds normal. No respiratory distress. He has no wheezes. Decrease BS on left Abdominal: Soft. Bowel sounds are normal. He exhibits no distension. There is no tenderness.  Ext: +1 pitting edema mid shin Skin: Skin is warm and dry. No rash noted. No erythema.  Psychiatric: He has a normal mood and affect. His behavior is normal.    Lab Results  Recent Labs  04/03/16 0336 04/04/16 0311  WBC 14.0* 13.2*  HGB 10.5* 11.1*  HCT 32.0* 34.2*  NA 137 141  K 3.4* 3.6  CL 98* 99*  CO2 27 29  BUN 27* 22*  CREATININE 1.75* 1.79*    Microbiology: 5/16 blood cx 1 of 2 sets + MRSE 5/18 blood cx NGTD Studies/Results: Dg Chest 2 View  04/03/2016  CLINICAL DATA:  Pleural effusion.  Cough. EXAM: CHEST  2 VIEW COMPARISON:  Apr 01, 2016 FINDINGS: There is a persistent right pleural effusion and underlying opacity, similar to slightly improved in the interval. IMPRESSION: Stable to slightly improved moderate size right pleural  effusion and underlying opacity. Electronically Signed   By: Dorise Bullion III M.D   On: 04/03/2016 11:14     Assessment/Plan: 55yo M with recent MVR admitted for shortness of breath, hypotension, found to have afib, hypotension leukocytosis, pleural effusion,+/- infiltrate on cxr, 1 of 2 sets blood cx with CoNS. TEE shows clean valve, though has pericardial effusion, SOB improved due to diuresis. Leukocytosis down to 13 though he has been on abtx for which we have been treating him for simple bacteremia. I am not convinced that despite only 1 set of culture is positive that this is contaminant. We don't have any other explanation for his marked  leukocytosis of 22K, thrombocytopenia +/- infiltrate. Recommend to be conservative and treat for bacteremia.  CoNS bacteremia = give an additional dose of vancomycin tomorrow, prior to discharge( day 8). Then only needs 6 days of linezolid 600mg  BID oral to start on 5/24 to finish course of treatment. Will need to find generic which is significantly cheaper than name brand.  supratherapeutic drug = vancomycin now back within range  DOE/SOB due to pleural effusion = improving with diuresis  Baxter Flattery [facility] for Infectious Diseases Cell: [phone] Pager: [phone]  04/04/2016, 4:55 PM

## 2016-04-04 NOTE — Progress Notes (Addendum)
HISTORY: Henry Dougherty is a 55 yo previously healthy male with PMHx of atrial fibrillation w RVR s/p MAZE procedure, PFO s/p closure, and Severe MV regurgitation from cord rupture s/p MV repair in April 2017 who was admitted on 5/17 with acute hypoxic respiratory failure 2/2 HCAP, R pleural effusion and atrial flutter with RVR. Patient is now in sinus rhythm. Blood cx were positive for coag negative staph in 1/2 bottles.  SUBJECTIVE:   Patient was seen and examined this morning. He states he is feeling much better, breathing is almost back to his baseline. He was able to ambulate yesterday without increased shortness of breath. He denies any chest pain, palpitations, nausea, or lightheadedness.   OBJECTIVE:   Vitals:   Filed Vitals:   04/03/16 2358 04/04/16 0445 04/04/16 0756 04/04/16 1120  BP: 128/78 135/73 137/63 121/64  Pulse: 79 84 81 78  Temp:  97.8 F (36.6 C) 97.7 F (36.5 C) 98 F (36.7 C)  TempSrc:  Oral Oral Oral  Resp: 23 18 18 19   Height:      Weight:  320 lb 3.2 oz (145.242 kg)    SpO2: 93% 93% 94% 97%   I&O's:    Intake/Output Summary (Last 24 hours) at 04/04/16 1122 Last data filed at 04/04/16 1120  Gross per 24 hour  Intake 1420.4 ml  Output   7275 ml  Net -5854.6 ml   TELEMETRY: Sinus rhythm, HR 80s, PVCs.  PHYSICAL EXAM General: Vital signs reviewed.  Patient is obese, in no acute distress. Cardiovascular: RRR, S1 normal, S2 normal, no murmurs, gallops, or rubs. +JVD Pulmonary/Chest: Decreased breath sounds in right lower lung field with inspiratory crackles, no wheezes, or rhonchi. Abdominal: Soft, non-tender, non-distended, BS +. Extremities: 1+ pitting edema to mid-shin bilaterally, pulses symmetric and intact bilaterally.  Skin: Warm, dry and intact.   LABS: Basic Metabolic Panel:  Recent Labs  04/03/16 0336 04/04/16 0311  NA 137 141  K 3.4* 3.6  CL 98* 99*  CO2 27 29  GLUCOSE 145* 128*  BUN 27* 22*  CREATININE 1.75* 1.79*  CALCIUM 8.5*  8.7*   CBC:  Recent Labs  04/03/16 0336 04/04/16 0311  WBC 14.0* 13.2*  HGB 10.5* 11.1*  HCT 32.0* 34.2*  MCV 90.9 91.0  PLT 542* 489*   Coag Panel:   Lab Results  Component Value Date   INR 1.66* 04/04/2016   INR 1.49 04/03/2016   INR 1.73* 04/02/2016    RADIOLOGY: Dg Chest 2 View  04/03/2016  CLINICAL DATA:  Pleural effusion.  Cough. EXAM: CHEST  2 VIEW COMPARISON:  Apr 01, 2016 FINDINGS: There is a persistent right pleural effusion and underlying opacity, similar to slightly improved in the interval. IMPRESSION: Stable to slightly improved moderate size right pleural effusion and underlying opacity. Electronically Signed   By: Dorise Bullion III M.D   On: 04/03/2016 11:14   Dg Chest Port 1 View  04/01/2016  CLINICAL DATA:  Respiratory failure. EXAM: PORTABLE CHEST 1 VIEW COMPARISON:  03/31/2016. FINDINGS: Stable cardiomegaly. Persistent right lower lobe atelectasis and/or infiltrate with right pleural effusion. No interim change. No pneumothorax. IMPRESSION: 1. Persistent right lower lobe atelectasis and/or infiltrate with persistent right pleural effusion. 2.  Persistent unchanged cardiomegaly . Electronically Signed   By: Marcello Moores  Register   On: 04/01/2016 07:27   Dg Chest Port 1 View  03/31/2016  CLINICAL DATA:  Respiratory failure. EXAM: PORTABLE CHEST 1 VIEW COMPARISON:  03/29/2016. FINDINGS: Prior cardiac valve replacement. Atrial appendage clip  in stable position. Stable cardiomegaly with normal pulmonary vascularity. Persistent right lower lobe atelectasis and or infiltrate. Persistent right pleural effusion. IMPRESSION: 1. Stable cardiomegaly.  Prior cardiac valve replacement. 2. Persistent right lower lobe infiltrate and right pleural effusion without significant interim change . Electronically Signed   By: Marcello Moores  Register   On: 03/31/2016 07:26   Dg Chest Portable 1 View  03/29/2016  CLINICAL DATA:  Shortness of breath since yesterday. Patient had recent mitro valve  repair in [place]. EXAM: PORTABLE CHEST 1 VIEW COMPARISON:  None. FINDINGS: The mediastinal contour is normal. Heart size enlarged. Mitral valvular replacement ring is noted. There is consolidation of the right mid and lung base. There is a small right pleural effusion. The left lung is clear. No acute abnormalities identified in the visualized bones. IMPRESSION: Consolidation of the right mid and lung base with right pleural effusion for pneumonia. Electronically Signed   By: Abelardo Diesel M.D.   On: 03/29/2016 13:56   ASSESSMENT/PLAN:   Henry Dougherty is a 55 yo previously healthy male with PMHx of atrial fibrillation w RVR s/p MAZE procedure, PFO s/p closure, and Severe MV regurgitation from cord rupture s/p MV repair in April 2017 who was admitted on 5/17 with acute hypoxic respiratory failure 2/2 HCAP, R pleural effusion and atrial flutter with RVR.  Principal Problem:   HCAP (healthcare-associated pneumonia) Active Problems:   Atrial flutter with rapid ventricular response ([place])   Long term (current) use of anticoagulants [Z79.01]   S/P MVR (mitral valve repair)   Hypotension (arterial)   Dyspnea   S/P Maze operation for atrial fibrillation   Gram-positive cocci bacteremia - MRSA   Acute respiratory failure (H[facility])   Acute on chronic combined systolic and diastolic heart failure (HCC)   Valvular cardiomyopathy (HCC)   SOB (shortness of breath)   Acute systolic heart failure [facility])  Henry Dougherty is a 55 yo previously healthy male with PMHx of atrial fibrillation w RVR s/p MAZE procedure, PFO s/p closure, and Severe MV regurgitation from cord rupture s/p MV repair in April 2017 who was admitted on 5/17 with acute hypoxic respiratory failure 2/2 HCAP, R pleural effusion and atrial flutter with RVR. Patient is now in sinus rhythm.   Acute on Chronic HFrEF: Likely 2/2 Valvular Cardiomyopathy and recent atrial flutter with RVR. TEE showed EF of 40-45% and diffuse hypokinesis, improved from TTE.  No history of CAD as evaluated by cath in 2017. UOP -6L yesterday. Weight 331 to 320 this morning. Dyspnea greatly improved. Creatinine stable at 1.7. Still has volume on board, will continue current diuresis with plans for decrease tomorrow. -ASA 81 mg daily -Atorvastatin 40 mg daily -Lasix 80 mg IV TID> decrease to Lasix 80 mg po BID tomorrow -Holding ACEI due to renal function -Repeat BMET tomorrow am  Atypical Atrial Flutter w RVR s/p MAZE Procedure 4/17: Maintaining sinus rhythm, HR in the 80s. Normotensive.   -Telemetry -Continue Amiodarone to 400 mg BID through 5/24, then will decrease to 200 mg BID -Metoprolol 75 mg BID -Restarting coumadin per pharmacy  H/o Severe Mitral Valve Regurgitation s/p MV Repair: Done April 2017 in [place]. TEE on 5/19 showed annular ring prosthesis was present and functioning normally without evidence of vegetation. -Restarting Coumadin, as no more foreseeable procedures  -Heparin gtt  Moderate Pericardial Effusion: TEE showed moderate pericardial effusion was posterior to the heart and along the left ventricular free wall. The fluid exhibited a fibrinous appearance.There was no evidence of hemodynamic compromise. We will  obtain previous hospital records to determine if effusion was present on discharge. Possibly due to post-op changes. -Repeat TTE as outpatient  Coag Negative Staph: Positive BCx in 1/2 with coag negative staph. Possible contaminant. TEE showed no evidence of vegetation concerning for endocarditis. Repeat BCx NGTD. WBC has trended down 22>13 this morning. ID has been consulted.  -On Vanc -ID following -Further management per primary  HTN: Normotensive. -On metoprolol, lasix and amiodarone as above  Acute Hypoxic Respiratory Failure 2/2 HCAP and Rt Pleural Effusion: Likely postop changes. No pleural fluid to tap by ultrasound. CXR showed stable to slightly improved effusion yesterday. -On Lasix 80 mg IV TID> decrease to Lasix 80 mg  po BID tomorrow  Martyn Malay, DO PGY-2 Internal Medicine Resident Pager # [phone] 04/04/2016    The patient has been seen in conjunction with Martyn Malay, D.O. All aspects of care have been considered and discussed. The patient has been personally interviewed, examined, and all clinical data has been reviewed.   Presumed degenerative mitral valve disease treated with repair, Maze procedure for atrial fibrillation, presenting with recurrent atrial fibrillation and acute on chronic combined systolic and diastolic heart failure with EF by TEE of 40-45%. He also has a moderate posterior paracardial effusion and pleural effusions.  Aggressive diuresis as been undertaken, he feels much better. Greater than 10 pounds overnight. Start oral diuretic therapy in a.m.  Plan continue IV diuresis.  IV amiodarone to prevent recurrence of atrial fibrillation. Keep electrolytes intact especially potassium greater than 4 and magnesium greater than 2.0.  With both pericardial and pleural effusions a question of post cardiotomy syndrome is in the differential. He has no discomfort in his chest and therefore pericarditis and pleuritis are felt to be less likely.  Anticoagulation has been started, therefore we need to be mindful of the paracardial effusion is present.  We will begin ambulation and rehabilitation.

## 2016-04-04 NOTE — Progress Notes (Signed)
Spoke with MD this am concerning Enteric precautions, pt has had 3 formed BM's since CDiff order specimen was ordered, no further loose stools as had had (pt states he had eaten melon which always causes him to have loose stools).   Dr Cruzita Lederer had wanted infection prevention"s input in discontinuing the order before he ordered it, he stated pt no longer needed to be on enteric precautions.  After reaching an Infection prevention nurse today and discussing with her, and had already discussed with MD and had an order if IP agreed, enteric precautions will be discontinued.

## 2016-04-04 NOTE — Progress Notes (Signed)
PROGRESS NOTE  Henry Dougherty [id number] DOB: 1961-10-17 DOA: 03/29/2016 PCP: Pcp Not In System   LOS: 6 days   Brief Narrative: 55 year old gentleman with history of severe MR due to a ruptured MV cord, just repaired on 03/17/16 in [place], PFO repair at the same time, also found to have A. fib with RVR status post maze procedure as well as clipping of the left atrial appendage, recently returned home to [place] on 5/13, was seen in Coumadin clinic on 5/16 and was found to be dyspneic, lethargic and was sent to the emergency room. He was admitted initially to ICU due to significant dyspnea and hypotension, initial chest x-ray showed large right effusion with potential consolidation, meaning SIRS criteria, had elevated lactic acid and was admitted with sepsis due to presumed HCAP. 2-D echo done on 5/17 showed an ejection fraction of 30-35% and also showed a large pericardial effusion without hemodynamic compromise. Cardiology was consulted. He was also started on diuresis. In addition to all this, his blood cultures returned positive for coag-negative staph 1 out of 2 bottles. He did well and improved on antibiotics, and was transferred to the hospitalist service on 5/19.  Assessment & Plan: Principal Problem:   HCAP (healthcare-associated pneumonia) Active Problems:   Atrial flutter with rapid ventricular response ([place])   Long term (current) use of anticoagulants [Z79.01]   S/P MVR (mitral valve repair)   Hypotension (arterial)   Dyspnea   S/P Maze operation for atrial fibrillation   Gram-positive cocci bacteremia - MRSA   Acute respiratory failure (HCC)   Acute on chronic combined systolic and diastolic heart failure (HCC)   Valvular cardiomyopathy (HCC)   SOB (shortness of breath)   Acute systolic heart failure (HCC)   Acute on chronic systolic CHF - Plan to obtain records from previous hospitalization, it is not clear if his depressed ejection fraction is new. May be related  to recent surgery. Diuresis per cardiology.  - EF It appears to be improving based on the TEE - Apparently records have arrived to the cardiology clinic and are awaiting to be scanned  PAF status post MAZE procedure and LAA closure - Currently maintaining sinus rhythm. Tele  - On amiodarone 400 twice a day  - Continue Coumadin  Severe MR s/p MV repair 5/14 - TEE as below, looks good  Coag negative staph bacteremia - 1 out of 2 bottles only, this may be a contaminant however given recent extensive intracardiac surgery consulted infectious disease. Appreciate input. - Repeat blood cultures obtained on 5/18 no growth to date - Patient underwent a TEE 5/19, no vegetations seen - continue vancomycin for now, ID to see again today.   Sepsis due to HCAP - On admission patient was hypothermic, tachycardic, hypotensive and with a chest x-ray with infiltrate and pleural effusion he was started on treatment for healthcare associated pneumonia given recent prolonged hospitalization with vancomycin and Zosyn - Consulted infectious disease today to evaluate for bacteremia, appreciate input - Patient denied to me fever or productive cough at home, he states that he has had night sweats since his procedure but no frank fevers. I wonder whether his presentation was related to a true infection or not. I - appreciate ID input.  - discontinued Zosyn since unlike HCAP  Acute hypoxic respiratory failure - Likely multifactorial in the setting of congestive heart failure, significant right-sided pleural effusion and potentially pericardial effusion - Treat underlying issues, wean off his oxygen as tolerated - On room air this morning -  Repeated chest x-ray to reevaluate the right-sided pleural effusion 5/21 shows improved in his pleural effusion on my evaluation  Leukocytosis - Improved  Pericardial effusion - ? Postoperative, again hopefully cardiology would have access to records today in the  office  HTN - soft BP this morning, hold Amlodipine  AKI on probable CKD, unknown grade - unknown baseline renal function however patient tells me that he has had problems with his creatinine during his hospitalization 2 weeks ago and he was monitored very closely - renal function has remained stable     DVT prophylaxis: Coumadin Code Status: Full Family Communication: Extensive d/w wife bedside Disposition Plan: TBD  Consultants:   Cardiology   ID  Procedures:   2D echo 5/17 Study Conclusions - Left ventricle: The cavity size was normal. There was moderate concentric hypertrophy. Systolic function was moderately to severely reduced. The estimated ejection fraction was in the range of 30% to 35%. Diffuse hypokinesis. - Mitral valve: Prior procedures included surgical repair. An annular ring prosthesis was present and functioning normally. Valve area by continuity equation (using LVOT flow): 1.41 cm^2. - Left atrium: The atrium was moderately dilated. - Right atrium: The atrium was mildly dilated. - Pericardium, extracardiac: A large pericardial effusion was identified posterior to the heart 3.1cm and along the left ventricular free wall. The fluid exhibited a fibrinous appearance. There was no evidence of hemodynamic compromise (no RV or RA collpase) however IVC is dilated.   TEE 5/19 Study Conclusions - Left ventricle: Systolic function was mildly to moderately reduced. The estimated ejection fraction was in the range of 40% to 45%. Diffuse hypokinesis. - Aortic valve: No evidence of vegetation. There was mild regurgitation. - Mitral valve: Prior procedures included surgical repair. An annular ring prosthesis was present and functioning normally. No evidence of vegetation. There was trivial to mild regurgitation. - Right atrium: No evidence of thrombus in the atrial cavity or appendage. - Atrial septum: No defect or patent foramen ovale was identified (had prior repair). -  Tricuspid valve: No evidence of vegetation. No evidence of vegetation. There was moderate regurgitation. - Pulmonic valve: No evidence of vegetation. - Pericardium, extracardiac: A moderate pericardial effusion was identified posterior to the heart and along the left ventricular free wall. The fluid exhibited a fibrinous appearance.There was no evidence of hemodynamic compromise.  Antimicrobials:  Vancomycin 5/16 >>  Zosyn 5/16 >>   Subjective: - Able to ambulate last night include a bit more, felt better and not short of breath as before - Denies any chest pain or palpitations - Her an episode of diarrhea last night for which C. difficile was ordered however his stools this morning are formed  Objective: Filed Vitals:   04/03/16 2354 04/03/16 2358 04/04/16 0445 04/04/16 0756  BP: 128/78 128/78 135/73 137/63  Pulse: 90 79 84 81  Temp:   97.8 F (36.6 C) 97.7 F (36.5 C)  TempSrc:   Oral Oral  Resp: 17 23 18 18   Height:      Weight:   145.242 kg (320 lb 3.2 oz)   SpO2: 92% 93% 93% 94%    Intake/Output Summary (Last 24 hours) at 04/04/16 1034 Last data filed at 04/04/16 0700  Gross per 24 hour  Intake 1420.4 ml  Output   6075 ml  Net -4654.6 ml   Filed Weights   04/02/16 0344 04/03/16 0342 04/04/16 0445  Weight: 149.9 kg (330 lb 7.5 oz) 150.141 kg (331 lb) 145.242 kg (320 lb 3.2 oz)  Examination: Constitutional: NAD Filed Vitals:   04/03/16 2354 04/03/16 2358 04/04/16 0445 04/04/16 0756  BP: 128/78 128/78 135/73 137/63  Pulse: 90 79 84 81  Temp:   97.8 F (36.6 C) 97.7 F (36.5 C)  TempSrc:   Oral Oral  Resp: 17 23 18 18   Height:      Weight:   145.242 kg (320 lb 3.2 oz)   SpO2: 92% 93% 93% 94%   Eyes: PERRL, lids and conjunctivae normal ENMT: Mucous membranes are moist. No oropharyngeal exudates Respiratory: clear to auscultation bilaterally, no wheezing, decreased breath sounds on right. Normal respiratory effort. No accessory muscle use.  Cardiovascular:  Regular rate and rhythm, no MRG. 1+ LE edema. 2+ pedal pulses.   Abdomen: no tenderness. Bowel sounds positive.  Musculoskeletal: no clubbing / cyanosis. No joint deformity upper and lower extremities. No contractures. Normal muscle tone.  Skin: no rashes, lesions, ulcers. No induration. Surgical site right chest healing well Neurologic: CN 2-12 grossly intact. Strength 5/5 in all 4.  Psychiatric: Normal judgment and insight. Alert and oriented x 3. Normal mood.    Data Reviewed: I have personally reviewed following labs and imaging studies  CBC:  Recent Labs Lab 03/31/16 0405 04/01/16 0357 04/02/16 0220 04/03/16 0336 04/04/16 0311  WBC 22.1* 18.7* 15.0* 14.0* 13.2*  HGB 10.9* 10.0* 10.2* 10.5* 11.1*  HCT 32.8* 30.7* 31.0* 32.0* 34.2*  MCV 94.8 91.6 91.4 90.9 91.0  PLT 702* 566* 542* 542* 0000000*   Basic Metabolic Panel:  Recent Labs Lab 03/31/16 0405 04/01/16 0357 04/02/16 0220 04/03/16 0336 04/04/16 0311  NA 136 137 138 137 141  K 4.8 3.9 3.8 3.4* 3.6  CL 98* 98* 97* 98* 99*  CO2 26 27 29 27 29   GLUCOSE 159* 125* 134* 145* 128*  BUN 34* 35* 36* 27* 22*  CREATININE 1.79* 1.87* 2.11* 1.75* 1.79*  CALCIUM 8.5* 8.5* 8.3* 8.5* 8.7*  MG  --  2.2  --   --   --   PHOS  --  4.5  --   --   --    GFR: Estimated Creatinine Clearance: 66.4 mL/min (by C-G formula based on Cr of 1.79). Liver Function Tests:  Recent Labs Lab 03/29/16 1250  AST 51*  ALT 112*  ALKPHOS 107  BILITOT 0.7  PROT 7.2  ALBUMIN 3.0*   Coagulation Profile:  Recent Labs Lab 03/31/16 0405 04/01/16 0357 04/02/16 0220 04/03/16 0336 04/04/16 0311  INR 2.95* 2.07* 1.73* 1.49 1.66*   Cardiac Enzymes:  Recent Labs Lab 03/29/16 1630 03/29/16 2315 03/30/16 0207  TROPONINI 0.12* 0.12* 0.14*   CBG:  Recent Labs Lab 03/29/16 2004  GLUCAP 151*   Urine analysis:    Component Value Date/Time   COLORURINE YELLOW 03/29/2016 2041   APPEARANCEUR HAZY* 03/29/2016 2041   LABSPEC 1.023  03/29/2016 2041   PHURINE 5.5 03/29/2016 2041   GLUCOSEU NEGATIVE 03/29/2016 2041   HGBUR NEGATIVE 03/29/2016 2041   [place] NEGATIVE 03/29/2016 2041   [place] NEGATIVE 03/29/2016 2041   PROTEINUR NEGATIVE 03/29/2016 2041   NITRITE NEGATIVE 03/29/2016 2041   LEUKOCYTESUR NEGATIVE 03/29/2016 2041   Sepsis Labs: Invalid input(s): PROCALCITONIN, LACTICIDVEN  Recent Results (from the past 240 hour(s))  Blood Culture (routine x 2)     Status: None   Collection Time: 03/29/16  2:15 PM  Result Value Ref Range Status   Specimen Description BLOOD LEFT ANTECUBITAL  Final   Special Requests BOTTLES DRAWN AEROBIC AND ANAEROBIC 5CC  Final   Culture NO  GROWTH 5 DAYS  Final   Report Status 04/03/2016 FINAL  Final  Blood Culture (routine x 2)     Status: Abnormal   Collection Time: 03/29/16  2:19 PM  Result Value Ref Range Status   Specimen Description BLOOD RIGHT ANTECUBITAL  Final   Special Requests BOTTLES DRAWN AEROBIC AND ANAEROBIC 5CC  Final   Culture  Setup Time   Final    GRAM POSITIVE COCCI IN CLUSTERS IN BOTH AEROBIC AND ANAEROBIC BOTTLES Organism ID to follow CRITICAL RESULT CALLED TO, READ BACK BY AND VERIFIED WITH: C. STEWART, PHARM D AT 1409 ON [id number] BY S. YARBROUGH    Culture (A)  Final    STAPHYLOCOCCUS SPECIES (COAGULASE NEGATIVE) THE SIGNIFICANCE OF ISOLATING THIS ORGANISM FROM A SINGLE SET OF BLOOD CULTURES WHEN MULTIPLE SETS ARE DRAWN IS UNCERTAIN. PLEASE NOTIFY THE MICROBIOLOGY DEPARTMENT WITHIN ONE WEEK IF SPECIATION AND SENSITIVITIES ARE REQUIRED.    Report Status 04/01/2016 FINAL  Final  Blood Culture ID Panel (Reflexed)     Status: Abnormal   Collection Time: 03/29/16  2:19 PM  Result Value Ref Range Status   Enterococcus species NOT DETECTED NOT DETECTED Final   Vancomycin resistance NOT DETECTED NOT DETECTED Final   Listeria monocytogenes NOT DETECTED NOT DETECTED Final   Staphylococcus species DETECTED (A) NOT DETECTED Final    Comment: CRITICAL RESULT  CALLED TO, READ BACK BY AND VERIFIED WITH: C. STEWART,PHARM D AT 1409 ON [id number] BY S. YARBROUGH    Staphylococcus aureus NOT DETECTED NOT DETECTED Final   Methicillin resistance DETECTED (A) NOT DETECTED Final    Comment: CRITICAL RESULT CALLED TO, READ BACK BY AND VERIFIED WITH: C STEWART, PHARM D AT 1409 ON [id number] BY S. YARBROUGH    Streptococcus species NOT DETECTED NOT DETECTED Final   Streptococcus agalactiae NOT DETECTED NOT DETECTED Final   Streptococcus pneumoniae NOT DETECTED NOT DETECTED Final   Streptococcus pyogenes NOT DETECTED NOT DETECTED Final   Acinetobacter baumannii NOT DETECTED NOT DETECTED Final   Enterobacteriaceae species NOT DETECTED NOT DETECTED Final   Enterobacter cloacae complex NOT DETECTED NOT DETECTED Final   Escherichia coli NOT DETECTED NOT DETECTED Final   Klebsiella oxytoca NOT DETECTED NOT DETECTED Final   Klebsiella pneumoniae NOT DETECTED NOT DETECTED Final   Proteus species NOT DETECTED NOT DETECTED Final   Serratia marcescens NOT DETECTED NOT DETECTED Final   Carbapenem resistance NOT DETECTED NOT DETECTED Final   Haemophilus influenzae NOT DETECTED NOT DETECTED Final   Neisseria meningitidis NOT DETECTED NOT DETECTED Final   Pseudomonas aeruginosa NOT DETECTED NOT DETECTED Final   Candida albicans NOT DETECTED NOT DETECTED Final   Candida glabrata NOT DETECTED NOT DETECTED Final   Candida krusei NOT DETECTED NOT DETECTED Final   Candida parapsilosis NOT DETECTED NOT DETECTED Final   Candida tropicalis NOT DETECTED NOT DETECTED Final  MRSA PCR Screening     Status: None   Collection Time: 03/29/16  5:54 PM  Result Value Ref Range Status   MRSA by PCR NEGATIVE NEGATIVE Final    Comment:        The GeneXpert MRSA Assay (FDA approved for NASAL specimens only), is one component of a comprehensive MRSA colonization surveillance program. It is not intended to diagnose MRSA infection nor to guide or monitor treatment for MRSA infections.    Urine culture     Status: None   Collection Time: 03/29/16  8:41 PM  Result Value Ref Range Status   Specimen Description URINE, CLEAN CATCH  Final   Special Requests NONE  Final   Culture NO GROWTH  Final   Report Status 03/31/2016 FINAL  Final  Culture, blood (Routine X 2) w Reflex to ID Panel     Status: None (Preliminary result)   Collection Time: 03/31/16 12:21 PM  Result Value Ref Range Status   Specimen Description BLOOD LEFT HAND  Final   Special Requests IN PEDIATRIC BOTTLE 3CC  Final   Culture NO GROWTH 3 DAYS  Final   Report Status PENDING  Incomplete  Culture, blood (Routine X 2) w Reflex to ID Panel     Status: None (Preliminary result)   Collection Time: 03/31/16 12:29 PM  Result Value Ref Range Status   Specimen Description BLOOD RIGHT HAND  Final   Special Requests IN PEDIATRIC BOTTLE 0.5CC  Final   Culture NO GROWTH 3 DAYS  Final   Report Status PENDING  Incomplete   Radiology Studies: Dg Chest 2 View  04/03/2016  CLINICAL DATA:  Pleural effusion.  Cough. EXAM: CHEST  2 VIEW COMPARISON:  Apr 01, 2016 FINDINGS: There is a persistent right pleural effusion and underlying opacity, similar to slightly improved in the interval. IMPRESSION: Stable to slightly improved moderate size right pleural effusion and underlying opacity. Electronically Signed   By: Dorise Bullion III M.D   On: 04/03/2016 11:14   Scheduled Meds: . amiodarone  400 mg Oral BID  . aspirin EC  81 mg Oral q morning - 10a  . atorvastatin  40 mg Oral QHS  . furosemide  80 mg Intravenous TID  . metoprolol tartrate  75 mg Oral BID  . pantoprazole  40 mg Oral Q1200  . vancomycin  1,000 mg Intravenous Q12H  . warfarin  5 mg Oral ONCE-1800  . Warfarin - Pharmacist Dosing Inpatient   Does not apply q1800   Continuous Infusions: . heparin 2,400 Units/hr (04/04/16 0934)   Time spent: 25 minutes, more than 50% bedside in counseling and discussion  Marzetta Board, MD, PhD [facility] Pager  [phone]  If 7PM-7AM, please contact night-coverage [email or web address] Password [facility] 04/04/2016, 10:34 AM

## 2016-04-04 NOTE — Progress Notes (Addendum)
ANTICOAGULATION CONSULT NOTE - Follow Up Consult  Pharmacy Consult for heparin/warfarin Indication: atrial fibrillation  Allergies  Allergen Reactions  . Amiodarone     Blisters and whelps to IV FORM.    Patient Measurements: Height: 5\' 10"  (177.8 cm) Weight: (!) 320 lb 3.2 oz (145.242 kg) IBW/kg (Calculated) : 73 Heparin dosing weight: 109 kg  Vital Signs: Temp: 97.7 F (36.5 C) (05/22 0756) Temp Source: Oral (05/22 0756) BP: 137/63 mmHg (05/22 0756) Pulse Rate: 81 (05/22 0756)  Labs:  Recent Labs  04/02/16 0220  04/02/16 1558 04/03/16 0336 04/04/16 0311 04/04/16 0613  HGB 10.2*  --   --  10.5* 11.1*  --   HCT 31.0*  --   --  32.0* 34.2*  --   PLT 542*  --   --  542* 489*  --   LABPROT 20.3*  --   --  18.0* 19.6*  --   INR 1.73*  --   --  1.49 1.66*  --   HEPARINUNFRC 0.20*  < > 0.45 0.34  --  0.42  CREATININE 2.11*  --   --  1.75* 1.79*  --   < > = values in this interval not displayed.  Estimated Creatinine Clearance: 66.4 mL/min (by C-G formula based on Cr of 1.79).  Assessment: 55 y/o admitted with PNA. Pt on warfarin PTA for afib. Admit INR 3.58 and today is 1.66 and on heparin bridge back to warfarin. Heparin level remains therapeutic on 2400 units/hr.  Will need to watch INR trend closely with new amiodarone and acute systolic HF.  -amiodarone added 5/17  PTA coumadin dose: 5 mg T/T/S/S and 10 mg M/W/F   Goal of Therapy:  Heparin level 0.3-0.7 units/ml Monitor platelets by anticoagulation protocol: Yes   Plan:  -Coumadin 7.5mg  po today -Daily PT/INR  Hildred Laser, Pharm D 04/04/2016 9:25 AM

## 2016-04-04 NOTE — Progress Notes (Signed)
Pharmacy Antibiotic Note  Henry Dougherty is a 55 y.o. male admitted on 03/29/2016 with pneumonia.  Pharmacy has been consulted for vancomycin dosing.  Vanc trough slightly above goal.  Plan: Vancomycin 1000 IV every 12 hours.  Goal trough 15-20 mcg/mL.  Height: 5\' 10"  (177.8 cm) Weight: (!) 320 lb 3.2 oz (145.242 kg) IBW/kg (Calculated) : 73  Temp (24hrs), Avg:98 F (36.7 C), Min:97.8 F (36.6 C), Max:98.6 F (37 C)   Recent Labs Lab 03/29/16 1347 03/29/16 1611  03/31/16 0405 04/01/16 0357  04/02/16 0220 04/03/16 0336 04/03/16 1850 04/04/16 0311 04/04/16 0613  WBC  --   --   < > 22.1* 18.7*  --  15.0* 14.0*  --  13.2*  --   CREATININE  --   --   < > 1.79* 1.87*  --  2.11* 1.75*  --  1.79*  --   LATICACIDVEN 3.52* 1.94  --   --   --   --   --   --   --   --   --   VANCOTROUGH  --   --   --   --   --   < >  --   --  51*  --  21*  < > = values in this interval not displayed.  Estimated Creatinine Clearance: 66.4 mL/min (by C-G formula based on Cr of 1.79).    Allergies  Allergen Reactions  . Amiodarone     Blisters and whelps to IV FORM.    Antimicrobials this admission: 5/16 cefepime x1  5/16 vanc >  5/17 zosyn > 5/19    Dose adjustments this admission: Vanc 1750mg  Q24H >> trough 10 >> vanc 1250mg  Q12H >> trough 21 >> vanc 1000mg  Q12H  Microbiology results: 5/19 TEE: no vegetations seen  5/16 urine cx: ngtd  5/16 blood cx: 1/2 CoNS  BCID staph species, methicillin resistance  5/16 mrsa: neg  5/18 blood cx x 2 >> NGTD  Thank you for allowing pharmacy to be a part of this patient's care.  Wynona Neat, PharmD, BCPS  04/04/2016 7:37 AM

## 2016-04-04 NOTE — Discharge Instructions (Addendum)
PLEASE WEIGH YOURSELF EVERYDAY. IF YOU GAIN 3-5 POUNDS IN ONE DAY, YOU WILL NEED TO TAKE AN EXTRA LASIX 80 MG TABLET. IF YOU GAIN MORE THAN 5 POUNDS IN A DAY, CALL THE CARDIOLOGY OFFICE. IF YOU LOSE MORE THAN 5 POUNDS IN A DAY, PLEASE CALL THE OFFICE.  TAKE LASIX 80 MG ONCE A DAY. TAKE AMIODARONE 200 MG TWICE A DAY FOR 2 WEEKS. WE WILL DECREASE IT AFTER THAT.  TAKE ALL OTHER MEDICATIONS AS PRESCRIBED. I HAVE FAXED YOUR INFORMATION TO YOUR CARDIOLOGIST IN [place].  FOLLOW UP WITH LUKE KILROY AS LISTED IN THIS PACKET ON 04/13/16. THEY WILL DRAW YOUR LABS AFTER THE APPOINTMENT.   Information on my medicine - Coumadin   (Warfarin)  This medication education was reviewed with me or my healthcare representative as part of my discharge preparation.  The pharmacist that spoke with me during my hospital stay was:  Dareen Piano, [facility]  Why was Coumadin prescribed for you? Coumadin was prescribed for you because you have a blood clot or a medical condition that can cause an increased risk of forming blood clots. Blood clots can cause serious health problems by blocking the flow of blood to the heart, lung, or brain. Coumadin can prevent harmful blood clots from forming. As a reminder your indication for Coumadin is:   Stroke Prevention Because Of Atrial Fibrillation  What test will check on my response to Coumadin? While on Coumadin (warfarin) you will need to have an INR test regularly to ensure that your dose is keeping you in the desired range. The INR (international normalized ratio) number is calculated from the result of the laboratory test called prothrombin time (PT).  If an INR APPOINTMENT HAS NOT ALREADY BEEN MADE FOR YOU please schedule an appointment to have this lab work done by your health care provider within 7 days. Your INR goal is usually a number between:  2 to 3 or your provider may give you a more narrow range like 2-2.5.  Ask your health care provider during an office visit  what your goal INR is.  What  do you need to  know  About  COUMADIN? Take Coumadin (warfarin) exactly as prescribed by your healthcare provider about the same time each day.  DO NOT stop taking without talking to the doctor who prescribed the medication.  Stopping without other blood clot prevention medication to take the place of Coumadin may increase your risk of developing a new clot or stroke.  Get refills before you run out.  What do you do if you miss a dose? If you miss a dose, take it as soon as you remember on the same day then continue your regularly scheduled regimen the next day.  Do not take two doses of Coumadin at the same time.  Important Safety Information A possible side effect of Coumadin (Warfarin) is an increased risk of bleeding. You should call your healthcare provider right away if you experience any of the following: ? Bleeding from an injury or your nose that does not stop. ? Unusual colored urine (red or dark brown) or unusual colored stools (red or black). ? Unusual bruising for unknown reasons. ? A serious fall or if you hit your head (even if there is no bleeding).  Some foods or medicines interact with Coumadin (warfarin) and might alter your response to warfarin. To help avoid this: ? Eat a balanced diet, maintaining a consistent amount of Vitamin K. ? Notify your provider about major diet  changes you plan to make. ? Avoid alcohol or limit your intake to 1 drink for women and 2 drinks for men per day. (1 drink is 5 oz. wine, 12 oz. beer, or 1.5 oz. liquor.)  Make sure that ANY health care provider who prescribes medication for you knows that you are taking Coumadin (warfarin).  Also make sure the healthcare provider who is monitoring your Coumadin knows when you have started a new medication including herbals and non-prescription products.  Coumadin (Warfarin)  Major Drug Interactions  Increased Warfarin Effect Decreased Warfarin Effect  Alcohol (large  quantities) Antibiotics (esp. Septra/Bactrim, Flagyl, Cipro) Amiodarone (Cordarone) Aspirin (ASA) Cimetidine (Tagamet) Megestrol (Megace) NSAIDs (ibuprofen, naproxen, etc.) Piroxicam (Feldene) Propafenone (Rythmol SR) Propranolol (Inderal) Isoniazid (INH) Posaconazole (Noxafil) Barbiturates (Phenobarbital) Carbamazepine (Tegretol) Chlordiazepoxide (Librium) Cholestyramine (Questran) Griseofulvin Oral Contraceptives Rifampin Sucralfate (Carafate) Vitamin K   Coumadin (Warfarin) Major Herbal Interactions  Increased Warfarin Effect Decreased Warfarin Effect  Garlic Ginseng Ginkgo biloba Coenzyme Q10 Green tea St. Johns wort    Coumadin (Warfarin) FOOD Interactions  Eat a consistent number of servings per week of foods HIGH in Vitamin K (1 serving =  cup)  Collards (cooked, or boiled & drained) Kale (cooked, or boiled & drained) Mustard greens (cooked, or boiled & drained) Parsley *serving size only =  cup Spinach (cooked, or boiled & drained) Swiss chard (cooked, or boiled & drained) Turnip greens (cooked, or boiled & drained)  Eat a consistent number of servings per week of foods MEDIUM-HIGH in Vitamin K (1 serving = 1 cup)  Asparagus (cooked, or boiled & drained) Broccoli (cooked, boiled & drained, or raw & chopped) Brussel sprouts (cooked, or boiled & drained) *serving size only =  cup Lettuce, raw (green leaf, endive, romaine) Spinach, raw Turnip greens, raw & chopped   These websites have more information on Coumadin (warfarin):  FailFactory.se; VeganReport.com.au;

## 2016-04-04 NOTE — Progress Notes (Signed)
Pt has order for air overlay mattress. Adv homecare will ck on this and set up for pt. Spoke w Brenton Grills w [facility] and he will follow up.

## 2016-04-05 ENCOUNTER — Inpatient Hospital Stay (HOSPITAL_COMMUNITY): Payer: BLUE CROSS/BLUE SHIELD

## 2016-04-05 ENCOUNTER — Ambulatory Visit: Payer: BLUE CROSS/BLUE SHIELD | Admitting: Pharmacist Clinician (PhC)/ Clinical Pharmacy Specialist

## 2016-04-05 LAB — BASIC METABOLIC PANEL
ANION GAP: 10 (ref 5–15)
BUN: 21 mg/dL — AB (ref 6–20)
CALCIUM: 8.9 mg/dL (ref 8.9–10.3)
CO2: 35 mmol/L — AB (ref 22–32)
Chloride: 94 mmol/L — ABNORMAL LOW (ref 101–111)
Creatinine, Ser: 2.2 mg/dL — ABNORMAL HIGH (ref 0.61–1.24)
GFR calc Af Amer: 37 mL/min — ABNORMAL LOW (ref >60)
GFR calc non Af Amer: 32 mL/min — ABNORMAL LOW (ref >60)
GLUCOSE: 134 mg/dL — AB (ref 65–99)
Potassium: 3.4 mmol/L — ABNORMAL LOW (ref 3.5–5.1)
Sodium: 139 mmol/L (ref 135–145)

## 2016-04-05 LAB — CULTURE, BLOOD (ROUTINE X 2)
CULTURE: NO GROWTH
Culture: NO GROWTH

## 2016-04-05 LAB — CBC
HCT: 35.7 % — ABNORMAL LOW (ref 39.0–52.0)
Hemoglobin: 11.7 g/dL — ABNORMAL LOW (ref 13.0–17.0)
MCH: 29.8 pg (ref 26.0–34.0)
MCHC: 32.8 g/dL (ref 30.0–36.0)
MCV: 91.1 fL (ref 78.0–100.0)
PLATELETS: 463 10*3/uL — AB (ref 150–400)
RBC: 3.92 MIL/uL — AB (ref 4.22–5.81)
RDW: 12.8 % (ref 11.5–15.5)
WBC: 13.5 10*3/uL — ABNORMAL HIGH (ref 4.0–10.5)

## 2016-04-05 LAB — PROTIME-INR
INR: 1.92 — ABNORMAL HIGH (ref 0.00–1.49)
Prothrombin Time: 21.9 seconds — ABNORMAL HIGH (ref 11.6–15.2)

## 2016-04-05 LAB — HEPARIN LEVEL (UNFRACTIONATED): HEPARIN UNFRACTIONATED: 0.39 [IU]/mL (ref 0.30–0.70)

## 2016-04-05 MED ORDER — WARFARIN SODIUM 5 MG PO TABS
5.0000 mg | ORAL_TABLET | Freq: Once | ORAL | Status: AC
Start: 2016-04-05 — End: 2016-04-05
  Administered 2016-04-05: 5 mg via ORAL
  Filled 2016-04-05: qty 1

## 2016-04-05 MED ORDER — FUROSEMIDE 80 MG PO TABS
80.0000 mg | ORAL_TABLET | Freq: Two times a day (BID) | ORAL | Status: DC
  Administered 2016-04-05: 80 mg via ORAL
  Filled 2016-04-05: qty 1

## 2016-04-05 MED ORDER — POTASSIUM CHLORIDE CRYS ER 20 MEQ PO TBCR
20.0000 meq | EXTENDED_RELEASE_TABLET | Freq: Once | ORAL | Status: AC
  Administered 2016-04-05: 20 meq via ORAL
  Filled 2016-04-05: qty 1

## 2016-04-05 MED ORDER — OFF THE BEAT BOOK
Freq: Once | Status: AC
  Administered 2016-04-05: 13:00:00
  Filled 2016-04-05: qty 1

## 2016-04-05 NOTE — Plan of Care (Addendum)
Problem: Education: Goal: Knowledge of disease or condition will improve Outcome: Completed/Met Date Met:  04/05/16 Pt given "Off the Beat" booklet. Pt had afib RVR early in admission, (pt no longer in afib), pt asked for and information given to and reviewed with pt about afib and what to look for in symptoms if he was to go into afib again after discharge home.

## 2016-04-05 NOTE — Progress Notes (Signed)
Pt was transferred to unit [place], room 3E19 via bed with his belongings.

## 2016-04-05 NOTE — Progress Notes (Signed)
CHF education reviewed with pt, CHF packet given to pt.

## 2016-04-05 NOTE — Progress Notes (Addendum)
HISTORY: Henry Dougherty is a 55 yo previously healthy male with PMHx of atrial fibrillation w RVR s/p MAZE procedure, PFO s/p closure, and Severe MV regurgitation from cord rupture s/p MV repair in April 2017 who was admitted on 5/17 with acute hypoxic respiratory failure 2/2 HCAP, R pleural effusion and atrial flutter with RVR. Patient is now in sinus rhythm. Blood cx were positive for coag negative staph in 1/2 bottles.  SUBJECTIVE:   Patient was seen and examined this morning. He denies chest pain, shortness of breath, nausea, palpitations, or lightheadedness. He wants to go home.  OBJECTIVE:   Vitals:   Filed Vitals:   04/05/16 0500 04/05/16 0525 04/05/16 0852 04/05/16 0939  BP:  121/90 143/90 127/74  Pulse: 79 86 91 96  Temp:  97.6 F (36.4 C) 98.4 F (36.9 C)   TempSrc:  Axillary Oral   Resp: 17  18 17   Height:      Weight: 312 lb 9.6 oz (141.794 kg)     SpO2: 91% 97%  97%   I&O's:    Intake/Output Summary (Last 24 hours) at 04/05/16 1046 Last data filed at 04/05/16 0901  Gross per 24 hour  Intake   2342 ml  Output   5750 ml  Net  -3408 ml   TELEMETRY: Sinus rhythm, HR 80s, PVCs.  PHYSICAL EXAM General: Vital signs reviewed.  Patient is obese, in no acute distress and cooperative with exam.  Neck: no JVD or carotid bruit present.  Cardiovascular: RRR, S1 normal, S2 normal, no murmurs, gallops, or rubs. Pulmonary/Chest: Decreased breath sounds in right lower lung field, no wheezing, rhonchi or rales. Abdominal: Soft, non-tender, non-distended, BS + Extremities: Trace lower extremity edema bilaterally, pulses symmetric and intact bilaterally. Skin: Warm, dry and intact.  LABS: Basic Metabolic Panel:  Recent Labs  04/04/16 0311 04/05/16 0237  NA 141 139  K 3.6 3.4*  CL 99* 94*  CO2 29 35*  GLUCOSE 128* 134*  BUN 22* 21*  CREATININE 1.79* 2.20*  CALCIUM 8.7* 8.9   CBC:  Recent Labs  04/04/16 0311 04/05/16 0237  WBC 13.2* 13.5*  HGB 11.1* 11.7*  HCT  34.2* 35.7*  MCV 91.0 91.1  PLT 489* 463*   Coag Panel:   Lab Results  Component Value Date   INR 1.92* 04/05/2016   INR 1.66* 04/04/2016   INR 1.49 04/03/2016    RADIOLOGY: Dg Chest 2 View  04/05/2016  CLINICAL DATA:  Pleural effusion; intermittent SOB today; non-smoker EXAM: CHEST  2 VIEW COMPARISON:  04/03/2016 FINDINGS: Stable cardiac enlargement with replacement cardiac valve. Extensive opacity over the right lung occupying lower half of the right thorax consisting of pleural effusion and consolidation. IMPRESSION: Stable right effusion and consolidation Electronically Signed   By: Skipper Cliche M.D.   On: 04/05/2016 08:31   Dg Chest 2 View  04/03/2016  CLINICAL DATA:  Pleural effusion.  Cough. EXAM: CHEST  2 VIEW COMPARISON:  Apr 01, 2016 FINDINGS: There is a persistent right pleural effusion and underlying opacity, similar to slightly improved in the interval. IMPRESSION: Stable to slightly improved moderate size right pleural effusion and underlying opacity. Electronically Signed   By: Dorise Bullion III M.D   On: 04/03/2016 11:14   Dg Chest Port 1 View  04/01/2016  CLINICAL DATA:  Respiratory failure. EXAM: PORTABLE CHEST 1 VIEW COMPARISON:  03/31/2016. FINDINGS: Stable cardiomegaly. Persistent right lower lobe atelectasis and/or infiltrate with right pleural effusion. No interim change. No pneumothorax. IMPRESSION: 1. Persistent  right lower lobe atelectasis and/or infiltrate with persistent right pleural effusion. 2.  Persistent unchanged cardiomegaly . Electronically Signed   By: Marcello Moores  Register   On: 04/01/2016 07:27   Dg Chest Port 1 View  03/31/2016  CLINICAL DATA:  Respiratory failure. EXAM: PORTABLE CHEST 1 VIEW COMPARISON:  03/29/2016. FINDINGS: Prior cardiac valve replacement. Atrial appendage clip in stable position. Stable cardiomegaly with normal pulmonary vascularity. Persistent right lower lobe atelectasis and or infiltrate. Persistent right pleural effusion.  IMPRESSION: 1. Stable cardiomegaly.  Prior cardiac valve replacement. 2. Persistent right lower lobe infiltrate and right pleural effusion without significant interim change . Electronically Signed   By: Marcello Moores  Register   On: 03/31/2016 07:26   Dg Chest Portable 1 View  03/29/2016  CLINICAL DATA:  Shortness of breath since yesterday. Patient had recent mitro valve repair in [place]. EXAM: PORTABLE CHEST 1 VIEW COMPARISON:  None. FINDINGS: The mediastinal contour is normal. Heart size enlarged. Mitral valvular replacement ring is noted. There is consolidation of the right mid and lung base. There is a small right pleural effusion. The left lung is clear. No acute abnormalities identified in the visualized bones. IMPRESSION: Consolidation of the right mid and lung base with right pleural effusion for pneumonia. Electronically Signed   By: Abelardo Diesel M.D.   On: 03/29/2016 13:56   ASSESSMENT/PLAN:   Henry Dougherty is a 55 yo previously healthy male with PMHx of atrial fibrillation w RVR s/p MAZE procedure, PFO s/p closure, and Severe MV regurgitation from cord rupture s/p MV repair in April 2017 who was admitted on 5/17 with acute hypoxic respiratory failure 2/2 HCAP, R pleural effusion and atrial flutter with RVR. Patient is now in sinus rhythm.   Acute on Chronic HFrEF: EF of 40-45% and diffuse hypokinesis, improved from TTE. UOP -5.7 L yesterday. Weight down another 8 pounds (320>312) this morning. Creatinine bumped this morning from 1.7 to 2.2 due to diuresis. Patient received Lasix 80 mg po once this morning. We will hold Lasix and reassess tomorrow morning. -Hold Lasix, reassess tomorrow morning -ASA 81 mg daily -Atorvastatin 40 mg daily -Holding ACEI due to renal function -Repeat BMET tomorrow am -Transfer to telemetry  AKI: Unclear baseline. Patient's creatinine had been stable at 1.7 and increased to 2.2 this morning. Likely secondary to aggressive diuresis. Patient received Lasix 80 mg po  once this morning. We will hold Lasix and reassess tomorrow morning. -Hold Lasix, reassess tomorrow morning -Repeat BMET tomorrow am -Holding ACEI  Atypical Atrial Flutter w RVR s/p MAZE Procedure 4/17: Maintaining sinus rhythm, HR in the 80s. INR 1.9 this morning.  -Telemetry -Continue Amiodarone to 400 mg BID through 5/24, then will decrease to 200 mg BID -Metoprolol 75 mg BID -On Coumadin  H/o Severe Mitral Valve Regurgitation s/p MV Repair: Done April 2017 in [place]. TEE on 5/19 showed annular ring prosthesis was present and functioning normally without evidence of vegetation. INR 1.9 this morning. -On Coumadin -Heparin gtt  Moderate Pericardial Effusion: TEE showed moderate fibrinous pericardial effusion posterior to the heart and along the left ventricular free wall without evidence of hemodynamic compromise.  -Repeat TTE as outpatient  Coag Negative Staph: Positive BCx in 1/2 with coag negative staph. TEE showed no evidence of vegetation concerning for endocarditis. ID recommends treating for bacteremia. -On Vancomycin -Plans to start Linezolid BID po on 5/24 for 6 days -ID following  HTN: 120-140s/90s this morning.  -On metoprolol and amiodarone as above -Holding ACEI and Lasix due to renal insufficiency  Martyn Malay, DO PGY-2 Internal Medicine Resident Pager # [phone] 04/05/2016  The patient has been seen in conjunction with Alexa Burns, DO. All aspects of care have been considered and discussed. The patient has been personally interviewed, examined, and all clinical data has been reviewed.   No further lasix today due to renal bump.  Transfer to telemetry.  Establish diuretic regimen in AM prior to DC.

## 2016-04-05 NOTE — Progress Notes (Signed)
Potassium 3.4. [facility] notified: new order for 92mEq potassium chloride tablet.

## 2016-04-05 NOTE — Progress Notes (Signed)
Pt may go home on zyvox. Gave pt inform and phone number to call [employer] to get copay card to assist w bcbs to make copay reasonalble.

## 2016-04-05 NOTE — Progress Notes (Signed)
ANTICOAGULATION CONSULT NOTE - Follow Up Consult  Pharmacy Consult for heparin/warfarin Indication: atrial fibrillation  Allergies  Allergen Reactions  . Amiodarone     Blisters and whelps to IV FORM.    Patient Measurements: Height: 5\' 10"  (177.8 cm) Weight: (!) 312 lb 9.6 oz (141.794 kg) IBW/kg (Calculated) : 73 Heparin dosing weight: 109 kg  Vital Signs: Temp: 97.6 F (36.4 C) (05/23 0525) Temp Source: Axillary (05/23 0525) BP: 121/90 mmHg (05/23 0525) Pulse Rate: 86 (05/23 0525)  Labs:  Recent Labs  04/03/16 0336 04/04/16 0311 04/04/16 0613 04/05/16 0237  HGB 10.5* 11.1*  --  11.7*  HCT 32.0* 34.2*  --  35.7*  PLT 542* 489*  --  463*  LABPROT 18.0* 19.6*  --  21.9*  INR 1.49 1.66*  --  1.92*  HEPARINUNFRC 0.34  --  0.42 0.39  CREATININE 1.75* 1.79*  --  2.20*    Estimated Creatinine Clearance: 53.3 mL/min (by C-G formula based on Cr of 2.2).  Assessment: 55 y/o admitted with PNA. Pt on warfarin PTA for afib. Admit INR 3.58 and today is 1.92 and on heparin bridge back to warfarin. Heparin level remains therapeutic on 2400 units/hr.  Will need to watch INR trend closely with new amiodarone and acute systolic HF. Amiodarone added 5/17  PTA coumadin dose: 5 mg T/T/S/S and 10 mg M/W/F   Goal of Therapy:  Heparin level 0.3-0.7 units/ml Monitor platelets by anticoagulation protocol: Yes   Plan:  -Coumadin 5mg  po x 1 tonight -Continue heparin gtt at 2400 units/hr -Daily HL/INR/CBC -Monitor for S&S of bleed  Angela Burke, PharmD Pharmacy Resident Pager: [phone]  04/05/2016 7:44 AM

## 2016-04-05 NOTE — Progress Notes (Signed)
PROGRESS NOTE  Henry Dougherty [id number] DOB: Sep 28, 1961 DOA: 03/29/2016 PCP: Pcp Not In System   LOS: 7 days   Brief Narrative: 55 year old gentleman with history of severe MR due to a ruptured MV cord, just repaired on 03/17/16 in [place], PFO repair at the same time, also found to have A. fib with RVR status post maze procedure as well as clipping of the left atrial appendage, recently returned home to [place] on 5/13, was seen in Coumadin clinic on 5/16 and was found to be dyspneic, lethargic and was sent to the emergency room. He was admitted initially to ICU due to significant dyspnea and hypotension, initial chest x-ray showed large right effusion with potential consolidation, meeting SIRS criteria, had elevated lactic acid and was admitted with sepsis due to presumed HCAP. 2-D echo done on 5/17 showed an ejection fraction of 30-35% and also showed a large pericardial effusion without hemodynamic compromise. Cardiology was consulted. He was also started on diuresis. In addition to all this, his blood cultures returned positive for coag-negative staph 1 out of 2 bottles. ID was consulted. He did well and improved on antibiotics, and was transferred to the hospitalist service on 5/19. He continued to be diuresed, improved, converted to po diuretics on 5/23 with plans for home discharge 5/24 if remains stable.  Assessment & Plan: Principal Problem:   HCAP (healthcare-associated pneumonia) Active Problems:   Atrial flutter with rapid ventricular response ([place])   Long term (current) use of anticoagulants [Z79.01]   S/P MVR (mitral valve repair)   Hypotension (arterial)   Dyspnea   S/P Maze operation for atrial fibrillation   Gram-positive cocci bacteremia - MRSA   Acute respiratory failure (HCC)   Acute on chronic combined systolic and diastolic heart failure (HCC)   Valvular cardiomyopathy (HCC)   SOB (shortness of breath)   Acute systolic heart failure (HCC)   Pleural  effusion   Acute on chronic systolic CHF - it is not clear if his depressed ejection fraction is new, he recently had surgery in [place] 2 weeks ago and may be periop, related to recent surgery. Patient was aggressively diuresed with IV Lasix and is net negative also 10 L and his weight went from about 330 to 312. Renal function has remained overall stable, with mild Cr bump as expected. Had a TEE and his EF in fact appears to be improving now.  - Apparently records have arrived to the cardiology clinic and are awaiting to be scanned  PAF status post MAZE procedure and LAA closure - Currently maintaining sinus rhythm. Tele  - On amiodarone - Continue Coumadin  Severe MR s/p MV repair 5/14 - TEE as below, looks good  Coag negative staph bacteremia - 1 out of 2 bottles only, this may be a contaminant however given recent extensive intracardiac surgery - Repeat blood cultures obtained on 5/18 no growth to date - Patient underwent a TEE 5/19, no vegetations seen - ID consulted, recommending 7 days of Vanc, which last day is today then 7 more days of linezolid to start 5/24  Sepsis due to HCAP - On admission patient was hypothermic, tachycardic, hypotensive and with a chest x-ray with infiltrate and pleural effusion he was started on treatment for healthcare associated pneumonia given recent prolonged hospitalization with vancomycin and Zosyn - Patient denied to me fever or productive cough at home, he states that he has had night sweats since his procedure but no frank fevers. I wonder whether his presentation was related to a  true infection or not. I - discontinued Zosyn since unlike HCAP  Acute hypoxic respiratory failure - Likely multifactorial in the setting of congestive heart failure, significant right-sided pleural effusion and potentially pericardial effusion. Resolved, on room air.   Leukocytosis - Improved  Pericardial effusion - ? Postoperative, again hopefully cardiology would have  access to records today in the office  HTN - soft BP this morning, hold Amlodipine  AKI on probable CKD, unknown grade - unknown baseline renal function however patient tells me that he has had problems with his creatinine during his hospitalization 2 weeks ago and he was monitored very closely - renal function has remained stable, Cr with bump today as expected due to IV diuresis - change to PO Furosemide tomorrow, anticipate home d/c     DVT prophylaxis: Coumadin Code Status: Full Family Communication: Extensive d/w wife bedside Disposition Plan: TBD  Consultants:   Cardiology   ID  Procedures:   2D echo 5/17 Study Conclusions - Left ventricle: The cavity size was normal. There was moderate concentric hypertrophy. Systolic function was moderately to severely reduced. The estimated ejection fraction was in the range of 30% to 35%. Diffuse hypokinesis. - Mitral valve: Prior procedures included surgical repair. An annular ring prosthesis was present and functioning normally. Valve area by continuity equation (using LVOT flow): 1.41 cm^2. - Left atrium: The atrium was moderately dilated. - Right atrium: The atrium was mildly dilated. - Pericardium, extracardiac: A large pericardial effusion was identified posterior to the heart 3.1cm and along the left ventricular free wall. The fluid exhibited a fibrinous appearance. There was no evidence of hemodynamic compromise (no RV or RA collpase) however IVC is dilated.   TEE 5/19 Study Conclusions - Left ventricle: Systolic function was mildly to moderately reduced. The estimated ejection fraction was in the range of 40% to 45%. Diffuse hypokinesis. - Aortic valve: No evidence of vegetation. There was mild regurgitation. - Mitral valve: Prior procedures included surgical repair. An annular ring prosthesis was present and functioning normally. No evidence of vegetation. There was trivial to mild regurgitation. - Right atrium: No evidence  of thrombus in the atrial cavity or appendage. - Atrial septum: No defect or patent foramen ovale was identified (had prior repair). - Tricuspid valve: No evidence of vegetation. No evidence of vegetation. There was moderate regurgitation. - Pulmonic valve: No evidence of vegetation. - Pericardium, extracardiac: A moderate pericardial effusion was identified posterior to the heart and along the left ventricular free wall. The fluid exhibited a fibrinous appearance.There was no evidence of hemodynamic compromise.  Antimicrobials:  Vancomycin 5/16 >>  Zosyn 5/16 >>   Subjective: - wants to go home as soon as possible  Objective: Filed Vitals:   04/05/16 0525 04/05/16 0852 04/05/16 0939 04/05/16 1108  BP: 121/90 143/90 127/74 133/74  Pulse: 86 91 96 77  Temp: 97.6 F (36.4 C) 98.4 F (36.9 C)  97.5 F (36.4 C)  TempSrc: Axillary Oral  Oral  Resp:  18 17 20   Height:      Weight:      SpO2: 97%  97% 96%    Intake/Output Summary (Last 24 hours) at 04/05/16 1305 Last data filed at 04/05/16 1218  Gross per 24 hour  Intake 1814.8 ml  Output   4500 ml  Net -2685.2 ml   Filed Weights   04/03/16 0342 04/04/16 0445 04/05/16 0500  Weight: 150.141 kg (331 lb) 145.242 kg (320 lb 3.2 oz) 141.794 kg (312 lb 9.6 oz)  Examination: Constitutional: NAD Filed Vitals:   04/05/16 0525 04/05/16 0852 04/05/16 0939 04/05/16 1108  BP: 121/90 143/90 127/74 133/74  Pulse: 86 91 96 77  Temp: 97.6 F (36.4 C) 98.4 F (36.9 C)  97.5 F (36.4 C)  TempSrc: Axillary Oral  Oral  Resp:  18 17 20   Height:      Weight:      SpO2: 97%  97% 96%   Eyes: PERRL, lids and conjunctivae normal ENMT: Mucous membranes are moist. No oropharyngeal exudates Respiratory: clear to auscultation bilaterally, no wheezing, decreased breath sounds on right. Normal respiratory effort. No accessory muscle use.  Cardiovascular: Regular rate and rhythm, no MRG. Trace LE edema. 2+ pedal pulses.   Abdomen: no  tenderness. Bowel sounds positive.  Musculoskeletal: no clubbing / cyanosis. No joint deformity upper and lower extremities. No contractures. Normal muscle tone.  Skin: no rashes, lesions, ulcers. No induration. Surgical site right chest healing well   Data Reviewed: I have personally reviewed following labs and imaging studies  CBC:  Recent Labs Lab 04/01/16 0357 04/02/16 0220 04/03/16 0336 04/04/16 0311 04/05/16 0237  WBC 18.7* 15.0* 14.0* 13.2* 13.5*  HGB 10.0* 10.2* 10.5* 11.1* 11.7*  HCT 30.7* 31.0* 32.0* 34.2* 35.7*  MCV 91.6 91.4 90.9 91.0 91.1  PLT 566* 542* 542* 489* [id number]*   Basic Metabolic Panel:  Recent Labs Lab 04/01/16 0357 04/02/16 0220 04/03/16 0336 04/04/16 0311 04/05/16 0237  NA 137 138 137 141 139  K 3.9 3.8 3.4* 3.6 3.4*  CL 98* 97* 98* 99* 94*  CO2 27 29 27 29  35*  GLUCOSE 125* 134* 145* 128* 134*  BUN 35* 36* 27* 22* 21*  CREATININE 1.87* 2.11* 1.75* 1.79* 2.20*  CALCIUM 8.5* 8.3* 8.5* 8.7* 8.9  MG 2.2  --   --   --   --   PHOS 4.5  --   --   --   --    GFR: Estimated Creatinine Clearance: 53.3 mL/min (by C-G formula based on Cr of 2.2). Liver Function Tests: No results for input(s): AST, ALT, ALKPHOS, BILITOT, PROT, ALBUMIN in the last 168 hours. Coagulation Profile:  Recent Labs Lab 04/01/16 0357 04/02/16 0220 04/03/16 0336 04/04/16 0311 04/05/16 0237  INR 2.07* 1.73* 1.49 1.66* 1.92*   Cardiac Enzymes:  Recent Labs Lab 03/29/16 1630 03/29/16 2315 03/30/16 0207  TROPONINI 0.12* 0.12* 0.14*   CBG:  Recent Labs Lab 03/29/16 2004  GLUCAP 151*   Urine analysis:    Component Value Date/Time   COLORURINE YELLOW 03/29/2016 2041   APPEARANCEUR HAZY* 03/29/2016 2041   LABSPEC 1.023 03/29/2016 2041   PHURINE 5.5 03/29/2016 2041   GLUCOSEU NEGATIVE 03/29/2016 2041   HGBUR NEGATIVE 03/29/2016 2041   [place] NEGATIVE 03/29/2016 2041   [place] NEGATIVE 03/29/2016 2041   PROTEINUR NEGATIVE 03/29/2016 2041   NITRITE  NEGATIVE 03/29/2016 2041   LEUKOCYTESUR NEGATIVE 03/29/2016 2041   Sepsis Labs: Invalid input(s): PROCALCITONIN, LACTICIDVEN  Recent Results (from the past 240 hour(s))  Blood Culture (routine x 2)     Status: None   Collection Time: 03/29/16  2:15 PM  Result Value Ref Range Status   Specimen Description BLOOD LEFT ANTECUBITAL  Final   Special Requests BOTTLES DRAWN AEROBIC AND ANAEROBIC 5CC  Final   Culture NO GROWTH 5 DAYS  Final   Report Status 04/03/2016 FINAL  Final  Blood Culture (routine x 2)     Status: Abnormal   Collection Time: 03/29/16  2:19 PM  Result Value Ref  Range Status   Specimen Description BLOOD RIGHT ANTECUBITAL  Final   Special Requests BOTTLES DRAWN AEROBIC AND ANAEROBIC 5CC  Final   Culture  Setup Time   Final    GRAM POSITIVE COCCI IN CLUSTERS IN BOTH AEROBIC AND ANAEROBIC BOTTLES Organism ID to follow CRITICAL RESULT CALLED TO, READ BACK BY AND VERIFIED WITH: C. STEWART, PHARM D AT 1409 ON [id number] BY S. YARBROUGH    Culture (A)  Final    STAPHYLOCOCCUS SPECIES (COAGULASE NEGATIVE) THE SIGNIFICANCE OF ISOLATING THIS ORGANISM FROM A SINGLE SET OF BLOOD CULTURES WHEN MULTIPLE SETS ARE DRAWN IS UNCERTAIN. PLEASE NOTIFY THE MICROBIOLOGY DEPARTMENT WITHIN ONE WEEK IF SPECIATION AND SENSITIVITIES ARE REQUIRED.    Report Status 04/01/2016 FINAL  Final  Blood Culture ID Panel (Reflexed)     Status: Abnormal   Collection Time: 03/29/16  2:19 PM  Result Value Ref Range Status   Enterococcus species NOT DETECTED NOT DETECTED Final   Vancomycin resistance NOT DETECTED NOT DETECTED Final   Listeria monocytogenes NOT DETECTED NOT DETECTED Final   Staphylococcus species DETECTED (A) NOT DETECTED Final    Comment: CRITICAL RESULT CALLED TO, READ BACK BY AND VERIFIED WITH: C. STEWART,PHARM D AT 1409 ON [id number] BY S. YARBROUGH    Staphylococcus aureus NOT DETECTED NOT DETECTED Final   Methicillin resistance DETECTED (A) NOT DETECTED Final    Comment: CRITICAL RESULT  CALLED TO, READ BACK BY AND VERIFIED WITH: C STEWART, PHARM D AT 1409 ON [id number] BY S. YARBROUGH    Streptococcus species NOT DETECTED NOT DETECTED Final   Streptococcus agalactiae NOT DETECTED NOT DETECTED Final   Streptococcus pneumoniae NOT DETECTED NOT DETECTED Final   Streptococcus pyogenes NOT DETECTED NOT DETECTED Final   Acinetobacter baumannii NOT DETECTED NOT DETECTED Final   Enterobacteriaceae species NOT DETECTED NOT DETECTED Final   Enterobacter cloacae complex NOT DETECTED NOT DETECTED Final   Escherichia coli NOT DETECTED NOT DETECTED Final   Klebsiella oxytoca NOT DETECTED NOT DETECTED Final   Klebsiella pneumoniae NOT DETECTED NOT DETECTED Final   Proteus species NOT DETECTED NOT DETECTED Final   Serratia marcescens NOT DETECTED NOT DETECTED Final   Carbapenem resistance NOT DETECTED NOT DETECTED Final   Haemophilus influenzae NOT DETECTED NOT DETECTED Final   Neisseria meningitidis NOT DETECTED NOT DETECTED Final   Pseudomonas aeruginosa NOT DETECTED NOT DETECTED Final   Candida albicans NOT DETECTED NOT DETECTED Final   Candida glabrata NOT DETECTED NOT DETECTED Final   Candida krusei NOT DETECTED NOT DETECTED Final   Candida parapsilosis NOT DETECTED NOT DETECTED Final   Candida tropicalis NOT DETECTED NOT DETECTED Final  MRSA PCR Screening     Status: None   Collection Time: 03/29/16  5:54 PM  Result Value Ref Range Status   MRSA by PCR NEGATIVE NEGATIVE Final    Comment:        The GeneXpert MRSA Assay (FDA approved for NASAL specimens only), is one component of a comprehensive MRSA colonization surveillance program. It is not intended to diagnose MRSA infection nor to guide or monitor treatment for MRSA infections.   Urine culture     Status: None   Collection Time: 03/29/16  8:41 PM  Result Value Ref Range Status   Specimen Description URINE, CLEAN CATCH  Final   Special Requests NONE  Final   Culture NO GROWTH  Final   Report Status 03/31/2016  FINAL  Final  Culture, blood (Routine X 2) w Reflex to ID Panel  Status: None (Preliminary result)   Collection Time: 03/31/16 12:21 PM  Result Value Ref Range Status   Specimen Description BLOOD LEFT HAND  Final   Special Requests IN PEDIATRIC BOTTLE 3CC  Final   Culture NO GROWTH 4 DAYS  Final   Report Status PENDING  Incomplete  Culture, blood (Routine X 2) w Reflex to ID Panel     Status: None (Preliminary result)   Collection Time: 03/31/16 12:29 PM  Result Value Ref Range Status   Specimen Description BLOOD RIGHT HAND  Final   Special Requests IN PEDIATRIC BOTTLE 0.5CC  Final   Culture NO GROWTH 4 DAYS  Final   Report Status PENDING  Incomplete   Radiology Studies: Dg Chest 2 View  04/05/2016  CLINICAL DATA:  Pleural effusion; intermittent SOB today; non-smoker EXAM: CHEST  2 VIEW COMPARISON:  04/03/2016 FINDINGS: Stable cardiac enlargement with replacement cardiac valve. Extensive opacity over the right lung occupying lower half of the right thorax consisting of pleural effusion and consolidation. IMPRESSION: Stable right effusion and consolidation Electronically Signed   By: Skipper Cliche M.D.   On: 04/05/2016 08:31   Scheduled Meds: . amiodarone  400 mg Oral BID  . aspirin EC  81 mg Oral q morning - 10a  . atorvastatin  40 mg Oral QHS  . metoprolol tartrate  75 mg Oral BID  . off the beat book   Does not apply Once  . pantoprazole  40 mg Oral Q1200  . vancomycin  1,000 mg Intravenous Q12H  . warfarin  5 mg Oral ONCE-1800  . Warfarin - Pharmacist Dosing Inpatient   Does not apply q1800   Continuous Infusions: . heparin 2,400 Units/hr (04/05/16 1114)    Time spent: 35 minutes, more than 50% bedside in counseling and discussion   Marzetta Board, MD, PhD [facility] Pager [phone]  If 7PM-7AM, please contact night-coverage [email or web address] Password [email or web address] 04/05/2016, 1:05 PM

## 2016-04-05 NOTE — Progress Notes (Signed)
Report completed to [place], RN on unit 3 [place], pt will transfer to room 3E19.  Pt already aware of new room number and he will let his family know his new room number.

## 2016-04-06 ENCOUNTER — Other Ambulatory Visit: Payer: Self-pay | Admitting: Internal Medicine

## 2016-04-06 DIAGNOSIS — I3139 Other pericardial effusion (noninflammatory): Secondary | ICD-10-CM | POA: Insufficient documentation

## 2016-04-06 DIAGNOSIS — I313 Pericardial effusion (noninflammatory): Secondary | ICD-10-CM | POA: Insufficient documentation

## 2016-04-06 DIAGNOSIS — R7881 Bacteremia: Secondary | ICD-10-CM

## 2016-04-06 DIAGNOSIS — J189 Pneumonia, unspecified organism: Secondary | ICD-10-CM

## 2016-04-06 LAB — CBC
HEMATOCRIT: 36.4 % — AB (ref 39.0–52.0)
HEMOGLOBIN: 12.2 g/dL — AB (ref 13.0–17.0)
MCH: 29.9 pg (ref 26.0–34.0)
MCHC: 33.5 g/dL (ref 30.0–36.0)
MCV: 89.2 fL (ref 78.0–100.0)
PLATELETS: 389 10*3/uL (ref 150–400)
RBC: 4.08 MIL/uL — AB (ref 4.22–5.81)
RDW: 13 % (ref 11.5–15.5)
WBC: 13.6 10*3/uL — AB (ref 4.0–10.5)

## 2016-04-06 LAB — BASIC METABOLIC PANEL
ANION GAP: 15 (ref 5–15)
BUN: 26 mg/dL — ABNORMAL HIGH (ref 6–20)
CALCIUM: 8.7 mg/dL — AB (ref 8.9–10.3)
CO2: 28 mmol/L (ref 22–32)
Chloride: 94 mmol/L — ABNORMAL LOW (ref 101–111)
Creatinine, Ser: 1.89 mg/dL — ABNORMAL HIGH (ref 0.61–1.24)
GFR, EST AFRICAN AMERICAN: 44 mL/min — AB (ref >60)
GFR, EST NON AFRICAN AMERICAN: 38 mL/min — AB (ref >60)
GLUCOSE: 99 mg/dL (ref 65–99)
POTASSIUM: 4.6 mmol/L (ref 3.5–5.1)
SODIUM: 137 mmol/L (ref 135–145)

## 2016-04-06 LAB — PROTIME-INR
INR: 2.13 — ABNORMAL HIGH (ref 0.00–1.49)
Prothrombin Time: 23.7 seconds — ABNORMAL HIGH (ref 11.6–15.2)

## 2016-04-06 LAB — HEPARIN LEVEL (UNFRACTIONATED): HEPARIN UNFRACTIONATED: 0.23 [IU]/mL — AB (ref 0.30–0.70)

## 2016-04-06 MED ORDER — PANTOPRAZOLE SODIUM 40 MG PO TBEC: 40.0000 mg | DELAYED_RELEASE_TABLET | Freq: Every day | ORAL | Status: DC

## 2016-04-06 MED ORDER — WARFARIN SODIUM 5 MG PO TABS
5.0000 mg | ORAL_TABLET | Freq: Once | ORAL | Status: DC
Start: 2016-04-06 — End: 2016-04-06

## 2016-04-06 MED ORDER — FUROSEMIDE 40 MG PO TABS: 80.0000 mg | ORAL_TABLET | Freq: Every day | ORAL | Status: DC

## 2016-04-06 MED ORDER — AMIODARONE HCL 200 MG PO TABS
200.0000 mg | ORAL_TABLET | Freq: Two times a day (BID) | ORAL | Status: DC
  Administered 2016-04-06: 200 mg via ORAL

## 2016-04-06 MED ORDER — LINEZOLID 600 MG PO TABS: 600.0000 mg | ORAL_TABLET | Freq: Two times a day (BID) | ORAL | Status: DC

## 2016-04-06 MED ORDER — AMIODARONE HCL 200 MG PO TABS: 200.0000 mg | ORAL_TABLET | Freq: Two times a day (BID) | ORAL | Status: DC

## 2016-04-06 MED FILL — LINEZOLID 600 MG TABLET: 600 | 5 days supply | Qty: 10 | Fill #0

## 2016-04-06 NOTE — Discharge Summary (Signed)
Physician Discharge Summary  Henry Dougherty MRN: [id number] DOB/AGE: 22-Nov-1960 55 y.o.  PCP: Pcp Not In System   Admit date: 03/29/2016 Discharge date: 04/06/2016  Discharge Diagnoses:     Principal Problem:   HCAP (healthcare-associated pneumonia) Active Problems:   Atrial flutter with rapid ventricular response ([place])   Long term (current) use of anticoagulants [Z79.01]   S/P MVR (mitral valve repair)   Hypotension (arterial)   Dyspnea   S/P Maze operation for atrial fibrillation   Gram-positive cocci bacteremia - MRSA   Acute respiratory failure (HCC)   Acute on chronic combined systolic and diastolic heart failure (HCC)   Valvular cardiomyopathy (HCC)   SOB (shortness of breath)   Acute systolic heart failure (HCC)   Pleural effusion   Pericardial effusion    Follow-up recommendations Follow-up with PCP in 3-5 days , including all  additional recommended appointments as below Follow-up CBC, CMP in 3-5 days       Current Discharge Medication List    START taking these medications   Details  furosemide (LASIX) 40 MG tablet Take 2 tablets (80 mg total) by mouth daily. Qty: 120 tablet, Refills: 1    linezolid (ZYVOX) 600 MG tablet Take 1 tablet (600 mg total) by mouth 2 (two) times daily. Qty: 14 tablet, Refills: 0    pantoprazole (PROTONIX) 40 MG tablet Take 1 tablet (40 mg total) by mouth daily at 12 noon. Qty: 30 tablet, Refills: 1      CONTINUE these medications which have CHANGED   Details  amiodarone (PACERONE) 200 MG tablet Take 1 tablet (200 mg total) by mouth 2 (two) times daily. Qty: 60 tablet, Refills: 1      CONTINUE these medications which have NOT CHANGED   Details  aspirin EC 81 MG tablet Take 81 mg by mouth every morning.    atorvastatin (LIPITOR) 40 MG tablet Take 40 mg by mouth at bedtime.    cetirizine (ZYRTEC) 5 MG tablet Take 5 mg by mouth every morning.    docusate sodium (COLACE) 100 MG capsule Take 100 mg by mouth 2 (two) times  daily.    famotidine (PEPCID) 20 MG tablet Take 20 mg by mouth 2 (two) times daily.    fluticasone (FLONASE) 50 MCG/ACT nasal spray Place 2 sprays into both nostrils daily.    metoprolol tartrate (LOPRESSOR) 25 MG tablet Take 75 mg by mouth 2 (two) times daily.    oxyCODONE-acetaminophen (PERCOCET/ROXICET) 5-325 MG tablet Take 2 tablets by mouth every 6 (six) hours as needed for moderate pain.    potassium chloride SA (K-DUR,KLOR-CON) 20 MEQ tablet Take 20 mEq by mouth every morning.    warfarin (COUMADIN) 5 MG tablet Take 5 mg by mouth every afternoon on Sun/Tues/Thurs/Sat and 10 mg on Mon/Wed/Fri      STOP taking these medications     amLODipine (NORVASC) 10 MG tablet      bumetanide (BUMEX) 1 MG tablet      ibuprofen (ADVIL,MOTRIN) 600 MG tablet      losartan (COZAAR) 50 MG tablet          Discharge Condition:  Stable   Discharge Instructions Get Medicines reviewed and adjusted: Please take all your medications with you for your next visit with your Primary MD  Please request your Primary MD to go over all hospital tests and procedure/radiological results at the follow up, please ask your Primary MD to get all Hospital records sent to his/her office.  If you experience worsening of your  admission symptoms, develop shortness of breath, life threatening emergency, suicidal or homicidal thoughts you must seek medical attention immediately by calling 911 or calling your MD immediately if symptoms less severe.  You must read complete instructions/literature along with all the possible adverse reactions/side effects for all the Medicines you take and that have been prescribed to you. Take any new Medicines after you have completely understood and accpet all the possible adverse reactions/side effects.   Do not drive when taking Pain medications.   Do not take more than prescribed Pain, Sleep and Anxiety Medications  Special Instructions: If you have smoked or chewed  Tobacco in the last 2 yrs please stop smoking, stop any regular Alcohol and or any Recreational drug use.  Wear Seat belts while driving.  Please note  You were cared for by a hospitalist during your hospital stay. Once you are discharged, your primary care physician will handle any further medical issues. Please note that NO REFILLS for any discharge medications will be authorized once you are discharged, as it is imperative that you return to your primary care physician (or establish a relationship with a primary care physician if you do not have one) for your aftercare needs so that they can reassess your need for medications and monitor your lab values.     Allergies  Allergen Reactions  . Amiodarone     Blisters and whelps to IV FORM.      Disposition: Final discharge disposition not confirmed   Consults:  Cardiology     Significant Diagnostic Studies:  Dg Chest 2 View  04/05/2016  CLINICAL DATA:  Pleural effusion; intermittent SOB today; non-smoker EXAM: CHEST  2 VIEW COMPARISON:  04/03/2016 FINDINGS: Stable cardiac enlargement with replacement cardiac valve. Extensive opacity over the right lung occupying lower half of the right thorax consisting of pleural effusion and consolidation. IMPRESSION: Stable right effusion and consolidation Electronically Signed   By: Skipper Cliche M.D.   On: 04/05/2016 08:31   Dg Chest 2 View  04/03/2016  CLINICAL DATA:  Pleural effusion.  Cough. EXAM: CHEST  2 VIEW COMPARISON:  Apr 01, 2016 FINDINGS: There is a persistent right pleural effusion and underlying opacity, similar to slightly improved in the interval. IMPRESSION: Stable to slightly improved moderate size right pleural effusion and underlying opacity. Electronically Signed   By: Dorise Bullion III M.D   On: 04/03/2016 11:14   Dg Chest Port 1 View  04/01/2016  CLINICAL DATA:  Respiratory failure. EXAM: PORTABLE CHEST 1 VIEW COMPARISON:  03/31/2016. FINDINGS: Stable cardiomegaly.  Persistent right lower lobe atelectasis and/or infiltrate with right pleural effusion. No interim change. No pneumothorax. IMPRESSION: 1. Persistent right lower lobe atelectasis and/or infiltrate with persistent right pleural effusion. 2.  Persistent unchanged cardiomegaly . Electronically Signed   By: Marcello Moores  Register   On: 04/01/2016 07:27   Dg Chest Port 1 View  03/31/2016  CLINICAL DATA:  Respiratory failure. EXAM: PORTABLE CHEST 1 VIEW COMPARISON:  03/29/2016. FINDINGS: Prior cardiac valve replacement. Atrial appendage clip in stable position. Stable cardiomegaly with normal pulmonary vascularity. Persistent right lower lobe atelectasis and or infiltrate. Persistent right pleural effusion. IMPRESSION: 1. Stable cardiomegaly.  Prior cardiac valve replacement. 2. Persistent right lower lobe infiltrate and right pleural effusion without significant interim change . Electronically Signed   By: Marcello Moores  Register   On: 03/31/2016 07:26   Dg Chest Portable 1 View  03/29/2016  CLINICAL DATA:  Shortness of breath since yesterday. Patient had recent mitro valve repair in  [place]. EXAM: PORTABLE CHEST 1 VIEW COMPARISON:  None. FINDINGS: The mediastinal contour is normal. Heart size enlarged. Mitral valvular replacement ring is noted. There is consolidation of the right mid and lung base. There is a small right pleural effusion. The left lung is clear. No acute abnormalities identified in the visualized bones. IMPRESSION: Consolidation of the right mid and lung base with right pleural effusion for pneumonia. Electronically Signed   By: Abelardo Diesel M.D.   On: 03/29/2016 13:56     TEE LV EF: 40% - 45%  ------------------------------------------------------------------- Indications: Bacteremia 790.7.  ------------------------------------------------------------------- History: PMH: Dyspnea. Atrial flutter.  ------------------------------------------------------------------- Study  Conclusions  - Left ventricle: Systolic function was mildly to moderately  reduced. The estimated ejection fraction was in the range of 40%  to 45%. Diffuse hypokinesis. - Aortic valve: No evidence of vegetation. There was mild  regurgitation. - Mitral valve: Prior procedures included surgical repair. An  annular ring prosthesis was present and functioning normally. No  evidence of vegetation. There was trivial to mild regurgitation. - Right atrium: No evidence of thrombus in the atrial cavity or  appendage. - Atrial septum: No defect or patent foramen ovale was identified  (had prior repair). - Tricuspid valve: No evidence of vegetation. No evidence of  vegetation. There was moderate regurgitation. - Pulmonic valve: No evidence of vegetation. - Pericardium, extracardiac: A moderate pericardial effusion was  identified posterior to the heart and along the left ventricular  free wall. The fluid exhibited a fibrinous appearance.There was  no evidence of hemodynamic compromise.     Filed Weights   04/05/16 0500 04/05/16 1411 04/06/16 0405  Weight: 141.794 kg (312 lb 9.6 oz) 142.475 kg (314 lb 1.6 oz) 142.2 kg (313 lb 7.9 oz)     Microbiology: Recent Results (from the past 240 hour(s))  Blood Culture (routine x 2)     Status: None   Collection Time: 03/29/16  2:15 PM  Result Value Ref Range Status   Specimen Description BLOOD LEFT ANTECUBITAL  Final   Special Requests BOTTLES DRAWN AEROBIC AND ANAEROBIC 5CC  Final   Culture NO GROWTH 5 DAYS  Final   Report Status 04/03/2016 FINAL  Final  Blood Culture (routine x 2)     Status: Abnormal   Collection Time: 03/29/16  2:19 PM  Result Value Ref Range Status   Specimen Description BLOOD RIGHT ANTECUBITAL  Final   Special Requests BOTTLES DRAWN AEROBIC AND ANAEROBIC 5CC  Final   Culture  Setup Time   Final    GRAM POSITIVE COCCI IN CLUSTERS IN BOTH AEROBIC AND ANAEROBIC BOTTLES Organism ID to follow CRITICAL RESULT  CALLED TO, READ BACK BY AND VERIFIED WITH: C. STEWART, PHARM D AT 1409 ON [id number] BY S. YARBROUGH    Culture (A)  Final    STAPHYLOCOCCUS SPECIES (COAGULASE NEGATIVE) THE SIGNIFICANCE OF ISOLATING THIS ORGANISM FROM A SINGLE SET OF BLOOD CULTURES WHEN MULTIPLE SETS ARE DRAWN IS UNCERTAIN. PLEASE NOTIFY THE MICROBIOLOGY DEPARTMENT WITHIN ONE WEEK IF SPECIATION AND SENSITIVITIES ARE REQUIRED.    Report Status 04/01/2016 FINAL  Final  Blood Culture ID Panel (Reflexed)     Status: Abnormal   Collection Time: 03/29/16  2:19 PM  Result Value Ref Range Status   Enterococcus species NOT DETECTED NOT DETECTED Final   Vancomycin resistance NOT DETECTED NOT DETECTED Final   Listeria monocytogenes NOT DETECTED NOT DETECTED Final   Staphylococcus species DETECTED (A) NOT DETECTED Final    Comment: CRITICAL RESULT CALLED TO, READ BACK  BY AND VERIFIED WITH: C. STEWART,PHARM D AT 1409 ON [id number] BY S. YARBROUGH    Staphylococcus aureus NOT DETECTED NOT DETECTED Final   Methicillin resistance DETECTED (A) NOT DETECTED Final    Comment: CRITICAL RESULT CALLED TO, READ BACK BY AND VERIFIED WITH: C STEWART, PHARM D AT 1409 ON [id number] BY S. YARBROUGH    Streptococcus species NOT DETECTED NOT DETECTED Final   Streptococcus agalactiae NOT DETECTED NOT DETECTED Final   Streptococcus pneumoniae NOT DETECTED NOT DETECTED Final   Streptococcus pyogenes NOT DETECTED NOT DETECTED Final   Acinetobacter baumannii NOT DETECTED NOT DETECTED Final   Enterobacteriaceae species NOT DETECTED NOT DETECTED Final   Enterobacter cloacae complex NOT DETECTED NOT DETECTED Final   Escherichia coli NOT DETECTED NOT DETECTED Final   Klebsiella oxytoca NOT DETECTED NOT DETECTED Final   Klebsiella pneumoniae NOT DETECTED NOT DETECTED Final   Proteus species NOT DETECTED NOT DETECTED Final   Serratia marcescens NOT DETECTED NOT DETECTED Final   Carbapenem resistance NOT DETECTED NOT DETECTED Final   Haemophilus influenzae NOT  DETECTED NOT DETECTED Final   Neisseria meningitidis NOT DETECTED NOT DETECTED Final   Pseudomonas aeruginosa NOT DETECTED NOT DETECTED Final   Candida albicans NOT DETECTED NOT DETECTED Final   Candida glabrata NOT DETECTED NOT DETECTED Final   Candida krusei NOT DETECTED NOT DETECTED Final   Candida parapsilosis NOT DETECTED NOT DETECTED Final   Candida tropicalis NOT DETECTED NOT DETECTED Final  MRSA PCR Screening     Status: None   Collection Time: 03/29/16  5:54 PM  Result Value Ref Range Status   MRSA by PCR NEGATIVE NEGATIVE Final    Comment:        The GeneXpert MRSA Assay (FDA approved for NASAL specimens only), is one component of a comprehensive MRSA colonization surveillance program. It is not intended to diagnose MRSA infection nor to guide or monitor treatment for MRSA infections.   Urine culture     Status: None   Collection Time: 03/29/16  8:41 PM  Result Value Ref Range Status   Specimen Description URINE, CLEAN CATCH  Final   Special Requests NONE  Final   Culture NO GROWTH  Final   Report Status 03/31/2016 FINAL  Final  Culture, blood (Routine X 2) w Reflex to ID Panel     Status: None   Collection Time: 03/31/16 12:21 PM  Result Value Ref Range Status   Specimen Description BLOOD LEFT HAND  Final   Special Requests IN PEDIATRIC BOTTLE 3CC  Final   Culture NO GROWTH 5 DAYS  Final   Report Status 04/05/2016 FINAL  Final  Culture, blood (Routine X 2) w Reflex to ID Panel     Status: None   Collection Time: 03/31/16 12:29 PM  Result Value Ref Range Status   Specimen Description BLOOD RIGHT HAND  Final   Special Requests IN PEDIATRIC BOTTLE 0.5CC  Final   Culture NO GROWTH 5 DAYS  Final   Report Status 04/05/2016 FINAL  Final       Blood Culture    Component Value Date/Time   SDES BLOOD RIGHT HAND 03/31/2016 1229   SPECREQUEST IN PEDIATRIC BOTTLE 0.5CC 03/31/2016 1229   CULT NO GROWTH 5 DAYS 03/31/2016 1229   REPTSTATUS 04/05/2016 FINAL 03/31/2016  1229      Labs: Results for orders placed or performed during the hospital encounter of 03/29/16 (from the past 48 hour(s))  CBC     Status: Abnormal   Collection Time:  04/05/16  2:37 AM  Result Value Ref Range   WBC 13.5 (H) 4.0 - 10.5 K/uL   RBC 3.92 (L) 4.22 - 5.81 MIL/uL   Hemoglobin 11.7 (L) 13.0 - 17.0 g/dL   HCT 35.7 (L) 39.0 - 52.0 %   MCV 91.1 78.0 - 100.0 fL   MCH 29.8 26.0 - 34.0 pg   MCHC 32.8 30.0 - 36.0 g/dL   RDW 12.8 11.5 - 15.5 %   Platelets 463 (H) 150 - 400 K/uL  Heparin level (unfractionated)     Status: None   Collection Time: 04/05/16  2:37 AM  Result Value Ref Range   Heparin Unfractionated 0.39 0.30 - 0.70 IU/mL    Comment:        IF HEPARIN RESULTS ARE BELOW EXPECTED VALUES, AND PATIENT DOSAGE HAS BEEN CONFIRMED, SUGGEST FOLLOW UP TESTING OF ANTITHROMBIN III LEVELS.   Basic metabolic panel     Status: Abnormal   Collection Time: 04/05/16  2:37 AM  Result Value Ref Range   Sodium 139 135 - 145 mmol/L   Potassium 3.4 (L) 3.5 - 5.1 mmol/L   Chloride 94 (L) 101 - 111 mmol/L   CO2 35 (H) 22 - 32 mmol/L   Glucose, Bld 134 (H) 65 - 99 mg/dL   BUN 21 (H) 6 - 20 mg/dL   Creatinine, Ser 2.20 (H) 0.61 - 1.24 mg/dL   Calcium 8.9 8.9 - 10.3 mg/dL   GFR calc non Af Amer 32 (L) >60 mL/min   GFR calc Af Amer 37 (L) >60 mL/min    Comment: (NOTE) The eGFR has been calculated using the CKD EPI equation. This calculation has not been validated in all clinical situations. eGFR's persistently <60 mL/min signify possible Chronic Kidney Disease.    Anion gap 10 5 - 15  Protime-INR     Status: Abnormal   Collection Time: 04/05/16  2:37 AM  Result Value Ref Range   Prothrombin Time 21.9 (H) 11.6 - 15.2 seconds   INR 1.92 (H) 0.00 - 1.49  Protime-INR     Status: Abnormal   Collection Time: 04/06/16  5:10 AM  Result Value Ref Range   Prothrombin Time 23.7 (H) 11.6 - 15.2 seconds   INR 2.13 (H) 0.00 - 1.49  CBC     Status: Abnormal   Collection Time: 04/06/16   5:10 AM  Result Value Ref Range   WBC 13.6 (H) 4.0 - 10.5 K/uL   RBC 4.08 (L) 4.22 - 5.81 MIL/uL   Hemoglobin 12.2 (L) 13.0 - 17.0 g/dL   HCT 36.4 (L) 39.0 - 52.0 %   MCV 89.2 78.0 - 100.0 fL   MCH 29.9 26.0 - 34.0 pg   MCHC 33.5 30.0 - 36.0 g/dL   RDW 13.0 11.5 - 15.5 %   Platelets 389 150 - 400 K/uL  Basic metabolic panel     Status: Abnormal   Collection Time: 04/06/16  5:10 AM  Result Value Ref Range   Sodium 137 135 - 145 mmol/L   Potassium 4.6 3.5 - 5.1 mmol/L   Chloride 94 (L) 101 - 111 mmol/L   CO2 28 22 - 32 mmol/L   Glucose, Bld 99 65 - 99 mg/dL   BUN 26 (H) 6 - 20 mg/dL   Creatinine, Ser 1.89 (H) 0.61 - 1.24 mg/dL   Calcium 8.7 (L) 8.9 - 10.3 mg/dL   GFR calc non Af Amer 38 (L) >60 mL/min   GFR calc Af Amer 44 (L) >60 mL/min  Comment: (NOTE) The eGFR has been calculated using the CKD EPI equation. This calculation has not been validated in all clinical situations. eGFR's persistently <60 mL/min signify possible Chronic Kidney Disease.    Anion gap 15 5 - 15  Heparin level (unfractionated)     Status: Abnormal   Collection Time: 04/06/16  5:18 AM  Result Value Ref Range   Heparin Unfractionated 0.23 (L) 0.30 - 0.70 IU/mL    Comment:        IF HEPARIN RESULTS ARE BELOW EXPECTED VALUES, AND PATIENT DOSAGE HAS BEEN CONFIRMED, SUGGEST FOLLOW UP TESTING OF ANTITHROMBIN III LEVELS.      Lipid Panel  No results found for: CHOL, TRIG, HDL, CHOLHDL, VLDL, LDLCALC, LDLDIRECT   No results found for: HGBA1C   Lab Results  Component Value Date   CREATININE 1.89* 04/06/2016     HPI :  Henry Dougherty is a 55 y.o. male Recently had acute severe MR 2/2 ruptured MV cord, underwent MV repair in addition to MAZE to tx Afib and clipping of left atrial appendage & closure of PFO on May 4th, 2017. This was done in [place] where he works during the week. He was hospitlazed for roughly 2 wk, known to have effusion at time of discharge ,hoping to improve with  diuretics, as in the past. He returned [facility] on 5/13 with intent to also do cardiac rehab. He started to have worsening shortness of breath, increase cough especially with deep inhalation, no fevers or worsening nightsweats/chills. He was evaluated by Dr. Debara Pickett during anticoagulation clinic. At that visit on 5/16, the patient appeared quite ill, pale and diaphoretic and short of breath. Blood pressure was notably hypotensive. He did not have fever.he was sent to the ED for further evaluation. In the ED, he was afebrile, HR 120-130s, though hypotensive, LA 3.5, CXR showed large right effusion +/- consolidation, per my read. Labs showed WBC of 22, and plt 644, procalcitonin 0.5. Blood cx nshowed MRSE in 1 of 2 sets on admit. He was empirically started on vancomycin and piptazo for HCAP. TTE revealed large posterior pericardial effusion. On TEE, no vegetations noted to any of the valves. The pericardial effusion described as moderate size, posteiror to the hear and exhibited fibrinous appearance. Remains afebrile.  HOSPITAL COURSE:  Sepsis due to HCAP - On admission patient was hypothermic, tachycardic, hypotensive and with a chest x-ray with infiltrate and pleural effusion he was started on treatment for healthcare associated pneumonia given recent prolonged hospitalization with vancomycin and Zosyn  - discontinued Zosyn since unlike HCAP, completed vancomycin  Acute on Chronic HFrEF: EF of 40-45% and diffuse hypokinesis. weight went from about 330 to 313.  Creatinine improved from 2.2 to 1.89 (baseline 1.7 during admission) after transitioning from Lasix 80 mg IV TID to Lasix 80 mg po QD.  -Likely discharge on Lasix 80 mg po daily -ASA 81 mg daily -Atorvastatin 40 mg daily -Holding ACEI due to renal function, consider adding as outpatient -Follow up with Cardiology with Kerin Ransom on 5/31. -Will need to fax records to Cardiologist in [place] Dr. Soyla Dryer, MD at [phone] or  [phone].  AKI: Unclear baseline. Creatinine improved from 2.2 to 1.8 (baseline 1.7 during admission) after transitioning from Lasix 80 mg IV TID to Lasix 80 mg po QD.  -Repeat BMET in 5-7 days   Atypical Atrial Flutter w RVR s/p MAZE Procedure 4/17: Maintaining sinus rhythm, HR in the 70-80s. INR 2.1 this morning. -Telemetry -Decrease amiodarone to 200  mg BID -Continue  Metoprolol 75 mg BID -On Coumadin, follow up in coumadin clinic 5/31 (may need earlier follow up)  H/o Severe Mitral Valve Regurgitation s/p MV Repair: Done April 2017 in [place]. TEE on 5/19 showed annular ring prosthesis was present and functioning normally without evidence of vegetation. INR 2.1 this morning. -On Coumadin -Heparin gtt discontinued his INR therapeutic 2.13 today  Moderate Pericardial Effusion: TEE showed moderate fibrinous pericardial effusion posterior to the heart and along the left ventricular free wall without evidence of hemodynamic compromise.  -Repeat TTE as outpatient  Coag Negative Staph: Positive BCx in 1/2 with coag negative staph. TEE showed no evidence of vegetation concerning for endocarditis. ID recommends treating for bacteremia. Completed IV Vancomycin -Plans to start Linezolid BID po on 5/24 for 1 week -ID following  HTN: 130-150s/70s this morning.  -On metoprolol and amiodarone as above Holding Norvasc, Bumex, Cozaar      Discharge Exam:    Blood pressure 158/76, pulse 77, temperature 97.8 F (36.6 C), temperature source Oral, resp. rate 20, height 5' 10"  (1.778 m), weight 142.2 kg (313 lb 7.9 oz), SpO2 96 %.  General: Vital signs reviewed. Patient is obese, in no acute distress and cooperative with exam.  Cardiovascular: RRR, S1 normal, S2 normal, no murmurs, gallops, or rubs. Pulmonary/Chest: Decreased breath sounds in right lower lung field, no wheezing, rhonchi or rales. Abdominal: Soft, non-tender, non-distended, BS + Extremities: Trace lower extremity  edema bilaterally, pulses symmetric and intact bilaterally.    Follow-up Information    Follow up with PCP. Schedule an appointment as soon as possible for a visit in 3 days.   Why:  Hospital follow-up, need to check labs      Signed: [facility] 04/06/2016, 11:15 AM        Time spent >45 mins

## 2016-04-06 NOTE — Progress Notes (Signed)
Pt ambulated in the hallway Sp02 100% No distress noted.

## 2016-04-06 NOTE — Progress Notes (Signed)
Difficulties with high out of pocket cost from insurance with name brand linezolid. Was able to get generic for $153.60 for remaining 5 days of tx

## 2016-04-06 NOTE — Progress Notes (Signed)
ANTICOAGULATION CONSULT NOTE - Follow Up Consult  Pharmacy Consult for heparin/warfarin Indication: atrial fibrillation  Allergies  Allergen Reactions  . Amiodarone     Blisters and whelps to IV FORM.    Patient Measurements: Height: 5\' 10"  (177.8 cm) Weight: (!) 313 lb 7.9 oz (142.2 kg) IBW/kg (Calculated) : 73 Heparin dosing weight: 109 kg  Vital Signs: Temp: 97.8 F (36.6 C) (05/24 0405) Temp Source: Oral (05/24 0405) BP: 158/76 mmHg (05/24 0405) Pulse Rate: 77 (05/24 0405)  Labs:  Recent Labs  04/04/16 0311 04/04/16 [id number] 04/05/16 0237 04/06/16 0510 04/06/16 0518  HGB 11.1*  --  11.7* 12.2*  --   HCT 34.2*  --  35.7* 36.4*  --   PLT 489*  --  463* 389  --   LABPROT 19.6*  --  21.9* 23.7*  --   INR 1.66*  --  1.92* 2.13*  --   HEPARINUNFRC  --  0.42 0.39  --  0.23*  CREATININE 1.79*  --  2.20* 1.89*  --     Estimated Creatinine Clearance: 62.2 mL/min (by C-G formula based on Cr of 1.89).  Assessment: 55 y/o admitted with PNA. Pt on warfarin PTA for afib. Admit INR 3.58 and today is 1.92 and on heparin bridge back to warfarin. Heparin level remains therapeutic on 2400 units/hr.  Will need to watch INR trend closely with new amiodarone and acute systolic HF. Amiodarone added 5/17  PTA coumadin dose: 5 mg T/T/S/S and 10 mg M/W/F   INR therapeutic this AM -- to stop heparin  Has completed 7 days of Vancomycin -- awaiting transition to Zyvox  Goal of Therapy:  Heparin level 0.3-0.7 units/ml Monitor platelets by anticoagulation protocol: Yes   Plan:  DC heparin  Coumadin 5 mg po x 1 today Decrease home dose to 7.5 mg MWF, 5 mg other days at discharge  Follow up AM INR if not discharged  Thank you Anette Guarneri, PharmD [phone]  04/06/2016 9:55 AM

## 2016-04-06 NOTE — Progress Notes (Signed)
ANTICOAGULATION CONSULT NOTE - Follow Up Consult  Pharmacy Consult for heparin/warfarin Indication: atrial fibrillation  Allergies  Allergen Reactions  . Amiodarone     Blisters and whelps to IV FORM.    Patient Measurements: Height: 5\' 10"  (177.8 cm) Weight: (!) 313 lb 7.9 oz (142.2 kg) IBW/kg (Calculated) : 73 Heparin dosing weight: 109 kg  Vital Signs: Temp: 97.8 F (36.6 C) (05/24 0405) Temp Source: Oral (05/24 0405) BP: 158/76 mmHg (05/24 0405) Pulse Rate: 77 (05/24 0405)  Labs:  Recent Labs  04/04/16 0311 04/04/16 [id number] 04/05/16 0237 04/06/16 0518  HGB 11.1*  --  11.7*  --   HCT 34.2*  --  35.7*  --   PLT 489*  --  463*  --   LABPROT 19.6*  --  21.9*  --   INR 1.66*  --  1.92*  --   HEPARINUNFRC  --  0.42 0.39 0.23*  CREATININE 1.79*  --  2.20*  --     Estimated Creatinine Clearance: 53.4 mL/min (by C-G formula based on Cr of 2.2).  Assessment: 55 y/o admitted with PNA. Pt on warfarin PTA for afib. Admit INR 3.58 and today is 1.92 and on heparin bridge back to warfarin. Heparin level remains therapeutic on 2400 units/hr.  Will need to watch INR trend closely with new amiodarone and acute systolic HF. Amiodarone added 5/17  PTA coumadin dose: 5 mg T/T/S/S and 10 mg M/W/F   HL this morning is subtherapeutic at 0.23 on heparin 2400 units/hr. Nurse reports no issues with infusion or bleeding.  Goal of Therapy:  Heparin level 0.3-0.7 units/ml Monitor platelets by anticoagulation protocol: Yes   Plan:  Increase heparin to 2600 units/hr 6h HL Daily HL/INR/CBC Monitor for S&S of bleed  Andrey Cota. Diona Foley, PharmD, [facility] Clinical Pharmacist Pager [phone] 04/06/2016 7:06 AM

## 2016-04-06 NOTE — Progress Notes (Addendum)
HISTORY: Henry Dougherty is a 55 yo previously healthy male with PMHx of atrial fibrillation w RVR s/p MAZE procedure, PFO s/p closure, and Severe MV regurgitation from cord rupture s/p MV repair in April 2017 who was admitted on 5/17 with acute hypoxic respiratory failure 2/2 HCAP, R pleural effusion and atrial flutter with RVR. Patient is now in sinus rhythm. Blood cx were positive for coag negative staph in 1/2 bottles.  SUBJECTIVE:   Patient was seen and examined this morning. He denies any chest pain, shortness of breath, nausea, lightheadedness or palpitations.   OBJECTIVE:   Vitals:   Filed Vitals:   04/05/16 1733 04/05/16 2031 04/06/16 0000 04/06/16 0405  BP: 134/77 134/55 146/66 158/76  Pulse: 85 80 74 77  Temp: 98.7 F (37.1 C) 97.6 F (36.4 C) 97.9 F (36.6 C) 97.8 F (36.6 C)  TempSrc: Oral Oral Oral Oral  Resp:  20 20 20   Height:      Weight:    313 lb 7.9 oz (142.2 kg)  SpO2: 97% 96% 95% 96%   I&O's:    Intake/Output Summary (Last 24 hours) at 04/06/16 1101 Last data filed at 04/06/16 [id number]  Gross per 24 hour  Intake   1548 ml  Output   2505 ml  Net   -957 ml   TELEMETRY: Sinus rhythm, HR 80s, PVCs.  PHYSICAL EXAM General: Vital signs reviewed.  Patient is obese, in no acute distress and cooperative with exam.  Cardiovascular: RRR, S1 normal, S2 normal, no murmurs, gallops, or rubs. Pulmonary/Chest: Decreased breath sounds in right lower lung field, no wheezing, rhonchi or rales. Abdominal: Soft, non-tender, non-distended, BS + Extremities: Trace lower extremity edema bilaterally, pulses symmetric and intact bilaterally.  LABS: Basic Metabolic Panel:  Recent Labs  04/05/16 0237 04/06/16 0510  NA 139 137  K 3.4* 4.6  CL 94* 94*  CO2 35* 28  GLUCOSE 134* 99  BUN 21* 26*  CREATININE 2.20* 1.89*  CALCIUM 8.9 8.7*   CBC:  Recent Labs  04/05/16 0237 04/06/16 0510  WBC 13.5* 13.6*  HGB 11.7* 12.2*  HCT 35.7* 36.4*  MCV 91.1 89.2  PLT 463* 389    Coag Panel:   Lab Results  Component Value Date   INR 2.13* 04/06/2016   INR 1.92* 04/05/2016   INR 1.66* 04/04/2016    RADIOLOGY: Dg Chest 2 View  04/05/2016  CLINICAL DATA:  Pleural effusion; intermittent SOB today; non-smoker EXAM: CHEST  2 VIEW COMPARISON:  04/03/2016 FINDINGS: Stable cardiac enlargement with replacement cardiac valve. Extensive opacity over the right lung occupying lower half of the right thorax consisting of pleural effusion and consolidation. IMPRESSION: Stable right effusion and consolidation Electronically Signed   By: Skipper Cliche M.D.   On: 04/05/2016 08:31   Dg Chest 2 View  04/03/2016  CLINICAL DATA:  Pleural effusion.  Cough. EXAM: CHEST  2 VIEW COMPARISON:  Apr 01, 2016 FINDINGS: There is a persistent right pleural effusion and underlying opacity, similar to slightly improved in the interval. IMPRESSION: Stable to slightly improved moderate size right pleural effusion and underlying opacity. Electronically Signed   By: Dorise Bullion III M.D   On: 04/03/2016 11:14   Dg Chest Port 1 View  04/01/2016  CLINICAL DATA:  Respiratory failure. EXAM: PORTABLE CHEST 1 VIEW COMPARISON:  03/31/2016. FINDINGS: Stable cardiomegaly. Persistent right lower lobe atelectasis and/or infiltrate with right pleural effusion. No interim change. No pneumothorax. IMPRESSION: 1. Persistent right lower lobe atelectasis and/or infiltrate with persistent right  pleural effusion. 2.  Persistent unchanged cardiomegaly . Electronically Signed   By: Marcello Moores  Register   On: 04/01/2016 07:27   Dg Chest Port 1 View  03/31/2016  CLINICAL DATA:  Respiratory failure. EXAM: PORTABLE CHEST 1 VIEW COMPARISON:  03/29/2016. FINDINGS: Prior cardiac valve replacement. Atrial appendage clip in stable position. Stable cardiomegaly with normal pulmonary vascularity. Persistent right lower lobe atelectasis and or infiltrate. Persistent right pleural effusion. IMPRESSION: 1. Stable cardiomegaly.  Prior cardiac  valve replacement. 2. Persistent right lower lobe infiltrate and right pleural effusion without significant interim change . Electronically Signed   By: Marcello Moores  Register   On: 03/31/2016 07:26   Dg Chest Portable 1 View  03/29/2016  CLINICAL DATA:  Shortness of breath since yesterday. Patient had recent mitro valve repair in [place]. EXAM: PORTABLE CHEST 1 VIEW COMPARISON:  None. FINDINGS: The mediastinal contour is normal. Heart size enlarged. Mitral valvular replacement ring is noted. There is consolidation of the right mid and lung base. There is a small right pleural effusion. The left lung is clear. No acute abnormalities identified in the visualized bones. IMPRESSION: Consolidation of the right mid and lung base with right pleural effusion for pneumonia. Electronically Signed   By: Abelardo Diesel M.D.   On: 03/29/2016 13:56   TTE 03/30/16: Study Conclusions  - Left ventricle: The cavity size was normal. There was moderate  concentric hypertrophy. Systolic function was moderately to  severely reduced. The estimated ejection fraction was in the  range of 30% to 35%. Diffuse hypokinesis. - Mitral valve: Prior procedures included surgical repair. An  annular ring prosthesis was present and functioning normally.  Valve area by continuity equation (using LVOT flow): 1.41 cm^2. - Left atrium: The atrium was moderately dilated. - Right atrium: The atrium was mildly dilated. - Pericardium, extracardiac: A large pericardial effusion was  identified posterior to the heart 3.1cm and along the left  ventricular free wall. The fluid exhibited a fibrinous  appearance. There was no evidence of hemodynamic compromise (no  RV or RA collpase) however IVC is dilated.  TEE 04/01/16: Study Conclusions  - Left ventricle: Systolic function was mildly to moderately  reduced. The estimated ejection fraction was in the range of 40%  to 45%. Diffuse hypokinesis. - Aortic valve: No evidence of  vegetation. There was mild  regurgitation. - Mitral valve: Prior procedures included surgical repair. An  annular ring prosthesis was present and functioning normally. No  evidence of vegetation. There was trivial to mild regurgitation. - Right atrium: No evidence of thrombus in the atrial cavity or  appendage. - Atrial septum: No defect or patent foramen ovale was identified  (had prior repair). - Tricuspid valve: No evidence of vegetation. No evidence of  vegetation. There was moderate regurgitation. - Pulmonic valve: No evidence of vegetation. - Pericardium, extracardiac: A moderate pericardial effusion was  identified posterior to the heart and along the left ventricular  free wall. The fluid exhibited a fibrinous appearance.There was  no evidence of hemodynamic compromise.  ASSESSMENT/PLAN:   Henry Dougherty is a 55 yo previously healthy male with PMHx of atrial fibrillation w RVR s/p MAZE procedure, PFO s/p closure, and Severe MV regurgitation from cord rupture s/p MV repair in April 2017 who was admitted on 5/17 with acute hypoxic respiratory failure 2/2 HCAP, R pleural effusion and atrial flutter with RVR. Patient is now in sinus rhythm.   Acute on Chronic HFrEF: EF of 40-45% and diffuse hypokinesis. UOP -2,200 mL yesterday. Weight stable this  morning at 312>313 lbs. Creatinine improved from 2.2 to 1.8 (baseline 1.7 during admission) after transitioning from Lasix 80 mg IV TID to Lasix 80 mg po QD.  -Likely discharge on Lasix 80 mg po daily -ASA 81 mg daily -Atorvastatin 40 mg daily -Holding ACEI due to renal function, consider adding as outpatient -Follow up with Cardiology with Kerin Ransom on 5/31. -Will need to fax records to Cardiologist in [place] Dr. Soyla Dryer, MD at [phone] or [phone].  AKI: Unclear baseline. Creatinine improved from 2.2 to 1.8 (baseline 1.7 during admission) after transitioning from Lasix 80 mg IV TID to Lasix 80 mg po QD.  -Repeat BMET at  follow up   Atypical Atrial Flutter w RVR s/p MAZE Procedure 4/17: Maintaining sinus rhythm, HR in the 70-80s. INR 2.1 this morning.  -Telemetry -Decrease amiodarone to 200 mg BID -Metoprolol 75 mg BID -On Coumadin, follow up in coumadin clinic 5/31 (may need earlier follow up)  H/o Severe Mitral Valve Regurgitation s/p MV Repair: Done April 2017 in [place]. TEE on 5/19 showed annular ring prosthesis was present and functioning normally without evidence of vegetation. INR 2.1 this morning. -On Coumadin -Heparin gtt  Moderate Pericardial Effusion: TEE showed moderate fibrinous pericardial effusion posterior to the heart and along the left ventricular free wall without evidence of hemodynamic compromise.  -Repeat TTE as outpatient  Coag Negative Staph: Positive BCx in 1/2 with coag negative staph. TEE showed no evidence of vegetation concerning for endocarditis. ID recommends treating for bacteremia. -On Vancomycin -Plans to start Linezolid BID po on 5/24 for 6 days -ID following  HTN: 130-150s/70s this morning.  -On metoprolol and amiodarone as above -Restart lasix  Henry Malay, DO PGY-2 Internal Medicine Resident Pager # [phone] 04/06/2016 The patient has been seen in conjunction with Henry Dougherty, D.O. All aspects of care have been considered and discussed. The patient has been personally interviewed, examined, and all clinical data has been reviewed.   The patient is ready for discharge. I agree with furosemide 80 mg daily. He should have a basic metabolic panel within 7 days. He should wait daily and notify us if significant further weight loss or gain (greater than 5 pounds in either direction). The amiodarone will be tapered to 200mg  as an outpatient. Total amiodarone duration will be 3-6 months. Hopefully the Maze procedure will prevent recurrence once amiodarone was discontinued. He needs outpatient cardiology follow-up in 7-10 days.  Will need outpatient echocardiogram to  follow-up pericardial effusion.

## 2016-04-06 NOTE — Progress Notes (Signed)
Dr Pixie Casino, MD office called to schedule INR / CMP 3 - 5 days after discharge; Patient has an apt at the coumadin clinic 04/12/2016 at 9 am - they will do the lab work at that timeMindi Slicker RN,MHA,BSN [phone]

## 2016-04-13 ENCOUNTER — Ambulatory Visit (INDEPENDENT_AMBULATORY_CARE_PROVIDER_SITE_OTHER): Payer: BLUE CROSS/BLUE SHIELD | Admitting: Cardiology

## 2016-04-13 ENCOUNTER — Encounter: Payer: Self-pay | Admitting: Cardiology

## 2016-04-13 ENCOUNTER — Ambulatory Visit (INDEPENDENT_AMBULATORY_CARE_PROVIDER_SITE_OTHER): Payer: BLUE CROSS/BLUE SHIELD | Admitting: Pharmacist

## 2016-04-13 DIAGNOSIS — I4892 Unspecified atrial flutter: Secondary | ICD-10-CM | POA: Diagnosis not present

## 2016-04-13 DIAGNOSIS — R7881 Bacteremia: Secondary | ICD-10-CM | POA: Diagnosis not present

## 2016-04-13 DIAGNOSIS — Z9889 Other specified postprocedural states: Secondary | ICD-10-CM | POA: Diagnosis not present

## 2016-04-13 DIAGNOSIS — N289 Disorder of kidney and ureter, unspecified: Secondary | ICD-10-CM

## 2016-04-13 DIAGNOSIS — Z7901 Long term (current) use of anticoagulants: Secondary | ICD-10-CM

## 2016-04-13 DIAGNOSIS — I5043 Acute on chronic combined systolic (congestive) and diastolic (congestive) heart failure: Secondary | ICD-10-CM | POA: Diagnosis not present

## 2016-04-13 DIAGNOSIS — N189 Chronic kidney disease, unspecified: Secondary | ICD-10-CM | POA: Insufficient documentation

## 2016-04-13 LAB — POCT INR: INR: 5.4

## 2016-04-13 LAB — BASIC METABOLIC PANEL
BUN: 33 mg/dL — ABNORMAL HIGH (ref 7–25)
CO2: 30 mmol/L (ref 20–31)
Calcium: 9.6 mg/dL (ref 8.6–10.3)
Chloride: 97 mmol/L — ABNORMAL LOW (ref 98–110)
Creat: 2.19 mg/dL — ABNORMAL HIGH (ref 0.70–1.33)
Glucose, Bld: 163 mg/dL — ABNORMAL HIGH (ref 65–99)
Potassium: 5.1 mmol/L (ref 3.5–5.3)
Sodium: 139 mmol/L (ref 135–146)

## 2016-04-13 MED ORDER — AMIODARONE HCL 200 MG PO TABS: 200.0000 mg | ORAL_TABLET | Freq: Every day | ORAL | Status: DC

## 2016-04-13 NOTE — Assessment & Plan Note (Signed)
S/P MR repair ([place] April 2017) TEE done during recent adm

## 2016-04-13 NOTE — Progress Notes (Signed)
04/13/2016 Henry Dougherty   March 01, 1961  [id number]  Primary Physician Pcp Not In System Primary Cardiologist: Dr Marlou Porch (pt request)  HPI:  55 yo obese male with PMHx of atrial fibrillation w RVR s/p MAZE procedure, PFO s/p closure, and Severe MV regurgitation from cord rupture s/p MV repair in April 2017 in [place] who was admitted on 5/17 with acute hypoxic respiratory failure 2/2 HCAP, R pleural effusion and atrial flutter with RVR. He had been seen in the office 5/16 by Dr Debara Pickett. He lives in [place] and works in [place]. He did well initially post op but the developed increasing dyspnea. He was admitted and found to be in CHF, acute renal insufficiency, and rapid AF. He was diuresed and his Amiodarone was increased. He converted on his own to NSR. His adm wgt was 326, todays wgt 311. 1/2 BC was positive for staph and this was treated, he did have a TEE that did not show endocarditis. He is in the office today for follow up. He feels much better. He is maintaining NSR. He has finished his antibiotics. He will be seeing his cardiologist in [place] in a week.  Current Outpatient Prescriptions  Medication Sig Dispense Refill  . amiodarone (PACERONE) 200 MG tablet Take 1 tablet (200 mg total) by mouth daily. 90 tablet 3  . aspirin EC 81 MG tablet Take 81 mg by mouth every morning.    Marland Kitchen atorvastatin (LIPITOR) 40 MG tablet Take 40 mg by mouth at bedtime.    . famotidine (PEPCID) 20 MG tablet Take 20 mg by mouth 2 (two) times daily.    . fluticasone (FLONASE) 50 MCG/ACT nasal spray Place 2 sprays into both nostrils daily.    . furosemide (LASIX) 40 MG tablet Take 2 tablets (80 mg total) by mouth daily. 120 tablet 1  . metoprolol tartrate (LOPRESSOR) 25 MG tablet Take 75 mg by mouth 2 (two) times daily.    . pantoprazole (PROTONIX) 40 MG tablet Take 1 tablet (40 mg total) by mouth daily at 12 noon. 30 tablet 1  . potassium chloride SA (K-DUR,KLOR-CON) 20 MEQ tablet Take 20 mEq by mouth every morning.    .  warfarin (COUMADIN) 5 MG tablet Take 5 mg by mouth every afternoon on Sun/Tues/Thurs/Sat and 10 mg on Mon/Wed/Fri     No current facility-administered medications for this visit.    Allergies  Allergen Reactions  . Amiodarone     Blisters and whelps to IV FORM.    Social History   Social History  . Marital Status: Married    Spouse Name: N/A  . Number of Children: N/A  . Years of Education: N/A   Occupational History  . Not on file.   Social History Main Topics  . Smoking status: Never Smoker   . Smokeless tobacco: Not on file  . Alcohol Use: Yes  . Drug Use: No  . Sexual Activity: Not on file   Other Topics Concern  . Not on file   Social History Narrative     Review of Systems: General: negative for chills, fever, night sweats or weight changes.  Cardiovascular: negative for chest pain, dyspnea on exertion, edema, orthopnea, palpitations, paroxysmal nocturnal dyspnea or shortness of breath Dermatological: negative for rash Respiratory: negative for cough or wheezing Urologic: negative for hematuria Abdominal: negative for nausea, vomiting, diarrhea, bright red blood per rectum, melena, or hematemesis Neurologic: negative for visual changes, syncope, or dizziness All other systems reviewed and are otherwise negative except as  noted above.    Blood pressure 134/84, pulse 80, height 5\' 10"  (1.778 m), weight 311 lb 9.6 oz (141.341 kg).  General appearance: alert, cooperative, no distress and morbidly obese Neck: no carotid bruit and no JVD Lungs: decreased Rt base Heart: regular rate and rhythm Extremities: no edema Skin: Skin color, texture, turgor normal. No rashes or lesions Neurologic: Grossly normal  EKG NSR, QTc 455  ASSESSMENT AND PLAN:   Acute on chronic combined systolic and diastolic heart failure (HCC) Just discharged 5/24 after adm for CHF  S/P MVR (mitral valve repair) S/P MR repair ([place]) TEE done during recent adm  Gram-positive  cocci bacteremia - MRSA 1/2 BC + for staph  Atrial flutter with rapid ventricular response (HCC) S/P Maze with MVR- NSR on Amiodarone  Long term (current) use of anticoagulants [Z79.01] Coumadin Rx  Pericardial effusion Needs F/U TTE - will defer to his cardiologist in [place]  Morbid obesity ([place]) BMI 44.7- suspected sleep apnea by history   Acute on chronic renal insufficiency (HCC) DC SCr 1.89    PLAN  Check BMP before deciding on Lasix and K+ dosing. Decrease Amiodarone to 200 mg daily. F/U her in 3 months. Will defer f/u TTE to his cardiologist in [place].   Kerin Ransom PA-C 04/13/2016 9:48 AM

## 2016-04-13 NOTE — Assessment & Plan Note (Signed)
1/2 BC + for staph

## 2016-04-13 NOTE — Assessment & Plan Note (Signed)
Just discharged 5/24 after adm for CHF

## 2016-04-13 NOTE — Patient Instructions (Addendum)
Medication Instructions:  Your physician has recommended you make the following change in your medication:  1) DECREASE Amiodarone to 200 mg by mouth ONCE daily   Labwork: Your physician recommends that you return for lab work in: [place] The lab can be found on the FIRST FLOOR of out building in [address]    Testing/Procedures: none  Follow-Up: Your physician recommends that you schedule a follow-up appointment in: 3 months with Dr. Marlou Porch.   Any Other Special Instructions Will Be Listed Below (If Applicable).     If you need a refill on your cardiac medications before your next appointment, please call your pharmacy.

## 2016-04-13 NOTE — Assessment & Plan Note (Signed)
S/P Maze with MVR- NSR on Amiodarone

## 2016-04-13 NOTE — Assessment & Plan Note (Signed)
Coumadin Rx 

## 2016-04-13 NOTE — Assessment & Plan Note (Signed)
Needs F/U TTE - will defer to his cardiologist in [place]

## 2016-04-13 NOTE — Assessment & Plan Note (Signed)
BMI 44.7- suspected sleep apnea by history

## 2016-04-13 NOTE — Assessment & Plan Note (Signed)
DC SCr 1.89

## 2016-04-15 ENCOUNTER — Ambulatory Visit: Payer: Self-pay | Admitting: Cardiovascular Disease

## 2016-04-15 ENCOUNTER — Telehealth: Payer: Self-pay | Admitting: Cardiology

## 2016-04-15 DIAGNOSIS — Z79899 Other long term (current) drug therapy: Secondary | ICD-10-CM

## 2016-04-15 MED ORDER — POTASSIUM CHLORIDE CRYS ER 20 MEQ PO TBCR: 20.0000 meq | EXTENDED_RELEASE_TABLET | Freq: Every morning | ORAL | Status: DC

## 2016-04-15 MED ORDER — FAMOTIDINE 20 MG PO TABS: 20.0000 mg | ORAL_TABLET | Freq: Two times a day (BID) | ORAL | Status: DC

## 2016-04-15 NOTE — Telephone Encounter (Signed)
Returned call to patient. Pt waiting on instruction for what to do w/ K+ & lasix as relates to latest BMET result.  He also needs refills for potassium and pepcid. I sent these to pharmacy.  Advised same dosing for his lasix & potassium until result note from [facility].  Pt voiced understanding and thanks for assistance.  Sent to [facility] for review of labwork.

## 2016-04-15 NOTE — Telephone Encounter (Signed)
Pt says he is waiting to hear from his lab results,so he will know how to take his medicine. He was expecting a call yesterday,needs to know what to do about his medicine.

## 2016-04-18 NOTE — Telephone Encounter (Signed)
Recommendations given to patient. He will follow instruction.  BMP in 2 weeks - labs ordered, patient requested this sent to him at [email or web address] rather than letter-mailed.

## 2016-04-18 NOTE — Telephone Encounter (Signed)
Order emailed to patient.

## 2016-04-18 NOTE — Telephone Encounter (Signed)
Decrease Lasix to 40 mg daily- stop K+, one banana a day. BMP in two weeks.  Kerin Ransom PA-C 04/18/2016 8:04 AM

## 2016-07-14 ENCOUNTER — Ambulatory Visit: Payer: BLUE CROSS/BLUE SHIELD | Admitting: Cardiology

## 2016-10-20 ENCOUNTER — Ambulatory Visit: Payer: Self-pay | Admitting: Pharmacist

## 2019-07-08 ENCOUNTER — Telehealth: Payer: Self-pay | Admitting: *Deleted

## 2019-07-08 DIAGNOSIS — Z9889 Other specified postprocedural states: Secondary | ICD-10-CM

## 2019-07-08 DIAGNOSIS — R0602 Shortness of breath: Secondary | ICD-10-CM

## 2019-07-08 NOTE — Telephone Encounter (Signed)
Henry Pain, Henry Dougherty  [facility] [address] Scheduling; Shellia Cleverly, RN        Please get him in to see me. Referred by Dr. Shon Baton. Has seen me back in 2017.  Shortness of breath.  Let's get an ECHO ordered.  Had MV repair.   Thanks.   Candee Furbish, Henry Dougherty    Order placed for 2 D Echo. Pt is aware someone will call him to schedule this.  Pt has been seeing a Henry Dougherty at [facility] and wants a Henry Dougherty closer to home.   He will request his MR from [place].  He is aware once his Echo is scheduled I will call him to schedule his appt with Dr Marlou Porch.  He reports he was seen recently at [facility] and doesn't feel like this is a rush.

## 2019-11-11 ENCOUNTER — Telehealth: Payer: Self-pay | Admitting: Cardiology

## 2019-11-11 DIAGNOSIS — Z9889 Other specified postprocedural states: Secondary | ICD-10-CM

## 2019-11-11 DIAGNOSIS — Z8679 Personal history of other diseases of the circulatory system: Secondary | ICD-10-CM

## 2019-11-11 NOTE — Telephone Encounter (Signed)
Conversation: Appointment (Newest Message First) Shellia Cleverly, RN     07/08/19 3:22 PM Note   Jerline Pain, MD  P [facility] Scheduling; Shellia Cleverly, RN        Please get him in to see me. Referred by Dr. Shon Baton. Has seen me back in 2017.  Shortness of breath.  Let's get an ECHO ordered.  Had MV repair.   Thanks.   Candee Furbish, MD    Order placed for 2 D Echo. Pt is aware someone will call him to schedule this.  Pt has been seeing a MD at [facility] and wants a MD closer to home.   He will request his MR from [place].  He is aware once his Echo is scheduled I will call him to schedule his appt with Dr Marlou Porch.  He reports he was seen recently at [facility] and doesn't feel like this is a rush.

## 2019-11-11 NOTE — Telephone Encounter (Signed)
Order for echo placed and pt is aware that someone from our scheduling dept will be calling him to arrange this prior to his appt in Feb 2021, with Dr. Marlou Porch. The echo is for follow-up of mitral valve repair.  This was originally ordered back in 06/2019, by Dr. Marlou Porch, but due to the pandemic, pt was unable to come in.  [facility] to follow-up with the pt and arrange this appt.  Pt verbalized understanding and agrees with this plan.

## 2019-11-11 NOTE — Telephone Encounter (Signed)
RE: sched echo prior to visit with Dr. Marlou Porch in Feb 2021 Received: Today Message Contents  [name], LPN  Schedule for [id number] @ 8:30

## 2019-11-11 NOTE — Telephone Encounter (Signed)
New Message     Pt is wondering if he can have an Echo done before his appt    Please call

## 2019-12-20 ENCOUNTER — Other Ambulatory Visit: Payer: Self-pay

## 2019-12-20 ENCOUNTER — Ambulatory Visit (HOSPITAL_COMMUNITY): Payer: No Typology Code available for payment source | Attending: Cardiovascular Disease

## 2019-12-20 DIAGNOSIS — Z8679 Personal history of other diseases of the circulatory system: Secondary | ICD-10-CM | POA: Diagnosis not present

## 2019-12-20 DIAGNOSIS — Z9889 Other specified postprocedural states: Secondary | ICD-10-CM | POA: Diagnosis not present

## 2019-12-20 MED ORDER — PERFLUTREN LIPID MICROSPHERE
1.0000 mL | INTRAVENOUS | Status: AC | PRN
  Administered 2019-12-20: 09:00:00 2 mL via INTRAVENOUS

## 2019-12-23 ENCOUNTER — Encounter: Payer: Self-pay | Admitting: Cardiology

## 2019-12-23 ENCOUNTER — Ambulatory Visit (INDEPENDENT_AMBULATORY_CARE_PROVIDER_SITE_OTHER): Payer: No Typology Code available for payment source | Admitting: Cardiology

## 2019-12-23 ENCOUNTER — Other Ambulatory Visit: Payer: Self-pay

## 2019-12-23 VITALS — BP 122/66 | HR 82 | Ht 70.0 in | Wt 357.0 lb

## 2019-12-23 DIAGNOSIS — R0602 Shortness of breath: Secondary | ICD-10-CM

## 2019-12-23 DIAGNOSIS — Z8679 Personal history of other diseases of the circulatory system: Secondary | ICD-10-CM

## 2019-12-23 DIAGNOSIS — I428 Other cardiomyopathies: Secondary | ICD-10-CM

## 2019-12-23 DIAGNOSIS — Z9889 Other specified postprocedural states: Secondary | ICD-10-CM

## 2019-12-23 MED ORDER — METOPROLOL SUCCINATE ER 25 MG PO TB24: 25.0000 mg | ORAL_TABLET | Freq: Every day | ORAL | 3 refills | Status: DC

## 2019-12-23 NOTE — Progress Notes (Signed)
Cardiology Office Note:    Date:  12/23/2019   ID:  Henry Dougherty, DOB 11/28/1960, MRN [id number]  PCP:  Shon Baton, MD  Cardiologist:  Candee Furbish, MD  Electrophysiologist:  None   Referring MD: Shon Baton, MD     History of Present Illness:    Henry Dougherty is a 59 y.o. male patient of Dr. Keane Police here for evaluation of prior atrial fibrillation underwent Maze procedure as well as left atrial clipping and closure of PFO in [place] at the request of Dr. Virgina Jock.  In May 2017 he developed sudden onset shortness of breath went to emergency department in [facility] in [place] where he was found to be in atrial fibrillation and also had a flail segment of mitral valve.  On 03/17/2016 he underwent mitral valve repair and atrial Maze procedure by Dr. Manuella Ghazi.  Incidental finding of PFO closed at the time of surgery.  Approximately 12 days after the surgery however he was admitted for hypoxic respiratory failure and found to have hospital-acquired pneumonia.  Right pleural effusion was present and he was in rapid atrial flutter.  He had acute renal failure.  Diuresed and was reloaded on amiodarone at that time.  TEE was performed secondary to single blood culture which was positive for MRSA.  Unremarkable.  Had been on torsemide for volume overload.  Had had some fatigue so his beta-blocker was adjusted.  Dyspnea on exertion really had not changed despite torsemide and change in metoprolol.  Up hills, stairs make him breathe heavier.  Much of this history is obtained from outside cardiologist note at [facility] as well as Dr. Keane Police note.  Both outside office notes were reviewed.  Non-smoker.  Spoke to his wife on phone as well during this visit.  Still has shortness of breath when going up stairs.  [place].  Okay on flat.  Been traveling now to [facility] more.  Does not eat as well on the road.  Past Medical History:  Diagnosis Date  . Allergic rhinitis    . CHF (congestive heart failure) (HCC)    EF 45%/ VALVULAR CM  . DOE (dyspnea on exertion)   . Dyspepsia   . Environmental allergies   . Hypertension   . OA (osteoarthritis)   . Obese   . Preglaucoma   . SOB (shortness of breath)   . Uveitis    AS A TEENAGER    Past Surgical History:  Procedure Laterality Date  . CARDIAC SURGERY    . COLONOSCOPY     PT WAS [place]- 2-3 TINY POLYPS  . KNEE ARTHROSCOPY Right 2005  . MITRAL VALVE REPAIR    . SHOULDER ARTHROSCOPY Right 2005  . TEE WITHOUT CARDIOVERSION N/A 04/01/2016   Procedure: TRANSESOPHAGEAL ECHOCARDIOGRAM (TEE);  Surgeon: Jerline Pain, MD;  Location: [facility] ENDOSCOPY;  Service: Cardiovascular;  Laterality: N/A;    Current Medications: Current Meds  Medication Sig  . aspirin EC 81 MG tablet Take 81 mg by mouth every morning.  Marland Kitchen atorvastatin (LIPITOR) 40 MG tablet Take 40 mg by mouth at bedtime.  . metoprolol succinate (TOPROL-XL) 25 MG 24 hr tablet Take 1 tablet (25 mg total) by mouth daily.  . tamsulosin (FLOMAX) 0.4 MG CAPS capsule Take 0.4 mg by mouth daily.  Marland Kitchen torsemide (DEMADEX) 10 MG tablet Take 10 mg by mouth daily.  . valsartan (DIOVAN) 320 MG tablet Take 320 mg by mouth daily.  . [DISCONTINUED] amLODipine (NORVASC) 10 MG tablet Take 10 mg  by mouth daily.  . [DISCONTINUED] metoprolol succinate (TOPROL-XL) 25 MG 24 hr tablet Take 25 mg by mouth daily.     Allergies:   Prednisone and Amiodarone   Social History   Socioeconomic History  . Marital status: Married    Spouse name: Not on file  . Number of children: 3  . Years of education: Not on file  . Highest education level: Not on file  Occupational History  . 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 Smoking status: Never Smoker  . Smokeless tobacco: Never Used  Substance and Sexual Activity  . Alcohol use: Yes  . Drug use: No  . Sexual activity: Not on file  Other Topics Concern  . Not on file  Social History Narrative  . Not on file    Social Determinants of Health   Financial Resource Strain:   . Difficulty of Paying Living Expenses: Not on file  Food Insecurity:   . Worried About Charity fundraiser in the Last Year: Not on file  . Ran Out of Food in the Last Year: Not on file  Transportation Needs:   . Lack of Transportation (Medical): Not on file  . Lack of Transportation (Non-Medical): Not on file  Physical Activity:   . Days of Exercise per Week: Not on file  . Minutes of Exercise per Session: Not on file  Stress:   . Feeling of Stress : Not on file  Social Connections:   . Frequency of Communication with Friends and Family: Not on file  . Frequency of Social Gatherings with Friends and Family: Not on file  . Attends Religious Services: Not on file  . Active Member of Clubs or Organizations: Not on file  . Attends Archivist Meetings: Not on file  . Marital Status: Not on file     Family History: The patient's family history includes Colon cancer in his father; Heart Problems in his father and mother.  ROS:   Please see the history of present illness.    Denies any fevers chills nausea vomiting syncope bleeding all other systems reviewed and are negative.  EKGs/Labs/Other Studies Reviewed:    The following studies were reviewed today:  Office notes from [facility] cardiology, Dr. Virgina Jock reviewed.  Echocardiogram personally reviewed images.  Echocardiogram 12/20/2019:  1. Left ventricular ejection fraction, by visual estimation, is 50 to  55%. The left ventricle has normal function. Left ventricular septal wall  thickness was normal. Normal left ventricular posterior wall thickness.  There is no left ventricular  hypertrophy.  2. Definity contrast agent was given IV to delineate the left ventricular  endocardial borders.  3. Left ventricular diastolic function could not be evaluated.  4. The left ventricle demonstrates regional wall motion abnormalities.  5. Septal hypokineiss.  Compared with echo 123456, systolic function has  improved.  6. Global right ventricle has normal systolic function.The right  ventricular size is normal. No increase in right ventricular wall  thickness.  7. Left atrial size was normal.  8. Right atrial size was normal.  9. The mitral valve has been repaired/replaced. Trivial mitral valve  regurgitation. No evidence of mitral stenosis.  10. The tricuspid valve is normal in structure.  11. The tricuspid valve is normal in structure. Tricuspid valve  regurgitation is mild.  12. The aortic valve is normal in structure. Aortic valve regurgitation is  not visualized. No evidence of aortic valve sclerosis or stenosis.  13. The pulmonic valve was normal  in structure. Pulmonic valve  regurgitation is not visualized.  14. Normal pulmonary artery systolic pressure.  15. The inferior vena cava is normal in size with greater than 50%  respiratory variability, suggesting right atrial pressure of 3 mmHg.  16. MV peak gradient, 5.6 mmHg.   EKG:  EKG is  ordered today.  12/23/2019-sinus rhythm 82 no other abnormalities.  Recent Labs: No results found for requested labs within last 8760 hours.  Recent Lipid Panel No results found for: CHOL, TRIG, HDL, CHOLHDL, VLDL, LDLCALC, LDLDIRECT  Physical Exam:    VS:  BP 122/66   Pulse 82   Ht 5\' 10"  (1.778 m)   Wt (!) 357 lb (161.9 kg)   SpO2 96%   BMI 51.22 kg/m     Wt Readings from Last 3 Encounters:  12/23/19 (!) 357 lb (161.9 kg)  04/13/16 (!) 311 lb 9.6 oz (141.3 kg)  04/06/16 (!) 313 lb 7.9 oz (142.2 kg)     GEN: Obese Well nourished, well developed in no acute distress HEENT: Normal NECK: No JVD; No carotid bruits LYMPHATICS: No lymphadenopathy CARDIAC: RRR, no murmurs, rubs, gallops RESPIRATORY:  Clear to auscultation without rales, wheezing or rhonchi  ABDOMEN: Soft, non-tender, non-distended MUSCULOSKELETAL:  No edema; No deformity  SKIN: Warm and dry NEUROLOGIC:  Alert and  oriented x 3 PSYCHIATRIC:  Normal affect   ASSESSMENT:    1. Shortness of breath   2. S/P Maze operation for atrial fibrillation   3. S/P MVR (mitral valve repair)   4. Morbid obesity ([place])   5. Nonischemic cardiomyopathy (HCC)    PLAN:    In order of problems listed above:  Paroxysmal atrial fibrillation -Prior Maze procedure, PFO closure, left atrial appendage ligation in [place] -Was on anticoagulation on warfarin surrounding surgery. -Prior blisters and rash on amiodarone surrounding his mitral valve surgery. -Currently sinus rhythm.  Been doing well.  Mitral valve repair -Continue with dental antibiotics prophylaxis. -03/2016-status post acute severe mitral regurgitation secondary to ruptured mitral valve cord.  [place].  Stable.  Nonischemic cardiomyopathy -Prior EF 45%.  Current EF 50 to 55%.  Excellent.  No coronary disease on catheterization  History of pneumonia -Gram-positive bacteremia MRSA following cardiac surgery.  Essential hypertension -Continuing on metoprolol torsemide valsartan.  Well-controlled.  Since he occasionally will have blood pressures in the double digits, 90s over 50s and feel sluggish with this, I will discontinue his amlodipine 10 mg.  We will also make sure that he is on metoprolol succinate once a day.  Morbid obesity/dyspnea -Continue to encourage weight loss exercise.  There is possible concern of secondary pulmonary hypertension from morbid obesity and presumed obstructive sleep apnea.  Thankfully, echocardiogram does not demonstrate any evidence of increased pulmonary pressures.  His dyspnea is most likely related to deconditioning, morbid obesity.  He has been referred by Dr. Virgina Jock for OSA evaluation.  I encouraged him to get the sleep study evaluation.  His right ventricle diameter was 4 cm, mildly dilated.  This does go along with his obesity and potential sleep apnea.  Once again thankfully, he did not have elevated pulmonary  pressures.  In 1 month, we will have a virtual visit with APP to check up on his blood pressures after discontinuation of amlodipine 10 mg.  He can come back and see me in 6 months.   Medication Adjustments/Labs and Tests Ordered: Current medicines are reviewed at length with the patient today.  Concerns regarding medicines are outlined above.  Orders Placed This  Encounter  Procedures  . EKG 12-Lead   Meds ordered this encounter  Medications  . metoprolol succinate (TOPROL-XL) 25 MG 24 hr tablet    Sig: Take 1 tablet (25 mg total) by mouth daily.    Dispense:  90 tablet    Refill:  3    Patient Instructions  Medication Instructions:  Please discontinue your Amlodipine. Change Metoprolol Tartrate to Metoprolol Succinate 25 mg once a day.  Continue all other medications as listed.  *If you need a refill on your cardiac medications before your next appointment, please call your pharmacy*  Follow-Up: At [facility], you and your health needs are our priority.  As part of our continuing mission to provide you with exceptional heart care, we have created designated Provider Care Teams.  These Care Teams include your primary Cardiologist (physician) and Advanced Practice Providers (APPs -  Physician Assistants and Nurse Practitioners) who all work together to provide you with the care you need, when you need it.  Your next appointment:   4 week(s)  The format for your next appointment:   Virtual Visit   Provider:   Kathyrn Drown, NP   And in person with Dr Marlou Porch in 6 months.  Thank you for choosing [facility]!!    YOUR CARDIOLOGY TEAM HAS ARRANGED FOR AN E-VISIT FOR YOUR APPOINTMENT - PLEASE REVIEW IMPORTANT INFORMATION BELOW SEVERAL DAYS PRIOR TO YOUR APPOINTMENT  Due to the recent COVID-19 pandemic, we are transitioning in-person office visits to tele-medicine visits in an effort to decrease unnecessary exposure to our patients, their families, and staff. These  visits are billed to your insurance just like a normal visit is. We also encourage you to sign up for MyChart if you have not already done so. You will need a smartphone if possible. For patients that do not have this, we can still complete the visit using a regular telephone but do prefer a smartphone to enable video when possible. You may have a family member that lives with you that can help. If possible, we also ask that you have a blood pressure cuff and scale at home to measure your blood pressure, heart rate and weight prior to your scheduled appointment. Patients with clinical needs that need an in-person evaluation and testing will still be able to come to the office if absolutely necessary. If you have any questions, feel free to call our office.  YOUR PROVIDER WILL BE USING THE FOLLOWING PLATFORM TO COMPLETE YOUR VISIT: DOXY.ME THE DAY OF YOUR APPOINTMENT  Approximately 15 minutes prior to your scheduled appointment, you will receive a telephone call from one of [place] team - your caller ID may say "Unknown caller."  Our staff will confirm medications, vital signs for the day and any symptoms you may be experiencing. Please have this information available prior to the time of visit start. It may also be helpful for you to have a pad of paper and pen handy for any instructions given during your visit. They will also walk you through joining the smartphone meeting if this is a video visit.  CONSENT FOR TELE-HEALTH VISIT - PLEASE REVIEW  I hereby voluntarily request, consent and authorize [facility] and its employed or contracted physicians, physician assistants, nurse practitioners or other licensed health care professionals (the Practitioner), to provide me with telemedicine health care services (the "Services") as deemed necessary by the treating Practitioner. I acknowledge and consent to receive the Services by the Practitioner via telemedicine. I understand that the  telemedicine visit  will involve communicating with the Practitioner through live audiovisual communication technology and the disclosure of certain medical information by electronic transmission. I acknowledge that I have been given the opportunity to request an in-person assessment or other available alternative prior to the telemedicine visit and am voluntarily participating in the telemedicine visit.  I understand that I have the right to withhold or withdraw my consent to the use of telemedicine in the course of my care at any time, without affecting my right to future care or treatment, and that the Practitioner or I may terminate the telemedicine visit at any time. I understand that I have the right to inspect all information obtained and/or recorded in the course of the telemedicine visit and may receive copies of available information for a reasonable fee.  I understand that some of the potential risks of receiving the Services via telemedicine include:  Marland Kitchen Delay or interruption in medical evaluation due to technological equipment failure or disruption; . Information transmitted may not be sufficient (e.g. poor resolution of images) to allow for appropriate medical decision making by the Practitioner; and/or  . In rare instances, security protocols could fail, causing a breach of personal health information.  Furthermore, I acknowledge that it is my responsibility to provide information about my medical history, conditions and care that is complete and accurate to the best of my ability. I acknowledge that Practitioner's advice, recommendations, and/or decision may be based on factors not within their control, such as incomplete or inaccurate data provided by me or distortions of diagnostic images or specimens that may result from electronic transmissions. I understand that the practice of medicine is not an exact science and that Practitioner makes no warranties or guarantees regarding treatment outcomes. I acknowledge  that I will receive a copy of this consent concurrently upon execution via email to the email address I last provided but may also request a printed copy by calling the office of [place].    I understand that my insurance will be billed for this visit.   I have read or had this consent read to me. . I understand the contents of this consent, which adequately explains the benefits and risks of the Services being provided via telemedicine.  . I have been provided ample opportunity to ask questions regarding this consent and the Services and have had my questions answered to my satisfaction. . I give my informed consent for the services to be provided through the use of telemedicine in my medical care  By participating in this telemedicine visit I agree to the above.      Signed, Candee Furbish, MD  12/23/2019 10:11 AM    [place]

## 2019-12-23 NOTE — Patient Instructions (Signed)
Medication Instructions:  Please discontinue your Amlodipine. Change Metoprolol Tartrate to Metoprolol Succinate 25 mg once a day.  Continue all other medications as listed.  *If you need a refill on your cardiac medications before your next appointment, please call your pharmacy*  Follow-Up: At [facility], you and your health needs are our priority.  As part of our continuing mission to provide you with exceptional heart care, we have created designated Provider Care Teams.  These Care Teams include your primary Cardiologist (physician) and Advanced Practice Providers (APPs -  Physician Assistants and Nurse Practitioners) who all work together to provide you with the care you need, when you need it.  Your next appointment:   4 week(s)  The format for your next appointment:   Virtual Visit   Provider:   Kathyrn Drown, NP   And in person with Dr Marlou Porch in 6 months.  Thank you for choosing [facility]!!    YOUR CARDIOLOGY TEAM HAS ARRANGED FOR AN E-VISIT FOR YOUR APPOINTMENT - PLEASE REVIEW IMPORTANT INFORMATION BELOW SEVERAL DAYS PRIOR TO YOUR APPOINTMENT  Due to the recent COVID-19 pandemic, we are transitioning in-person office visits to tele-medicine visits in an effort to decrease unnecessary exposure to our patients, their families, and staff. These visits are billed to your insurance just like a normal visit is. We also encourage you to sign up for MyChart if you have not already done so. You will need a smartphone if possible. For patients that do not have this, we can still complete the visit using a regular telephone but do prefer a smartphone to enable video when possible. You may have a family member that lives with you that can help. If possible, we also ask that you have a blood pressure cuff and scale at home to measure your blood pressure, heart rate and weight prior to your scheduled appointment. Patients with clinical needs that need an in-person evaluation and  testing will still be able to come to the office if absolutely necessary. If you have any questions, feel free to call our office.  YOUR PROVIDER WILL BE USING THE FOLLOWING PLATFORM TO COMPLETE YOUR VISIT: DOXY.ME THE DAY OF YOUR APPOINTMENT  Approximately 15 minutes prior to your scheduled appointment, you will receive a telephone call from one of [place] team - your caller ID may say "Unknown caller."  Our staff will confirm medications, vital signs for the day and any symptoms you may be experiencing. Please have this information available prior to the time of visit start. It may also be helpful for you to have a pad of paper and pen handy for any instructions given during your visit. They will also walk you through joining the smartphone meeting if this is a video visit.  CONSENT FOR TELE-HEALTH VISIT - PLEASE REVIEW  I hereby voluntarily request, consent and authorize [facility] and its employed or contracted physicians, physician assistants, nurse practitioners or other licensed health care professionals (the Practitioner), to provide me with telemedicine health care services (the "Services") as deemed necessary by the treating Practitioner. I acknowledge and consent to receive the Services by the Practitioner via telemedicine. I understand that the telemedicine visit will involve communicating with the Practitioner through live audiovisual communication technology and the disclosure of certain medical information by electronic transmission. I acknowledge that I have been given the opportunity to request an in-person assessment or other available alternative prior to the telemedicine visit and am voluntarily participating in the telemedicine visit.  I understand  that I have the right to withhold or withdraw my consent to the use of telemedicine in the course of my care at any time, without affecting my right to future care or treatment, and that the Practitioner or I may terminate the  telemedicine visit at any time. I understand that I have the right to inspect all information obtained and/or recorded in the course of the telemedicine visit and may receive copies of available information for a reasonable fee.  I understand that some of the potential risks of receiving the Services via telemedicine include:  Marland Kitchen Delay or interruption in medical evaluation due to technological equipment failure or disruption; . Information transmitted may not be sufficient (e.g. poor resolution of images) to allow for appropriate medical decision making by the Practitioner; and/or  . In rare instances, security protocols could fail, causing a breach of personal health information.  Furthermore, I acknowledge that it is my responsibility to provide information about my medical history, conditions and care that is complete and accurate to the best of my ability. I acknowledge that Practitioner's advice, recommendations, and/or decision may be based on factors not within their control, such as incomplete or inaccurate data provided by me or distortions of diagnostic images or specimens that may result from electronic transmissions. I understand that the practice of medicine is not an exact science and that Practitioner makes no warranties or guarantees regarding treatment outcomes. I acknowledge that I will receive a copy of this consent concurrently upon execution via email to the email address I last provided but may also request a printed copy by calling the office of [place].    I understand that my insurance will be billed for this visit.   I have read or had this consent read to me. . I understand the contents of this consent, which adequately explains the benefits and risks of the Services being provided via telemedicine.  . I have been provided ample opportunity to ask questions regarding this consent and the Services and have had my questions answered to my satisfaction. . I give my informed  consent for the services to be provided through the use of telemedicine in my medical care  By participating in this telemedicine visit I agree to the above.

## 2020-01-21 NOTE — Progress Notes (Signed)
Virtual Visit via Telephone Note   This visit type was conducted due to national recommendations for restrictions regarding the COVID-19 Pandemic (e.g. social distancing) in an effort to limit this patient's exposure and mitigate transmission in our community.  Due to his co-morbid illnesses, this patient is at least at moderate risk for complications without adequate follow up.  This format is felt to be most appropriate for this patient at this time.  The patient did not have access to video technology/had technical difficulties with video requiring transitioning to audio format only (telephone).  All issues noted in this document were discussed and addressed.  No physical exam could be performed with this format.  Please refer to the patient's chart for his  consent to telehealth for [facility].   The patient was identified using 2 identifiers.  Date:  01/23/2020   ID:  Henry Dougherty, DOB [birth year], MRN [id number]  Patient Location: Home Provider Location: Home  PCP:  Shon Baton, MD  Cardiologist:  Candee Furbish, MD  Electrophysiologist:  None   Evaluation Performed:  Follow-Up Visit  Chief Complaint:  Hypotension   History of Present Illness:    Henry Dougherty is a 59 y.o. male with a hx of atrial fibrillation. He underwent Maze procedure as well as left atrial clipping and closure of PFO in [place].   In May 2017 he developed sudden onset shortness of breath went to emergency department in [facility] in [place] where he was found to be in atrial fibrillation and also had a flail segment of mitral valve.  On 03/17/2016 he underwent mitral valve repair and atrial Maze procedure by Dr. Manuella Ghazi.  Incidental finding of PFO closed at the time of surgery.  After the surgery however he was admitted for hypoxic respiratory failure and found to have hospital-acquired pneumonia. Right pleural effusion was present and he was in rapid atrial  flutter.  He had acute renal failure. He was diuresed and was re-loaded on amiodarone.   TEE performed secondary to to single blood culture which was positive for MRSA which was unremarkable..   On torsemide for volume overload. He had fatigue with BB. Dyspnea on exertion really had not changed despite torsemide and change in metoprolol.  At follow up, was still having dyspnea however this was felt to be related to deconditioning given a reassuring echo with no overt changes. Was encouraged to obtain sleep study evaluation. His amlodipine was discontinued therefore Dr. Marlou Porch wanted him seen to ensure stable BP.   Today he presents via telemedicine visit reports his BP has been more stable, 121/65 today.  He states that his symptoms have improved with a little higher BP.  Has no other complaints today.  His weight is actually down several pounds at 353lb today.  Again discussed his dyspnea felt to be related to deconditioning.  Reinforced increasing his aerobic exercise for hopeful improvement.  Recently saw Dr. Virgina Jock for complete lab work with no changes per patient's report.  Will contact their office for lab work to be faxed for our.  No medication changes today.  Denies chest pain, LE edema, palpitations, orthopnea, dizziness or syncope.    The patient does not have symptoms concerning for COVID-19 infection (fever, chills, cough, or new shortness of breath).    Past Medical History:  Diagnosis Date  . Allergic rhinitis   . CHF (congestive heart failure) (HCC)    EF 45%/ VALVULAR CM  . DOE (dyspnea on  exertion)   . Dyspepsia   . Environmental allergies   . Hypertension   . OA (osteoarthritis)   . Obese   . Preglaucoma   . SOB (shortness of breath)   . Uveitis    AS A TEENAGER   Past Surgical History:  Procedure Laterality Date  . CARDIAC SURGERY    . COLONOSCOPY     PT WAS [facility]- 2-3 TINY POLYPS  . KNEE ARTHROSCOPY Right 2005  . MITRAL VALVE REPAIR    . SHOULDER ARTHROSCOPY  Right 2005  . TEE WITHOUT CARDIOVERSION N/A 04/01/2016   Procedure: TRANSESOPHAGEAL ECHOCARDIOGRAM (TEE);  Surgeon: Jerline Pain, MD;  Location: [facility] ENDOSCOPY;  Service: Cardiovascular;  Laterality: N/A;     Current Meds  Medication Sig  . aspirin EC 81 MG tablet Take 81 mg by mouth every morning.  . metoprolol succinate (TOPROL-XL) 25 MG 24 hr tablet Take 1 tablet (25 mg total) by mouth daily.  . tamsulosin (FLOMAX) 0.4 MG CAPS capsule Take 0.4 mg by mouth daily.  Marland Kitchen torsemide (DEMADEX) 10 MG tablet Take 10 mg by mouth daily.  . valsartan (DIOVAN) 320 MG tablet Take 320 mg by mouth daily.     Allergies:   Prednisone and Amiodarone   Social History   Tobacco Use  . Smoking status: Never Smoker  . Smokeless tobacco: Never Used  Substance Use Topics  . Alcohol use: Yes  . Drug use: No     Family Hx: The patient's family history includes Colon cancer in his father; Heart Problems in his father and mother.  ROS:   Please see the history of present illness.     All other systems reviewed and are negative.  Prior CV studies:   The following studies were reviewed today:  Echocardiogram 12/20/2019:  1. Left ventricular ejection fraction, by visual estimation, is 50 to  55%. The left ventricle has normal function. Left ventricular septal wall  thickness was normal. Normal left ventricular posterior wall thickness.  There is no left ventricular  hypertrophy.  2. Definity contrast agent was given IV to delineate the left ventricular  endocardial borders.  3. Left ventricular diastolic function could not be evaluated.  4. The left ventricle demonstrates regional wall motion abnormalities.  5. Septal hypokineiss. Compared with echo 123456, systolic function has  improved.  6. Global right ventricle has normal systolic function.The right  ventricular size is normal. No increase in right ventricular wall  thickness.  7. Left atrial size was normal.  8. Right atrial size was  normal.  9. The mitral valve has been repaired/replaced. Trivial mitral valve  regurgitation. No evidence of mitral stenosis.  10. The tricuspid valve is normal in structure.  11. The tricuspid valve is normal in structure. Tricuspid valve  regurgitation is mild.  12. The aortic valve is normal in structure. Aortic valve regurgitation is  not visualized. No evidence of aortic valve sclerosis or stenosis.  13. The pulmonic valve was normal in structure. Pulmonic valve  regurgitation is not visualized.  14. Normal pulmonary artery systolic pressure.  15. The inferior vena cava is normal in size with greater than 50%  respiratory variability, suggesting right atrial pressure of 3 mmHg.  16. MV peak gradient, 5.6 mmHg.   Labs/Other Tests and Data Reviewed:    EKG:  No ECG reviewed.  Recent Labs: No results found for requested labs within last 8760 hours.   Recent Lipid Panel No results found for: CHOL, TRIG, HDL, CHOLHDL, LDLCALC,  LDLDIRECT  Wt Readings from Last 3 Encounters:  12/23/19 (!) 357 lb (161.9 kg)  04/13/16 (!) 311 lb 9.6 oz (141.3 kg)  04/06/16 (!) 313 lb 7.9 oz (142.2 kg)     Objective:    Vital Signs:  BP 121/65   Pulse 94   Ht 5\' 10"  (1.778 m)   BMI 51.22 kg/m    VITAL SIGNS:  reviewed GEN:  no acute distress NEURO:  alert and oriented x 3, no obvious focal deficit PSYCH:  normal affect  ASSESSMENT & PLAN:    1. Paroxysmal atrial fibrillation: -Underwent Maze procedure with PFO closure and left atrial appendage ligation in [place]  -Previously on Coumadin after surgery>> subsequently has been discontinued  -Denies palpitations  -NSR at last office visit   2. Mitral valve repair: -03/2016-status post acute severe mitral regurgitation in [place]. -Continue with dental antibiotics prophylaxis  3. Nonischemic cardiomyopathy: -Prior EF 45%.  Current EF 50 to 55%. -No CAD on catheterization  4. Essential hypertension: -Most recently has  been having symptoms of orthostatic dizziness therefore amlodipine was discontinued at last office visit  -BP more stable today at 121/65 with symptom improvement  -We will continue current regimen at this time and follow-up in 6 months with Dr. Marlou Porch   5. Morbid obesity/dyspnea: -Continue to encourage weight loss exercise>>was concern of secondary pulmonary hypertension from morbid obesity and presumed obstructive sleep apnea. No PHTN on echocardiogram  -Referred for sleep study  with Dr. Virgina Jock -Encouraged increased aerobic activity, likely related to morbid obesity and deconditioning as previously stated by Dr. Marlou Porch. -Weight down several pounds from last OV at 352 pounds today from 357   COVID-19 Education: The signs and symptoms of COVID-19 were discussed with the patient and how to seek care for testing (follow up with PCP or arrange E-visit).  The importance of social distancing was discussed today.  Time:   Today, I have spent 20 minutes with the patient with telehealth technology discussing the above problems.     Medication Adjustments/Labs and Tests Ordered: Current medicines are reviewed at length with the patient today.  Concerns regarding medicines are outlined above.   Tests Ordered: No orders of the defined types were placed in this encounter.   Medication Changes: No orders of the defined types were placed in this encounter.   Follow Up:  Either In Person or Virtual Dr. Marlou Porch in 6 months  Signed, Kathyrn Drown, NP  01/23/2020 10:28 AM    [place]

## 2020-01-23 ENCOUNTER — Other Ambulatory Visit: Payer: Self-pay

## 2020-01-23 ENCOUNTER — Telehealth (INDEPENDENT_AMBULATORY_CARE_PROVIDER_SITE_OTHER): Payer: No Typology Code available for payment source | Admitting: Cardiology

## 2020-01-23 VITALS — BP 121/65 | HR 94 | Ht 70.0 in

## 2020-01-23 DIAGNOSIS — Z9889 Other specified postprocedural states: Secondary | ICD-10-CM | POA: Diagnosis not present

## 2020-01-23 DIAGNOSIS — I428 Other cardiomyopathies: Secondary | ICD-10-CM | POA: Diagnosis not present

## 2020-01-23 DIAGNOSIS — Z8679 Personal history of other diseases of the circulatory system: Secondary | ICD-10-CM

## 2020-01-23 DIAGNOSIS — I9589 Other hypotension: Secondary | ICD-10-CM

## 2020-01-23 DIAGNOSIS — R0602 Shortness of breath: Secondary | ICD-10-CM | POA: Diagnosis not present

## 2020-01-23 NOTE — Patient Instructions (Signed)
Medication Instructions:   Your physician recommends that you continue on your current medications as directed. Please refer to the Current Medication list given to you today.  *If you need a refill on your cardiac medications before your next appointment, please call your pharmacy*  Lab Work:  None ordered today  If you have labs (blood work) drawn today and your tests are completely normal, you will receive your results only by: Marland Kitchen MyChart Message (if you have MyChart) OR . A paper copy in the mail If you have any lab test that is abnormal or we need to change your treatment, we will call you to review the results.  Testing/Procedures:  None ordered today  Follow-Up: At [facility], you and your health needs are our priority.  As part of our continuing mission to provide you with exceptional heart care, we have created designated Provider Care Teams.  These Care Teams include your primary Cardiologist (physician) and Advanced Practice Providers (APPs -  Physician Assistants and Nurse Practitioners) who all work together to provide you with the care you need, when you need it.  We recommend signing up for the patient portal called "MyChart".  Sign up information is provided on this After Visit Summary.  MyChart is used to connect with patients for Virtual Visits (Telemedicine).  Patients are able to view lab/test results, encounter notes, upcoming appointments, etc.  Non-urgent messages can be sent to your provider as well.   To learn more about what you can do with MyChart, go to [email or web address].    Your next appointment:   6 month(s)  The format for your next appointment:   In Person  Provider:   Candee Furbish, MD

## 2020-02-04 MED ORDER — TORSEMIDE 10 MG PO TABS: 10.0000 mg | ORAL_TABLET | Freq: Every day | ORAL | 3 refills | Status: DC

## 2020-02-04 MED ORDER — VALSARTAN 320 MG PO TABS: 320.0000 mg | ORAL_TABLET | Freq: Every day | ORAL | 3 refills | Status: DC

## 2020-02-04 NOTE — Telephone Encounter (Signed)
Pt is requesting a refill on tamsulosin. Would Dr. Marlou Porch like to refill this medication? Please address

## 2020-02-07 NOTE — Telephone Encounter (Signed)
His primary should refill this medication, Tamsulosin Candee Furbish, MD

## 2020-03-14 DIAGNOSIS — I4891 Unspecified atrial fibrillation: Secondary | ICD-10-CM

## 2020-03-14 HISTORY — DX: Unspecified atrial fibrillation: I48.91

## 2020-03-30 ENCOUNTER — Telehealth: Payer: Self-pay | Admitting: Cardiology

## 2020-03-30 ENCOUNTER — Emergency Department (HOSPITAL_COMMUNITY): Payer: No Typology Code available for payment source

## 2020-03-30 ENCOUNTER — Other Ambulatory Visit: Payer: Self-pay

## 2020-03-30 ENCOUNTER — Encounter (HOSPITAL_COMMUNITY): Payer: Self-pay | Admitting: Emergency Medicine

## 2020-03-30 ENCOUNTER — Inpatient Hospital Stay (HOSPITAL_COMMUNITY)
Admission: EM | Admit: 2020-03-30 | Discharge: 2020-04-01 | DRG: 309 | Disposition: A | Discharge status: Home / Self Care | Payer: No Typology Code available for payment source | Attending: Cardiology | Admitting: Cardiology

## 2020-03-30 DIAGNOSIS — I484 Atypical atrial flutter: Secondary | ICD-10-CM | POA: Diagnosis present

## 2020-03-30 DIAGNOSIS — Z7982 Long term (current) use of aspirin: Secondary | ICD-10-CM | POA: Diagnosis not present

## 2020-03-30 DIAGNOSIS — Z8774 Personal history of (corrected) congenital malformations of heart and circulatory system: Secondary | ICD-10-CM | POA: Diagnosis not present

## 2020-03-30 DIAGNOSIS — Z952 Presence of prosthetic heart valve: Secondary | ICD-10-CM

## 2020-03-30 DIAGNOSIS — I5022 Chronic systolic (congestive) heart failure: Secondary | ICD-10-CM | POA: Diagnosis present

## 2020-03-30 DIAGNOSIS — Z6841 Body Mass Index (BMI) 40.0 and over, adult: Secondary | ICD-10-CM | POA: Diagnosis not present

## 2020-03-30 DIAGNOSIS — Z823 Family history of stroke: Secondary | ICD-10-CM

## 2020-03-30 DIAGNOSIS — I071 Rheumatic tricuspid insufficiency: Secondary | ICD-10-CM | POA: Diagnosis present

## 2020-03-30 DIAGNOSIS — Z8249 Family history of ischemic heart disease and other diseases of the circulatory system: Secondary | ICD-10-CM

## 2020-03-30 DIAGNOSIS — I700 Atherosclerosis of aorta: Secondary | ICD-10-CM | POA: Diagnosis present

## 2020-03-30 DIAGNOSIS — I4891 Unspecified atrial fibrillation: Secondary | ICD-10-CM | POA: Diagnosis present

## 2020-03-30 DIAGNOSIS — I4892 Unspecified atrial flutter: Secondary | ICD-10-CM | POA: Diagnosis present

## 2020-03-30 DIAGNOSIS — N1832 Chronic kidney disease, stage 3b: Secondary | ICD-10-CM | POA: Diagnosis present

## 2020-03-30 DIAGNOSIS — Z8241 Family history of sudden cardiac death: Secondary | ICD-10-CM | POA: Diagnosis not present

## 2020-03-30 DIAGNOSIS — I130 Hypertensive heart and chronic kidney disease with heart failure and stage 1 through stage 4 chronic kidney disease, or unspecified chronic kidney disease: Secondary | ICD-10-CM | POA: Diagnosis present

## 2020-03-30 DIAGNOSIS — Z79899 Other long term (current) drug therapy: Secondary | ICD-10-CM | POA: Diagnosis not present

## 2020-03-30 DIAGNOSIS — Z888 Allergy status to other drugs, medicaments and biological substances status: Secondary | ICD-10-CM

## 2020-03-30 DIAGNOSIS — R0902 Hypoxemia: Secondary | ICD-10-CM | POA: Diagnosis present

## 2020-03-30 DIAGNOSIS — I340 Nonrheumatic mitral (valve) insufficiency: Secondary | ICD-10-CM | POA: Diagnosis not present

## 2020-03-30 DIAGNOSIS — Z800 Family history of malignant neoplasm of digestive organs: Secondary | ICD-10-CM

## 2020-03-30 DIAGNOSIS — I4430 Unspecified atrioventricular block: Secondary | ICD-10-CM | POA: Diagnosis present

## 2020-03-30 DIAGNOSIS — I493 Ventricular premature depolarization: Secondary | ICD-10-CM | POA: Diagnosis present

## 2020-03-30 DIAGNOSIS — I428 Other cardiomyopathies: Secondary | ICD-10-CM | POA: Diagnosis present

## 2020-03-30 DIAGNOSIS — Z20822 Contact with and (suspected) exposure to covid-19: Secondary | ICD-10-CM | POA: Diagnosis present

## 2020-03-30 DIAGNOSIS — G4733 Obstructive sleep apnea (adult) (pediatric): Secondary | ICD-10-CM | POA: Diagnosis present

## 2020-03-30 DIAGNOSIS — N183 Chronic kidney disease, stage 3 unspecified: Secondary | ICD-10-CM | POA: Diagnosis not present

## 2020-03-30 LAB — CBC
HCT: 48.7 % (ref 39.0–52.0)
Hemoglobin: 15.8 g/dL (ref 13.0–17.0)
MCH: 31.7 pg (ref 26.0–34.0)
MCHC: 32.4 g/dL (ref 30.0–36.0)
MCV: 97.6 fL (ref 80.0–100.0)
Platelets: 280 10*3/uL (ref 150–400)
RBC: 4.99 MIL/uL (ref 4.22–5.81)
RDW: 13 % (ref 11.5–15.5)
WBC: 9.1 10*3/uL (ref 4.0–10.5)
nRBC: 0 % (ref 0.0–0.2)

## 2020-03-30 LAB — BASIC METABOLIC PANEL
Anion gap: 13 (ref 5–15)
BUN: 21 mg/dL — ABNORMAL HIGH (ref 6–20)
CO2: 23 mmol/L (ref 22–32)
Calcium: 8.8 mg/dL — ABNORMAL LOW (ref 8.9–10.3)
Chloride: 105 mmol/L (ref 98–111)
Creatinine, Ser: 1.52 mg/dL — ABNORMAL HIGH (ref 0.61–1.24)
GFR calc Af Amer: 57 mL/min — ABNORMAL LOW (ref >60)
GFR calc non Af Amer: 49 mL/min — ABNORMAL LOW (ref >60)
Glucose, Bld: 99 mg/dL (ref 70–99)
Potassium: 4.7 mmol/L (ref 3.5–5.1)
Sodium: 141 mmol/L (ref 135–145)

## 2020-03-30 LAB — MAGNESIUM: Magnesium: 2 mg/dL (ref 1.7–2.4)

## 2020-03-30 LAB — HIV ANTIBODY (ROUTINE TESTING W REFLEX): HIV Screen 4th Generation wRfx: NONREACTIVE

## 2020-03-30 LAB — SARS CORONAVIRUS 2 BY RT PCR (HOSPITAL ORDER, PERFORMED IN ~~LOC~~ HOSPITAL LAB): SARS Coronavirus 2: NEGATIVE

## 2020-03-30 LAB — TSH: TSH: 1.459 u[IU]/mL (ref 0.350–4.500)

## 2020-03-30 MED ORDER — TORSEMIDE 20 MG PO TABS
10.0000 mg | ORAL_TABLET | Freq: Every day | ORAL | Status: DC
  Administered 2020-03-31 – 2020-04-01 (×2): 10 mg via ORAL
  Filled 2020-03-30 (×2): qty 1

## 2020-03-30 MED ORDER — DILTIAZEM LOAD VIA INFUSION
20.0000 mg | Freq: Once | INTRAVENOUS | Status: AC
  Administered 2020-03-30: 20 mg via INTRAVENOUS
  Filled 2020-03-30: qty 20

## 2020-03-30 MED ORDER — SODIUM CHLORIDE 0.9 % IV BOLUS
500.0000 mL | Freq: Once | INTRAVENOUS | Status: AC
  Administered 2020-03-30: 500 mL via INTRAVENOUS

## 2020-03-30 MED ORDER — NITROGLYCERIN 0.4 MG SL SUBL: 0.4000 mg | SUBLINGUAL_TABLET | SUBLINGUAL | Status: DC | PRN

## 2020-03-30 MED ORDER — ACETAMINOPHEN 325 MG PO TABS: 650.0000 mg | ORAL_TABLET | ORAL | Status: DC | PRN

## 2020-03-30 MED ORDER — DILTIAZEM HCL-DEXTROSE 125-5 MG/125ML-% IV SOLN (PREMIX)
15.0000 mg/h | INTRAVENOUS | Status: DC
  Administered 2020-03-30: 5 mg/h via INTRAVENOUS
  Administered 2020-03-30 – 2020-04-01 (×5): 15 mg/h via INTRAVENOUS
  Filled 2020-03-30 (×8): qty 125

## 2020-03-30 MED ORDER — ONDANSETRON HCL 4 MG/2ML IJ SOLN: 4.0000 mg | Freq: Four times a day (QID) | INTRAMUSCULAR | Status: DC | PRN

## 2020-03-30 MED ORDER — APIXABAN 5 MG PO TABS
5.0000 mg | ORAL_TABLET | Freq: Two times a day (BID) | ORAL | Status: DC
  Administered 2020-03-31 – 2020-04-01 (×4): 5 mg via ORAL
  Filled 2020-03-30 (×5): qty 1

## 2020-03-30 MED ORDER — TAMSULOSIN HCL 0.4 MG PO CAPS
0.4000 mg | ORAL_CAPSULE | Freq: Every day | ORAL | Status: DC
  Administered 2020-04-01: 0.4 mg via ORAL
  Filled 2020-03-30: qty 1

## 2020-03-30 MED ORDER — SODIUM CHLORIDE 0.9% FLUSH
3.0000 mL | Freq: Once | INTRAVENOUS | Status: AC
  Administered 2020-03-30: 3 mL via INTRAVENOUS

## 2020-03-30 MED ORDER — IRBESARTAN 300 MG PO TABS
300.0000 mg | ORAL_TABLET | Freq: Every day | ORAL | Status: DC
  Administered 2020-03-31 – 2020-04-01 (×2): 300 mg via ORAL
  Filled 2020-03-30 (×2): qty 1

## 2020-03-30 MED ORDER — METOPROLOL SUCCINATE ER 50 MG PO TB24
50.0000 mg | ORAL_TABLET | Freq: Every day | ORAL | Status: DC
  Administered 2020-03-30 – 2020-04-01 (×3): 50 mg via ORAL
  Filled 2020-03-30: qty 2
  Filled 2020-03-30: qty 1
  Filled 2020-03-30: qty 2

## 2020-03-30 MED ORDER — APIXABAN 5 MG PO TABS
10.0000 mg | ORAL_TABLET | Freq: Once | ORAL | Status: AC
  Administered 2020-03-30: 10 mg via ORAL
  Filled 2020-03-30: qty 2

## 2020-03-30 NOTE — ED Triage Notes (Signed)
Came in POV c/o shortness at times and  Palpitations. Reported has hx of atrial fibrillation and open heart surgery. Denies pain when asked.

## 2020-03-30 NOTE — Consult Note (Deleted)
Cardiology Consultation:   Patient ID: Henry Dougherty MRN: [id number]; DOB: 03-25-1961  Admit date: 03/30/2020 Date of Consult: 03/30/2020  Primary Care Provider: Shon Baton, MD Primary Cardiologist: Candee Furbish, MD  Primary Electrophysiologist:  None    Patient Profile:   Henry Dougherty is a 59 y.o. male with a history of mitral valve repair in 03/2016 in [place], atrial fibrillation s/p MAZE procedure as well as left atrial clipping and closure of PFO at time of MVR, chronic systolic CHF with improved EF of 50-55% on Echo in 12/2019,  hypertension, and morbid obesity who is being seen today for the evaluation of atrial flutter with RVR at the request of Dr. Regenia Skeeter (ED).  History of Present Illness:   Henry Dougherty is a 59 year old male with the above history. In 03/2016, patient developed sudden onset of shortness of breath and went to the ED at [facility] in [place], [place] where he was found to be in atrial fibrillation and was also found to have a flail segment of his mitral valve. He underwent mitral valve repair and MAZE procedure as well as PFO closure, which was found incidentally, on 03/17/2016. About 12 days after his surgery, he was readmitted here at [facility] for hypoxic respiratory failure and was found to have hospital-acquired pneumonia. He was in atrial flutter with RVR at that time and was also found to have acute renal failure. He was diuresed and reloaded on Amiodarone at that time. Echo at this time showed LVEF of 30-35% with diffuse hypokinesis. Single blood culture came back positive for MRSA. TEE was performed and was unremarkable. He was discharge on lasix, Lopressor, and Warfarin. Patient previously seen by Cardiology at [facility] who switched patient to Torsemide with no significant improvement in dyspnea. It looks like a right/left heart catheterization was recommended at office visit at [facility] in 05/2019 but it does not look like this was ever done.  Patient was referred to Dr. Marlou Porch on 12/23/2019 by PCP for further evaluation of dyspnea. Dyspnea felt to be secondary to deconditioning and morbid obesity. Amlodipine was discontinued due to soft BP at times.   Patient presented to the [facility] ED today for elevated heart rate.  Patient states he travels a lot with his work and drives to [place] every other week.  Last week when he was [place] he had a couple of episodes of lightheadedness and unsteadiness on his feet when he stood up quickly.  He has had problems with his BP in the past so he thought it may be be that his BP was too low.  When he got back from [place] at the end of last week, he checked his blood pressure and it was 95/65.  However, heart rate was in the 140s. He continued to monitor his  BP and heart rate over the weekend.  BP improved a little but heart rates remained elevated.  Therefore, he decided to come to the ED today for further evaluation.  He denies any palpitations no additional lightheadedness/dizziness since the end of last week.  No syncope.  Dyspnea on exertion is stable and has not worsened.  Does note some occasional high chest discomfort that he describes as a chest burning sensation when he takes a deep breath, "like when you are running when it is cold outside."  No exertional chest pain.  No radiating symptoms.  He has stable chronic two-pillow orthopnea.  No recent PND.  No lower extremity edema.  No recent fevers or  illnesses.  No abnormal bleeding.  Upon arrival to the ED, patient tachycardic. EKG showed atrial flutter with 2:1 AV conduction and rate of 150 bpm. Chest x-ray showed chronic lower lung surgical changes on the right but no acute findings. WBC 9.1, Hgb 15.8, Plts 280. Na 14, K 4.7, Glucose 99, BUN 21, Cr 1.52. Mg 2.0.    Patient started on IV Cardizem with no significant improvement in rates.  Resting comfortably at time of this evaluation.  Past Medical History:  Diagnosis Date  . Allergic rhinitis    . CHF (congestive heart failure) (HCC)    EF 45%/ VALVULAR CM  . DOE (dyspnea on exertion)   . Dyspepsia   . Environmental allergies   . Hypertension   . OA (osteoarthritis)   . Obese   . Preglaucoma   . SOB (shortness of breath)   . Uveitis    AS A TEENAGER    Past Surgical History:  Procedure Laterality Date  . CARDIAC SURGERY    . COLONOSCOPY     PT WAS [facility]- 2-3 TINY POLYPS  . KNEE ARTHROSCOPY Right 2005  . MITRAL VALVE REPAIR    . SHOULDER ARTHROSCOPY Right 2005  . TEE WITHOUT CARDIOVERSION N/A 04/01/2016   Procedure: TRANSESOPHAGEAL ECHOCARDIOGRAM (TEE);  Surgeon: Jerline Pain, MD;  Location: [facility] ENDOSCOPY;  Service: Cardiovascular;  Laterality: N/A;     Home Medications:  Prior to Admission medications   Medication Sig Start Date End Date Taking? Authorizing Provider  aspirin EC 81 MG tablet Take 81 mg by mouth every morning.   Yes [provider]  metoprolol succinate (TOPROL-XL) 25 MG 24 hr tablet Take 1 tablet (25 mg total) by mouth daily. 12/23/19  Yes Jerline Pain, MD  tamsulosin (FLOMAX) 0.4 MG CAPS capsule Take 0.4 mg by mouth daily. 10/16/19  Yes [provider]  torsemide (DEMADEX) 10 MG tablet Take 1 tablet (10 mg total) by mouth daily. 02/04/20  Yes Jerline Pain, MD  valsartan (DIOVAN) 320 MG tablet Take 1 tablet (320 mg total) by mouth daily. 02/04/20  Yes Jerline Pain, MD    Inpatient Medications: Scheduled Meds:  Continuous Infusions: . diltiazem (CARDIZEM) infusion 15 mg/hr (03/30/20 1507)   PRN Meds:   Allergies:    Allergies  Allergen Reactions  . Amiodarone     Blisters and whelps to IV FORM.  Marland Kitchen Cortisone     Glaucoma HX ,  Triggers increased pressure in eye    Social History:   Social History   Socioeconomic History  . Marital status: Married    Spouse name: Not on file  . Number of children: 3  . Years of education: Not on file  . Highest education level: Not on file  Occupational History  . 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 Smoking status: Never Smoker  . Smokeless tobacco: Never Used  Substance and Sexual Activity  . Alcohol use: Yes  . Drug use: No  . Sexual activity: Not on file  Other Topics Concern  . Not on file  Social History Narrative  . Not on file   Social Determinants of Health   Financial Resource Strain:   . Difficulty of Paying Living Expenses:   Food Insecurity:   . Worried About Charity fundraiser in the Last Year:   . Arboriculturist in the Last Year:   Transportation Needs:   . Film/video editor (Medical):   Marland Kitchen  Lack of Transportation (Non-Medical):   Physical Activity:   . Days of Exercise per Week:   . Minutes of Exercise per Session:   Stress:   . Feeling of Stress :   Social Connections:   . Frequency of Communication with Friends and Family:   . Frequency of Social Gatherings with Friends and Family:   . Attends Religious Services:   . Active Member of Clubs or Organizations:   . Attends Archivist Meetings:   Marland Kitchen Marital Status:   Intimate Partner Violence:   . Fear of Current or Ex-Partner:   . Emotionally Abused:   Marland Kitchen Physically Abused:   . Sexually Abused:     Family History:    Family History  Problem Relation Age of Onset  . Heart Problems Mother   . Atrial fibrillation Mother   . Colon cancer Father   . Heart Problems Father   . Heart attack Maternal Grandfather        died from heart attack in his 7's  . Heart attack Paternal Grandfather   . Stroke Paternal Grandfather      ROS:  Please see the history of present illness.  All other ROS reviewed and negative.     Physical Exam/Data:   Vitals:   03/30/20 1437 03/30/20 1500 03/30/20 1530 03/30/20 1600  BP: 111/66     Pulse: (!) 141 (!) 140 (!) 139 (!) 139  Resp: 16 16 (!) 25 14  Temp:      TempSrc:      SpO2: 95% 95% 95% 95%  Weight:      Height:       No intake or output data in the 24 hours ending 03/30/20 1635 Last 3 Weights  03/30/2020 12/23/2019 04/13/2016  Weight (lbs) 355 lb 357 lb 311 lb 9.6 oz  Weight (kg) 161.027 kg 161.934 kg 141.341 kg     Body mass index is 50.94 kg/m.  General: 59 y.o. morbidly obese Caucasian male resting comfortably in no acute distress. HEENT: Normocephalic and atraumatic. Sclera clear.  Neck: Supple. No carotid bruits. JVD unable to be assessed due to body habitus. Heart: Tachycardic with irregular rhythm. Distinct S1 and S2. No murmurs, gallops, or rubs. Radial pulses 2+ and equal bilaterally. Lungs: No increased work of breathing. Clear to ausculation bilaterally. No wheezes, rhonchi, or rales.  Abdomen: Soft, non-distended, and non-tender to palpation. Bowel sounds present.  Extremities: Trace lower extremity edema.    Skin: Warm and dry. Neuro: Alert and oriented x3. No focal deficits. Psych: Normal affect. Responds appropriately.   EKG:  The EKG was personally reviewed and demonstrates: Atrial flutter with 2:1 AV block. Rate in the 150's bpm. PVC. Mild ST depression in lead II; otherwise, no acute ST/T changes.  Telemetry:  Telemetry was personally reviewed and demonstrates:  Atrial flutter with PVC. Rates in the 130's to 150's.  Relevant CV Studies:  Echocardiogram 12/20/2019: Impressions: 1. Left ventricular ejection fraction, by visual estimation, is 50 to  55%. The left ventricle has normal function. Left ventricular septal wall  thickness was normal. Normal left ventricular posterior wall thickness.  There is no left ventricular  hypertrophy.  2. Definity contrast agent was given IV to delineate the left ventricular  endocardial borders.  3. Left ventricular diastolic function could not be evaluated.  4. The left ventricle demonstrates regional wall motion abnormalities.  5. Septal hypokineiss. Compared with echo 123456, systolic function has  improved.  6. Global right ventricle has normal systolic function.The  right  ventricular size is normal. No increase  in right ventricular wall  thickness.  7. Left atrial size was normal.  8. Right atrial size was normal.  9. The mitral valve has been repaired/replaced. Trivial mitral valve  regurgitation. No evidence of mitral stenosis.  10. The tricuspid valve is normal in structure.  11. The tricuspid valve is normal in structure. Tricuspid valve  regurgitation is mild.  12. The aortic valve is normal in structure. Aortic valve regurgitation is  not visualized. No evidence of aortic valve sclerosis or stenosis.  13. The pulmonic valve was normal in structure. Pulmonic valve  regurgitation is not visualized.  14. Normal pulmonary artery systolic pressure.  15. The inferior vena cava is normal in size with greater than 50%  respiratory variability, suggesting right atrial pressure of 3 mmHg.  16. MV peak gradient, 5.6 mmHg. _______________   Laboratory Data:  High Sensitivity Troponin:  No results for input(s): TROPONINIHS in the last 720 hours.   Chemistry Recent Labs  Lab 03/30/20 1331  NA 141  K 4.7  CL 105  CO2 23  GLUCOSE 99  BUN 21*  CREATININE 1.52*  CALCIUM 8.8*  GFRNONAA 49*  GFRAA 57*  ANIONGAP 13    No results for input(s): PROT, ALBUMIN, AST, ALT, ALKPHOS, BILITOT in the last 168 hours. Hematology Recent Labs  Lab 03/30/20 1331  WBC 9.1  RBC 4.99  HGB 15.8  HCT 48.7  MCV 97.6  MCH 31.7  MCHC 32.4  RDW 13.0  PLT 280   BNPNo results for input(s): BNP, PROBNP in the last 168 hours.  DDimer No results for input(s): DDIMER in the last 168 hours.   Radiology/Studies:  DG Chest Portable 1 View  Result Date: 03/30/2020 CLINICAL DATA:  Short of breath.  Palpitations. EXAM: PORTABLE CHEST 1 VIEW COMPARISON:  04/05/2016. FINDINGS: Cardiac silhouette is mildly enlarged. There are changes from previous valve replacement, stable. No mediastinal or hilar masses. Mild hazy opacity is noted in the right mid to lower lung with associated linear opacities. Several surgical  vascular clips project over the right heart border. Subtle pulmonary anastomosis staple line noted in the inferior right lung. Right upper lung is clear. Left lung is clear. No convincing pleural effusion and no pneumothorax. Skeletal structures are grossly intact. IMPRESSION: 1. No acute cardiopulmonary disease. 2. Chronic lower lung surgical changes on the right. Electronically Signed   By: Lajean Manes M.D.   On: 03/30/2020 12:56    Assessment and Plan:   Atrial Flutter with RVR / History of Atrial Fibrillation/Flutter s/p MAZE - Patient presented with shortness of breath and palpitations. Found to be in atrial flutter with RVR. History of atrial flutter/fibrillation s/p MAZE procedure with left atrial appendage clipping at time of MVR in 03/2016.  - Started on IV Cardizem with no significant improvement in rates. Rates still in the 130's to 140's.  - Recent Echo in 12/2019 showed LVEF of 50-55% with septal hypokinesis.  - Potassium and Magnesium normal at 4.7 and 2.0, re - Continue IV Cardizem for now.  - Will increase home Toprol-XL to 50mg  daily.  - Will check TSH.  - Wonder if patient could have PE given frequent travel. Suspect D-dimer would likely be positive; however, would likely get poor images with CTA and V/Q scan given body habitus. Discussed with MD - will hold off on D-dimer for now.  - Suspect patient has been in atrial flutter for at least the past 4 days. Therefore,  may need to avoid Amiodarone given risk of stroke with conversion to sinus rhythm.  - CHA2DS2-VASc = 2 (CHF, HTN). Will start anticoagulation with Eliquis. Will give 10mg  tonight and then start 5mg  twice daily tomorrow. Discussed with MD with plan for TEE/DCCV tomorrow.  Chronic Systolic CHF/ Non-Ischemic Cardiomyopathy - EF as low as 30-35% in 2017 but improved to 50-55% on Echo in 12/2019.  - Chronic dyspnea on exertion felt to be due to deconditioning and morbid obesity. - Patient appears euvolemic on exam.  -  Continue PO Torsemide 10mg  daily.  - Will increase home Toprol-XL to 50mg  daily as above. - Patient on Valsartan 320mg  daily at home. Will place on equivalent dose of Irbesartan here as home med is not on formulary.  S/p Mitral Valve Repair - Patient had mitral valve repair in 03/2016 while in [place] for work due to flail segment. - Most recent Echo showed trivial MR with no evidence of stenosis. MV peak gradient 5.6 mmHg.  Hypertension - History of hypertension but has more recently had signs/symptoms of orthostatic hypotension. Systolic BP last week in the 90's with lightheadedness on standing. Amlodipine discontinued at office visit in 12/2019.  - BP stable here. Systolic BP in the [id number] to 120's. - Continue Toprol and Irbesartan as above.  - Continue to monitor BP closely, especially while on Cardizem drip.  CKD Stage III Creatinine 1.52 on admission.  Looks like most recent baseline from [place] around 1.2 to 1.4; however, in the 1.5 to 2.0 range in 2017. - Continue to monitor.    Suspected Sleep Apnea - Patient reports snoring. Suspect he has obstructive sleep apnea. Per last office visit note, has been referred for sleep study by PCP.  Morbid Obesity - Continue to encourage weight loss.    Severity of Illness: The appropriate patient status for this patient is INPATIENT. Inpatient status is judged to be reasonable and necessary in order to provide the required intensity of service to ensure the patient's safety. The patient's presenting symptoms, physical exam findings, and initial radiographic and laboratory data in the context of their chronic comorbidities is felt to place them at high risk for further clinical deterioration. Furthermore, it is not anticipated that the patient will be medically stable for discharge from the hospital within 2 midnights of admission. The following factors support the patient status of inpatient.   " The patient's presenting symptoms  include tachycardia. " The worrisome physical exam findings include tachycardia. " The initial radiographic and laboratory data are worrisome because of atrial flutter with RVR. " The chronic co-morbidities include chronic systolic CHF, HTN, morbid obesity.   * I certify that at the point of admission it is my clinical judgment that the patient will require inpatient hospital care spanning beyond 2 midnights from the point of admission due to high intensity of service, high risk for further deterioration and high frequency of surveillance required.*      For questions or updates, please contact [place] Please consult [email or web address] for contact info under     Signed, Darreld Mclean, PA-C  03/30/2020 4:35 PM

## 2020-03-30 NOTE — H&P (Addendum)
Cardiology Consultation:   Patient ID: Henry Dougherty MRN: [id number]; DOB: 14-Oct-1961  Admit date: 03/30/2020 Date of Consult: 03/30/2020  Primary Care Provider: Shon Baton, MD Primary Cardiologist: Candee Furbish, MD  Primary Electrophysiologist:  None    Patient Profile:   Henry Dougherty is a 59 y.o. male with a history of mitral valve repair in 03/2016 in [place], atrial fibrillation s/p MAZE procedure as well as left atrial clipping and closure of PFO at time of MVR, chronic systolic CHF with improved EF of 50-55% on Echo in 12/2019,  hypertension, and morbid obesity who is being seen today for the evaluation of atrial flutter with RVR at the request of Dr. Regenia Skeeter (ED).  History of Present Illness:   Henry Dougherty is a 59 year old male with the above history. In 03/2016, patient developed sudden onset of shortness of breath and went to the ED at [facility] in [place], [place] where he was found to be in atrial fibrillation and was also found to have a flail segment of his mitral valve. He underwent mitral valve repair and MAZE procedure as well as PFO closure, which was found incidentally, on 03/17/2016. About 12 days after his surgery, he was readmitted here at [facility] for hypoxic respiratory failure and was found to have hospital-acquired pneumonia. He was in atrial flutter with RVR at that time and was also found to have acute renal failure. He was diuresed and reloaded on Amiodarone at that time. Echo at this time showed LVEF of 30-35% with diffuse hypokinesis. Single blood culture came back positive for MRSA. TEE was performed and was unremarkable. He was discharge on lasix, Lopressor, and Warfarin. Patient previously seen by Cardiology at [facility] who switched patient to Torsemide with no significant improvement in dyspnea. It looks like a right/left heart catheterization was recommended at office visit at [facility] in 05/2019 but it does not look like this was ever done.  Patient was referred to Dr. Marlou Porch on 12/23/2019 by PCP for further evaluation of dyspnea. Dyspnea felt to be secondary to deconditioning and morbid obesity. Amlodipine was discontinued due to soft BP at times.   Patient presented to the [facility] ED today for elevated heart rate.  Patient states he travels a lot with his work and drives to [place] every other week.  Last week when he was [place] he had a couple of episodes of lightheadedness and unsteadiness on his feet when he stood up quickly.  He has had problems with his BP in the past so he thought it may be be that his BP was too low.  When he got back from [place] at the end of last week, he checked his blood pressure and it was 95/65.  However, heart rate was in the 140s. He continued to monitor his  BP and heart rate over the weekend.  BP improved a little but heart rates remained elevated.  Therefore, he decided to come to the ED today for further evaluation.  He denies any palpitations no additional lightheadedness/dizziness since the end of last week.  No syncope.  Dyspnea on exertion is stable and has not worsened.  Does note some occasional high chest discomfort that he describes as a chest burning sensation when he takes a deep breath, "like when you are running when it is cold outside."  No exertional chest pain.  No radiating symptoms.  He has stable chronic two-pillow orthopnea.  No recent PND.  No lower extremity edema.  No recent fevers or  illnesses.  No abnormal bleeding.  Upon arrival to the ED, patient tachycardic. EKG showed atrial flutter with 2:1 AV conduction and rate of 150 bpm. Chest x-ray showed chronic lower lung surgical changes on the right but no acute findings. WBC 9.1, Hgb 15.8, Plts 280. Na 14, K 4.7, Glucose 99, BUN 21, Cr 1.52. Mg 2.0.    Patient started on IV Cardizem with no significant improvement in rates.  Resting comfortably at time of this evaluation.  Past Medical History:  Diagnosis Date  . Allergic rhinitis    . CHF (congestive heart failure) (HCC)    EF 45%/ VALVULAR CM  . DOE (dyspnea on exertion)   . Dyspepsia   . Environmental allergies   . Hypertension   . OA (osteoarthritis)   . Obese   . Preglaucoma   . SOB (shortness of breath)   . Uveitis    AS A TEENAGER    Past Surgical History:  Procedure Laterality Date  . CARDIAC SURGERY    . COLONOSCOPY     PT WAS Ledbetter- 2-3 TINY POLYPS  . KNEE ARTHROSCOPY Right 2005  . MITRAL VALVE REPAIR    . SHOULDER ARTHROSCOPY Right 2005  . TEE WITHOUT CARDIOVERSION N/A 04/01/2016   Procedure: TRANSESOPHAGEAL ECHOCARDIOGRAM (TEE);  Surgeon: Jerline Pain, MD;  [facility] END[facility]COPY;  Service: Cardiovascular;  Laterality: N/A;     Home Medications:  Prior to Admission medications   Medication Sig Start Date End Date Taking? Authorizing Provider  aspirin EC 81 MG tablet Take 81 mg by mouth every morning.   Yes [provider]  metoprolol succinate (TOPROL-XL) 25 MG 24 hr tablet Take 1 tablet (25 mg total) by mouth daily. 12/23/19  Yes Jerline Pain, MD  tamsulosin (FLOMAX) 0.4 MG CAPS capsule Take 0.4 mg by mouth daily. 10/16/19  Yes [provider]  torsemide (DEMADEX) 10 MG tablet Take 1 tablet (10 mg total) by mouth daily. 02/04/20  Yes Jerline Pain, MD  valsartan (DIOVAN) 320 MG tablet Take 1 tablet (320 mg total) by mouth daily. 02/04/20  Yes Jerline Pain, MD    Inpatient Medications: Scheduled Meds:  Continuous Infusions: . diltiazem (CARDIZEM) infusion 15 mg/hr (03/30/20 1507)   PRN Meds:   Allergies:    Allergies  Allergen Reactions  . Amiodarone     Blisters and whelps to IV FORM.  Marland Kitchen Cortisone     Glaucoma HX ,  Triggers increased pressure in eye    Social History:   Social History   Socioeconomic History  . Marital status: Married    Spouse name: Not on file  . Number of children: 3  . Years of education: Not on file  . Highest education level: Not on file  Occupational History  . 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 Smoking status: Never Smoker  . Smokeless tobacco: Never Used  Substance and Sexual Activity  . Alcohol use: Yes  . Drug use: No  . Sexual activity: Not on file  Other Topics Concern  . Not on file  Social History Narrative  . Not on file   Social Determinants of Health   Financial Resource Strain:   . Difficulty of Paying Living Expenses:   Food Insecurity:   . Worried About Charity fundraiser in the Last Year:   . Arboriculturist in the Last Year:   Transportation Needs:   . Film/video editor (Medical):   Marland Kitchen  Lack of Transportation (Non-Medical):   Physical Activity:   . Days of Exercise per Week:   . Minutes of Exercise per Session:   Stress:   . Feeling of Stress :   Social Connections:   . Frequency of Communication with Friends and Family:   . Frequency of Social Gatherings with Friends and Family:   . Attends Religious Services:   . Active Member of Clubs or Organizations:   . Attends Archivist Meetings:   Marland Kitchen Marital Status:   Intimate Partner Violence:   . Fear of Current or Ex-Partner:   . Emotionally Abused:   Marland Kitchen Physically Abused:   . Sexually Abused:     Family History:    Family History  Problem Relation Age of Onset  . Heart Problems Mother   . Atrial fibrillation Mother   . Colon cancer Father   . Heart Problems Father   . Heart attack Maternal Grandfather        died from heart attack in his 38's  . Heart attack Paternal Grandfather   . Stroke Paternal Grandfather      ROS:  Please see the history of present illness.  All other ROS reviewed and negative.     Physical Exam/Data:   Vitals:   03/30/20 1437 03/30/20 1500 03/30/20 1530 03/30/20 1600  BP: 111/66     Pulse: (!) 141 (!) 140 (!) 139 (!) 139  Resp: 16 16 (!) 25 14  Temp:      TempSrc:      SpO2: 95% 95% 95% 95%  Weight:      Height:       No intake or output data in the 24 hours ending 03/30/20 1635 Last 3 Weights  03/30/2020 12/23/2019 04/13/2016  Weight (lbs) 355 lb 357 lb 311 lb 9.6 oz  Weight (kg) 161.027 kg 161.934 kg 141.341 kg     Body mass index is 50.94 kg/m.  General: 59 y.o. morbidly obese Caucasian male resting comfortably in no acute distress. HEENT: Normocephalic and atraumatic. Sclera clear.  Neck: Supple. No carotid bruits. JVD unable to be assessed due to body habitus. Heart: Tachycardic with irregular rhythm. Distinct S1 and S2. No murmurs, gallops, or rubs. Radial pulses 2+ and equal bilaterally. Lungs: No increased work of breathing. Clear to ausculation bilaterally. No wheezes, rhonchi, or rales.  Abdomen: Soft, non-distended, and non-tender to palpation. Bowel sounds present.  Extremities: Trace lower extremity edema.    Skin: Warm and dry. Neuro: Alert and oriented x3. No focal deficits. Psych: Normal affect. Responds appropriately.   EKG:  The EKG was personally reviewed and demonstrates: Atrial flutter with 2:1 AV block. Rate in the 150's bpm. PVC. Mild ST depression in lead II; otherwise, no acute ST/T changes.  Telemetry:  Telemetry was personally reviewed and demonstrates:  Atrial flutter with PVC. Rates in the 130's to 150's.  Relevant CV Studies:  Echocardiogram 12/20/2019: Impressions: 1. Left ventricular ejection fraction, by visual estimation, is 50 to  55%. The left ventricle has normal function. Left ventricular septal wall  thickness was normal. Normal left ventricular posterior wall thickness.  There is no left ventricular  hypertrophy.  2. Definity contrast agent was given IV to delineate the left ventricular  endocardial borders.  3. Left ventricular diastolic function could not be evaluated.  4. The left ventricle demonstrates regional wall motion abnormalities.  5. Septal hypokineiss. Compared with echo 123456, systolic function has  improved.  6. Global right ventricle has normal systolic function.The  right  ventricular size is normal. No increase  in right ventricular wall  thickness.  7. Left atrial size was normal.  8. Right atrial size was normal.  9. The mitral valve has been repaired/replaced. Trivial mitral valve  regurgitation. No evidence of mitral stenosis.  10. The tricuspid valve is normal in structure.  11. The tricuspid valve is normal in structure. Tricuspid valve  regurgitation is mild.  12. The aortic valve is normal in structure. Aortic valve regurgitation is  not visualized. No evidence of aortic valve sclerosis or stenosis.  13. The pulmonic valve was normal in structure. Pulmonic valve  regurgitation is not visualized.  14. Normal pulmonary artery systolic pressure.  15. The inferior vena cava is normal in size with greater than 50%  respiratory variability, suggesting right atrial pressure of 3 mmHg.  16. MV peak gradient, 5.6 mmHg. _______________   Laboratory Data:  High Sensitivity Troponin:  No results for input(s): TROPONINIHS in the last 720 hours.   Chemistry Recent Labs  Lab 03/30/20 1331  NA 141  K 4.7  CL 105  CO2 23  GLUCOSE 99  BUN 21*  CREATININE 1.52*  CALCIUM 8.8*  GFRNONAA 49*  GFRAA 57*  ANIONGAP 13    No results for input(s): PROT, ALBUMIN, AST, ALT, ALKPHOS, BILITOT in the last 168 hours. Hematology Recent Labs  Lab 03/30/20 1331  WBC 9.1  RBC 4.99  HGB 15.8  HCT 48.7  MCV 97.6  MCH 31.7  MCHC 32.4  RDW 13.0  PLT 280   BNPNo results for input(s): BNP, PROBNP in the last 168 hours.  DDimer No results for input(s): DDIMER in the last 168 hours.   Radiology/Studies:  DG Chest Portable 1 View  Result Date: 03/30/2020 CLINICAL DATA:  Short of breath.  Palpitations. EXAM: PORTABLE CHEST 1 VIEW COMPARISON:  04/05/2016. FINDINGS: Cardiac silhouette is mildly enlarged. There are changes from previous valve replacement, stable. No mediastinal or hilar masses. Mild hazy opacity is noted in the right mid to lower lung with associated linear opacities. Several surgical  vascular clips project over the right heart border. Subtle pulmonary anastomosis staple line noted in the inferior right lung. Right upper lung is clear. Left lung is clear. No convincing pleural effusion and no pneumothorax. Skeletal structures are grossly intact. IMPRESSION: 1. No acute cardiopulmonary disease. 2. Chronic lower lung surgical changes on the right. Electronically Signed   By: Lajean Manes M.D.   On: 03/30/2020 12:56    Assessment and Plan:   Atrial Flutter with RVR / History of Atrial Fibrillation/Flutter s/p MAZE - Patient presented with shortness of breath and palpitations. Found to be in atrial flutter with RVR. History of atrial flutter/fibrillation s/p MAZE procedure with left atrial appendage clipping at time of MVR in 03/2016.  - Started on IV Cardizem with no significant improvement in rates. Rates still in the 130's to 140's.  - Recent Echo in 12/2019 showed LVEF of 50-55% with septal hypokinesis.  - Potassium and Magnesium normal at 4.7 and 2.0, re - Continue IV Cardizem for now.  - Will increase home Toprol-XL to 50mg  daily.  - Will check TSH.  - Wonder if patient could have PE given frequent travel. Suspect D-dimer would likely be positive; however, would likely get poor images with CTA and V/Q scan given body habitus. Discussed with MD - will hold off on D-dimer for now.  - Suspect patient has been in atrial flutter for at least the past 4 days. Therefore,  may need to avoid Amiodarone given risk of stroke with conversion to sinus rhythm.  - CHA2DS2-VASc = 2 (CHF, HTN). Will start anticoagulation with Eliquis. Will give 10mg  tonight and then start 5mg  twice daily tomorrow. Discussed with MD with plan for TEE/DCCV tomorrow.  Chronic Systolic CHF/ Non-Ischemic Cardiomyopathy - EF as low as 30-35% in 2017 but improved to 50-55% on Echo in 12/2019.  - Chronic dyspnea on exertion felt to be due to deconditioning and morbid obesity. - Patient appears euvolemic on exam.  -  Continue PO Torsemide 10mg  daily.  - Will increase home Toprol-XL to 50mg  daily as above. - Patient on Valsartan 320mg  daily at home. Will place on equivalent dose of Irbesartan here as home med is not on formulary.  S/p Mitral Valve Repair - Patient had mitral valve repair in 03/2016 while in [place] for work due to flail segment. - Most recent Echo showed trivial MR with no evidence of stenosis. MV peak gradient 5.6 mmHg.  Hypertension - History of hypertension but has more recently had signs/symptoms of orthostatic hypotension. Systolic BP last week in the 90's with lightheadedness on standing. Amlodipine discontinued at office visit in 12/2019.  - BP stable here. Systolic BP in the [id number] to 120's. - Continue Toprol and Irbesartan as above.  - Continue to monitor BP closely, especially while on Cardizem drip.  CKD Stage III Creatinine 1.52 on admission.  Looks like most recent baseline from [place] around 1.2 to 1.4; however, in the 1.5 to 2.0 range in 2017. - Continue to monitor.    Suspected Sleep Apnea - Patient reports snoring. Suspect he has obstructive sleep apnea. Per last office visit note, has been referred for sleep study by PCP.  Morbid Obesity - Continue to encourage weight loss.    Severity of Illness: The appropriate patient status for this patient is INPATIENT. Inpatient status is judged to be reasonable and necessary in order to provide the required intensity of service to ensure the patient's safety. The patient's presenting symptoms, physical exam findings, and initial radiographic and laboratory data in the context of their chronic comorbidities is felt to place them at high risk for further clinical deterioration. Furthermore, it is not anticipated that the patient will be medically stable for discharge from the hospital within 2 midnights of admission. The following factors support the patient status of inpatient.   " The patient's presenting symptoms  include tachycardia. " The worrisome physical exam findings include tachycardia. " The initial radiographic and laboratory data are worrisome because of atrial flutter with RVR. " The chronic co-morbidities include chronic systolic CHF, HTN, morbid obesity.   * I certify that at the point of admission it is my clinical judgment that the patient will require inpatient hospital care spanning beyond 2 midnights from the point of admission due to high intensity of service, high risk for further deterioration and high frequency of surveillance required.*      For questions or updates, please contact [place] Please consult [email or web address] for contact info under     Signed, Darreld Mclean, PA-C  03/30/2020 31:82 PM  59 year old male with complex cardiac history as noted above here with presentation of heart rate 140 bpm, shortness of breath, discovered atrial flutter with 2-1 conduction on EKG.  He checked his blood pressure at home after feeling some dizziness and it was 95 systolic and his heart rate was noted to be 140.  He is here with his wife in the emergency room.  Able to complete full senses without difficulty.  Appears fairly comfortable currently.  No chest pain.  Had some dizziness on his travels recently.  Thinks that he has been with this elevated heart rate for the last several days.  GEN: Well nourished, well developed, in no acute distress, overweight HEENT: normal  Neck: no JVD, carotid bruits, or masses Cardiac: Tachy reg; no murmurs, rubs, or gallops,no edema  Respiratory:  clear to auscultation bilaterally, normal work of breathing GI: soft, nontender, nondistended, + BS MS: no deformity or atrophy  Skin: warm and dry, no rash Neuro:  Alert and Oriented x 3, Strength and sensation are intact Psych: euthymic mood, full affect  Telemetry reviewed as well as EKG personally and interpreted and demonstrates atrial flutter with 2-1 conduction 140 bpm  Creatinine 1.52  potassium 4.7  Assessment and plan:  Paroxysmal atrial flutter -We will go ahead and get him set up for a TEE cardioversion tomorrow. -Risks and benefits have been explained including risk of esophageal damage, 1 in 10,000.  They are aware.  They have been through a transesophageal echocardiogram in the past before. -We will also begin load with Eliquis 10 mg x 1 then 5 mg twice daily thereafter. -He does have a left atrial appendage ligation. -If flutter returns, electrophysiology consultation.  On an interesting note, his mother also has atrial fibrillation and recently has about to undergo ablative therapy.  When I mention to them the possibility of utilizing Tikosyn, they both recognize the name of this drug and quickly told me that his mother had cardiac arrest after the first dose of the medication.  She was promptly attended to and ended up doing well.  Obviously, he has trepidation behind Tikosyn. -Prior Maze procedure.  Morbid obesity -Continue to encourage weight loss.  Mitral valve repair -Prior flail segment.  Candee Furbish, MD

## 2020-03-30 NOTE — Telephone Encounter (Signed)
   Pt c/o medication issue:  1. Name of Medication:   valsartan (DIOVAN) 320 MG tablet   2. How are you currently taking this medication (dosage and times per day)?   3. Are you having a reaction (difficulty breathing--STAT)?   4. What is your medication issue? He wasn't sure if its because of this medication but when he took it, his BP was low than normal and his HR is fast 101/75 HR 140 108/86 HR 139

## 2020-03-30 NOTE — ED Provider Notes (Signed)
[facility] EMERGENCY DEPARTMENT Provider Note   CSN: [id number] Arrival date & time: 03/30/20  1206     History Chief Complaint  Patient presents with  . Palpitations    Henry Dougherty is a 59 y.o. male with a history of CHF last EF 50-55% (12/20/19), atrial flutter, s/p mitral valve repair, valvular cardiomyopathy, & chronic renal insufficiency who presents to the ED with complaints of not feeling quite right x 1 week. Patient states sxs started while he was in [place], [place], he states he felt off and lightheaded. He returned home and lightheadedness seem to resolve but he persistently did not feel quite right. Worse with position changes, no alleviating factors. He has been monitoring his vitals at home starting 3-4 days ago and noted low Bps [id number] systolic and tachycardia with heart rate in the 140s. He called his cardiologist Dr. Kingsley Plan office today and was told to come to the ED, last seen in clinic 12/2019. He denies fever, chills, cough, chest pain, syncope, eg pain/swelling, hemoptysis, recent surgery/trauma, recent long travel, hormone use, personal hx of cancer, or hx of DVT/PE. He is not currently anticoagulated. He has not had any recent medication changes, was taken off amlodipine some time ago. He has baseline DOE, but this is unchanged.      HPI     Past Medical History:  Diagnosis Date  . Allergic rhinitis   . CHF (congestive heart failure) (HCC)    EF 45%/ VALVULAR CM  . DOE (dyspnea on exertion)   . Dyspepsia   . Environmental allergies   . Hypertension   . OA (osteoarthritis)   . Obese   . Preglaucoma   . SOB (shortness of breath)   . Uveitis    AS A TEENAGER    Patient Active Problem List   Diagnosis Date Noted  . Morbid obesity ([place]) 04/13/2016  . Acute on chronic renal insufficiency 04/13/2016  . Pericardial effusion   . Pleural effusion   . SOB (shortness of breath)   . Acute systolic heart failure ([place])   . Acute on chronic  combined systolic and diastolic heart failure ([place]) 04/01/2016  . Valvular cardiomyopathy ([place])   . Acute respiratory failure ([place])   . S/P Maze operation for atrial fibrillation 03/30/2016  . HCAP (healthcare-associated pneumonia) 03/30/2016  . Gram-positive cocci bacteremia - MRSA 03/30/2016  . Atrial flutter with rapid ventricular response ([place]) 03/29/2016  . Long term (current) use of anticoagulants [Z79.01] 03/29/2016  . S/P MVR (mitral valve repair) 03/29/2016  . Hypotension (arterial) 03/29/2016  . DOE (dyspnea on exertion) 03/29/2016  . Dyspnea 03/29/2016    Past Surgical History:  Procedure Laterality Date  . CARDIAC SURGERY    . COLONOSCOPY     PT WAS [place]- 2-3 TINY POLYPS  . KNEE ARTHROSCOPY Right 2005  . MITRAL VALVE REPAIR    . SHOULDER ARTHROSCOPY Right 2005  . TEE WITHOUT CARDIOVERSION N/A 04/01/2016   Procedure: TRANSESOPHAGEAL ECHOCARDIOGRAM (TEE);  Surgeon: Jerline Pain, MD;  Location: [facility] ENDOSCOPY;  Service: Cardiovascular;  Laterality: N/A;       Family History  Problem Relation Age of Onset  . Heart Problems Mother   . Colon cancer Father   . Heart Problems Father     Social History   Tobacco Use  . Smoking status: Never Smoker  . Smokeless tobacco: Never Used  Substance Use Topics  . Alcohol use: Yes  . Drug use: No    Home Medications  Prior to Admission medications   Medication Sig Start Date End Date Taking? Authorizing Provider  aspirin EC 81 MG tablet Take 81 mg by mouth every morning.    [provider]  metoprolol succinate (TOPROL-XL) 25 MG 24 hr tablet Take 1 tablet (25 mg total) by mouth daily. 12/23/19   Jerline Pain, MD  tamsulosin (FLOMAX) 0.4 MG CAPS capsule Take 0.4 mg by mouth daily. 10/16/19   [provider]  torsemide (DEMADEX) 10 MG tablet Take 1 tablet (10 mg total) by mouth daily. 02/04/20   Jerline Pain, MD  valsartan (DIOVAN) 320 MG tablet Take 1 tablet (320 mg total) by mouth daily. 02/04/20    Jerline Pain, MD    Allergies    Prednisone and Amiodarone  Review of Systems   Review of Systems  Constitutional: Negative for chills, diaphoresis and fever.       Positive for "feeling off"  Respiratory: Positive for shortness of breath (w/ exertion, chronic unchanged).   Cardiovascular: Negative for chest pain and leg swelling.  Gastrointestinal: Negative for abdominal pain, nausea and vomiting.  Neurological: Positive for light-headedness (not at present). Negative for syncope.  All other systems reviewed and are negative.   Physical Exam Updated Vital Signs BP (!) 125/99 (BP Location: Left Arm)   Pulse (!) 150   Temp 98.3 F (36.8 C) (Oral)   Resp 18   Ht 5\' 10"  (1.778 m)   Wt (!) 161 kg   SpO2 98%   BMI 50.94 kg/m   Physical Exam Vitals and nursing note reviewed.  Constitutional:      General: He is not in acute distress.    Appearance: He is well-developed. He is not toxic-appearing.  HENT:     Head: Normocephalic and atraumatic.  Eyes:     General:        Right eye: No discharge.        Left eye: No discharge.     Conjunctiva/sclera: Conjunctivae normal.  Cardiovascular:     Rate and Rhythm: Tachycardia present.  Pulmonary:     Effort: Pulmonary effort is normal. No respiratory distress.     Breath sounds: Normal breath sounds. No wheezing, rhonchi or rales.  Abdominal:     General: There is no distension.     Palpations: Abdomen is soft.     Tenderness: There is no abdominal tenderness. There is no guarding or rebound.  Musculoskeletal:     Cervical back: Neck supple.     Comments: 1-2 + symmetric pitting edema to the bilateral lower extremities without overlying erythema/warmth.   Skin:    General: Skin is warm and dry.     Findings: No rash.  Neurological:     Mental Status: He is alert.     Comments: Clear speech.   Psychiatric:        Behavior: Behavior normal.     ED Results / Procedures / Treatments   Labs (all labs ordered are  listed, but only abnormal results are displayed) Labs Reviewed  CBC  BASIC METABOLIC PANEL  MAGNESIUM    EKG EKG Interpretation  Date/Time:  Monday Mar 30 2020 12:31:22 EDT Ventricular Rate:  150 PR Interval:    QRS Duration: 88 QT Interval:  194 QTC Calculation: 306 R Axis:   21 Text Interpretation: Atrial flutter with 2 to 1 block Low voltage QRS Cannot rule out Anterior infarct , age undetermined Abnormal ECG similar to 2017 Confirmed by Sherwood Gambler [phone]) on 03/30/2020  12:41:47 PM   Radiology DG Chest Portable 1 View  Result Date: 03/30/2020 CLINICAL DATA:  Short of breath.  Palpitations. EXAM: PORTABLE CHEST 1 VIEW COMPARISON:  04/05/2016. FINDINGS: Cardiac silhouette is mildly enlarged. There are changes from previous valve replacement, stable. No mediastinal or hilar masses. Mild hazy opacity is noted in the right mid to lower lung with associated linear opacities. Several surgical vascular clips project over the right heart border. Subtle pulmonary anastomosis staple line noted in the inferior right lung. Right upper lung is clear. Left lung is clear. No convincing pleural effusion and no pneumothorax. Skeletal structures are grossly intact. IMPRESSION: 1. No acute cardiopulmonary disease. 2. Chronic lower lung surgical changes on the right. Electronically Signed   By: Lajean Manes M.D.   On: 03/30/2020 12:56    Procedures .Critical Care Performed by: Amaryllis Dyke, PA-C Authorized by: Amaryllis Dyke, PA-C     (including critical care time)  CRITICAL CARE Performed by: Kennith Maes   Total critical care time: 35 minutes  Critical care time was exclusive of separately billable procedures and treating other patients.  Critical care was necessary to treat or prevent imminent or life-threatening deterioration.  Critical care was time spent personally by me on the following activities: development of treatment plan with patient and/or  surrogate as well as nursing, discussions with consultants, evaluation of patient's response to treatment, examination of patient, obtaining history from patient or surrogate, ordering and performing treatments and interventions, ordering and review of laboratory studies, ordering and review of radiographic studies, pulse oximetry and re-evaluation of patient's condition.   Medications Ordered in ED Medications  sodium chloride flush (NS) 0.9 % injection 3 mL (has no administration in time range)    ED Course  I have reviewed the triage vital signs and the nursing notes.  Pertinent labs & imaging results that were available during my care of the patient were reviewed by me and considered in my medical decision making (see chart for details).    MDM Rules/Calculators/A&P                     Patient presents to the ED with concern of not feeling quite right x 1 week, noted to be tachycardic with soft blood pressures at home. EKG: Aflutter with RVR.  Additional history obtained:  Additional history obtained from chart review & patient's wife at bedside. Lab Tests:  I Ordered, reviewed, and interpreted labs, which included:  CBC: No anemia or leukocytosis.  BMP: Renal function improved from prior.  Mild hypocalcemia, no significant electrolyte derangement. Magnesium: WNL Imaging Studies ordered:  I ordered imaging studies which included Chest x-ray, I independently visualized and interpreted imaging which showed 1. No acute cardiopulmonary disease. 2. Chronic lower lung surgical changes on the right ED Course:  Patient presents in atrial flutter with RVR, history of same, not currently anticoagulated, greater than 72 hours from onset of symptoms and patient not hemodynamically unstable therefore not a candidate for cardioversion at this time.  Will give 500 cc of fluid for blood pressure support and start Cardizem bolus and drip.  14:25: Received 20 mg IV bolus of cardizem, HR improved to  140s from 150s, current infusion @ 5 mg/hr,  increasing shortly per nursing staff.  --> Work-up overall reassuring thus far. Labs & CXR without significant acute abnormality compared to prior. Patient without chest pain or acute dyspnea.  Unclear cause of aflutter at this time.  14:47: Cardizem infusion @ rate  of 10mg /hr, improved to 130s.  14:52: CONSULT: Discussed with cardiology master- cardiology team will come see patient in the ED to determine disposition.   Patient & his wife at bedside updated on results & plan of care, in agreement.  Patient care signed out to oncoming ED team pending cardiology evaluation.  This is a shared visit with supervising physician Dr. Regenia Skeeter who has independently evaluated patient & provided guidance in evaluation/management/disposition, in agreement with care   Portions of this note were generated with Dragon dictation software. Dictation errors may occur despite best attempts at proofreading.  Final Clinical Impression(s) / ED Diagnoses Final diagnoses:  Atrial flutter with rapid ventricular response [facility])    Rx / DC Orders ED Discharge Orders    None       Amaryllis Dyke, PA-C 03/30/20 1503    Sherwood Gambler, MD 03/31/20 [phone]

## 2020-03-30 NOTE — Telephone Encounter (Signed)
Pt called to report that his HR has been in the 130-140 range for the last 3 days his BP has been up and down but mostly low for him.. 99/60, 112/95 HR now 142.Marland Kitchen He says it feels like it is going this fast when he checks his HR manually... he is "groggy" and SOB but is not completely new for him. He can tell his heart is not beating "right".   I urged the pt to go to the ED and to now drive himself. I advised him that a heart rate this fast and him not feeling well is not normal and he needs to be assessed asap. He has h/o MVR.   Pt os having his wife drive him to the ED but I advised to call EMS if his symptoms change or worsen.

## 2020-03-31 ENCOUNTER — Encounter (HOSPITAL_COMMUNITY): Payer: Self-pay | Admitting: Cardiology

## 2020-03-31 LAB — BASIC METABOLIC PANEL
Anion gap: 8 (ref 5–15)
BUN: 17 mg/dL (ref 6–20)
CO2: 25 mmol/L (ref 22–32)
Calcium: 8.8 mg/dL — ABNORMAL LOW (ref 8.9–10.3)
Chloride: 106 mmol/L (ref 98–111)
Creatinine, Ser: 1.45 mg/dL — ABNORMAL HIGH (ref 0.61–1.24)
GFR calc Af Amer: >60 mL/min (ref >60)
GFR calc non Af Amer: 52 mL/min — ABNORMAL LOW (ref >60)
Glucose, Bld: 129 mg/dL — ABNORMAL HIGH (ref 70–99)
Potassium: 4.3 mmol/L (ref 3.5–5.1)
Sodium: 139 mmol/L (ref 135–145)

## 2020-03-31 NOTE — ED Notes (Signed)
Lunch Tray Ordered @ 1110. 

## 2020-03-31 NOTE — Discharge Instructions (Signed)

## 2020-03-31 NOTE — Plan of Care (Signed)

## 2020-03-31 NOTE — Progress Notes (Signed)
   Notified by RN that patient was requesting transfer to tertiary care center given delay in plans for TEE/DCCV scheduled for Thursday 04/02/20. Discussed options with Dr. Acie Fredrickson, DOD. With the assistance of Trish (cardmaster) we were able to get the patient on for TEE/DCCV tomorrow at 11:15 with Dr. Audie Box. Patient is agreeable to staying at this time. Will make him NPO after MN.   Abigail Butts, PA-C 03/31/20; 2:22 PM

## 2020-03-31 NOTE — Progress Notes (Signed)
Progress Note  Patient Name: Henry Dougherty Date of Encounter: 03/31/2020  Primary Cardiologist: Candee Furbish, MD   Subjective   Feels well, sitting up in bed.  Still in atrial flutter 130-140  Inpatient Medications    Scheduled Meds: . apixaban  5 mg Oral BID  . irbesartan  300 mg Oral Daily  . metoprolol succinate  50 mg Oral Daily  . tamsulosin  0.4 mg Oral Daily  . torsemide  10 mg Oral Daily   Continuous Infusions: . diltiazem (CARDIZEM) infusion 15 mg/hr (03/31/20 0410)   PRN Meds: acetaminophen, nitroGLYCERIN, ondansetron (ZOFRAN) IV   Vital Signs    Vitals:   03/31/20 0230 03/31/20 0300 03/31/20 0330 03/31/20 0400  BP: 111/84 (!) 123/95 116/68 121/79  Pulse: (!) 135 (!) 133 (!) 134 (!) 135  Resp: 12 12 13 13   Temp:      TempSrc:      SpO2: 96% 92% 95% 95%  Weight:      Height:        Intake/Output Summary (Last 24 hours) at 03/31/2020 1003 Last data filed at 03/30/2020 1637 Gross per 24 hour  Intake 500 ml  Output --  Net 500 ml   Filed Weights   03/30/20 1237  Weight: (!) 161 kg    Telemetry    Atrial flutter 2-1 conduction 1 30-1 40- Personally Reviewed  ECG    Atrial flutter rapid ventricular response- Personally Reviewed  Physical Exam   GEN: No acute distress.,  Overweight Neck: No JVD, no carotid bruits Cardiac: Regular tachycardic, no murmurs, rubs, or gallops.  Respiratory: Clear to auscultation bilaterally, no wheezes/ rales/ rhonchi GI: NABS, Soft, nontender, non-distended  MS: No edema; No deformity. Neuro:  Nonfocal, moving all extremities spontaneously Psych: Normal affect   Labs    Chemistry Recent Labs  Lab 03/30/20 1331 03/31/20 0554  NA 141 139  K 4.7 4.3  CL 105 106  CO2 23 25  GLUCOSE 99 129*  BUN 21* 17  CREATININE 1.52* 1.45*  CALCIUM 8.8* 8.8*  GFRNONAA 49* 52*  GFRAA 57* >60  ANIONGAP 13 8     Hematology Recent Labs  Lab 03/30/20 1331  WBC 9.1  RBC 4.99  HGB 15.8  HCT 48.7  MCV 97.6  MCH  31.7  MCHC 32.4  RDW 13.0  PLT 280    Cardiac EnzymesNo results for input(s): TROPONINI in the last 168 hours. No results for input(s): TROPIPOC in the last 168 hours.   BNPNo results for input(s): BNP, PROBNP in the last 168 hours.   DDimer No results for input(s): DDIMER in the last 168 hours.   Radiology    DG Chest Portable 1 View  Result Date: 03/30/2020 CLINICAL DATA:  Short of breath.  Palpitations. EXAM: PORTABLE CHEST 1 VIEW COMPARISON:  04/05/2016. FINDINGS: Cardiac silhouette is mildly enlarged. There are changes from previous valve replacement, stable. No mediastinal or hilar masses. Mild hazy opacity is noted in the right mid to lower lung with associated linear opacities. Several surgical vascular clips project over the right heart border. Subtle pulmonary anastomosis staple line noted in the inferior right lung. Right upper lung is clear. Left lung is clear. No convincing pleural effusion and no pneumothorax. Skeletal structures are grossly intact. IMPRESSION: 1. No acute cardiopulmonary disease. 2. Chronic lower lung surgical changes on the right. Electronically Signed   By: Lajean Manes M.D.   On: 03/30/2020 12:56    Cardiac Studies   Echocardiogram 12/20/2019: Impressions: 1.  Left ventricular ejection fraction, by visual estimation, is 50 to  55%. The left ventricle has normal function. Left ventricular septal wall  thickness was normal. Normal left ventricular posterior wall thickness.  There is no left ventricular  hypertrophy.  2. Definity contrast agent was given IV to delineate the left ventricular  endocardial borders.  3. Left ventricular diastolic function could not be evaluated.  4. The left ventricle demonstrates regional wall motion abnormalities.  5. Septal hypokineiss. Compared with echo [id number], systolic function has  improved.  6. Global right ventricle has normal systolic function.The right  ventricular size is normal. No increase in right  ventricular wall  thickness.  7. Left atrial size was normal.  8. Right atrial size was normal.  9. The mitral valve has been repaired/replaced. Trivial mitral valve  regurgitation. No evidence of mitral stenosis.  10. The tricuspid valve is normal in structure.  11. The tricuspid valve is normal in structure. Tricuspid valve  regurgitation is mild.  12. The aortic valve is normal in structure. Aortic valve regurgitation is  not visualized. No evidence of aortic valve sclerosis or stenosis.  13. The pulmonic valve was normal in structure. Pulmonic valve  regurgitation is not visualized.  14. Normal pulmonary artery systolic pressure.  15. The inferior vena cava is normal in size with greater than 50%  respiratory variability, suggesting right atrial pressure of 3 mmHg.  16. MV peak gradient, 5.6 mmHg.  Patient Profile     59 y.o. male with a history of mitral valve repair in 03/2016 in [place], atrial fibrillation s/p MAZE procedure as well as left atrial clipping and closure of PFO at time of MVR, chronic systolic CHF with improved EF of 50-55% on Echo in 12/2019,  hypertension, and morbid obesity who was admitted to cardiology for the evaluation of atrial flutter with RVR   Assessment & Plan    1. Atrial flutter with RVR: he has a history of atrial fibrillation/flutter s/p MAZE with LA clipping in 2017 at the time of MVR. He presented with SOB and palpitations and was found to be in recurrent atrial flutter with RVR. Started on a diltiazem gtt.  For questions or updates, please contact [place] Please consult [email or web address] for contact info under Cardiology/STEMI.      Signed, Abigail Butts, PA-C  03/31/2020, 10:03 AM   [phone]  Feels well currently,  tachycardic regular, overweight.  Lab work reviewed.  Atrial flutter -Unfortunately, there is no available slot for TEE cardioversion today or tomorrow.  First available space on the schedule is Thursday at  noon.  Reviewed this with Trish who confirmed.  We will make him n.p.o. past midnight tomorrow just in case there is unavailable drop off on the schedule.  Status post mitral valve repair -2017 [place].  Flail segment.  Most recent echocardiogram showed peak gradient of about 5 mmHg.  No evidence of stenosis.  SBE prophylaxis. -I performed last TEE.  Chronic kidney disease stage III -Ranging from 1.2-1.5.  Has been as high as 2.0 in 2017.  Avoid NSAIDs.  Morbid obesity -Continue to encourage weight loss.  Candee Furbish, MD

## 2020-03-31 NOTE — ED Notes (Signed)
After cards saw the pt earlier today the pt and his wife were concerned about the delay in scheduling for the TEE cardioversion. This RN reached out to cards. Roby Lofts, PA was able to reschedule and came to see the pt and his wife. The pt had lunch, medications and was transported to [facility].

## 2020-04-01 ENCOUNTER — Encounter (HOSPITAL_COMMUNITY)
Admission: EM | Disposition: A | Discharge status: Home / Self Care | Payer: Self-pay | Source: Home / Self Care | Attending: Cardiology

## 2020-04-01 ENCOUNTER — Inpatient Hospital Stay (HOSPITAL_COMMUNITY): Payer: No Typology Code available for payment source

## 2020-04-01 ENCOUNTER — Inpatient Hospital Stay (HOSPITAL_COMMUNITY): Payer: No Typology Code available for payment source | Admitting: Certified Registered Nurse Anesthetist

## 2020-04-01 ENCOUNTER — Other Ambulatory Visit (HOSPITAL_COMMUNITY): Payer: No Typology Code available for payment source

## 2020-04-01 ENCOUNTER — Encounter (HOSPITAL_COMMUNITY): Payer: Self-pay | Admitting: Cardiology

## 2020-04-01 DIAGNOSIS — I4891 Unspecified atrial fibrillation: Secondary | ICD-10-CM

## 2020-04-01 DIAGNOSIS — I340 Nonrheumatic mitral (valve) insufficiency: Secondary | ICD-10-CM

## 2020-04-01 DIAGNOSIS — N183 Chronic kidney disease, stage 3 unspecified: Secondary | ICD-10-CM

## 2020-04-01 HISTORY — PX: TEE WITHOUT CARDIOVERSION: SHX5443

## 2020-04-01 HISTORY — PX: CARDIOVERSION: SHX1299

## 2020-04-01 LAB — HEMOGLOBIN A1C
Hgb A1c MFr Bld: 6.1 % — ABNORMAL HIGH (ref 4.8–5.6)
Mean Plasma Glucose: 128 mg/dL

## 2020-04-01 LAB — PROTIME-INR
INR: 1.2 (ref 0.8–1.2)
Prothrombin Time: 15.1 seconds (ref 11.4–15.2)

## 2020-04-01 SURGERY — ECHOCARDIOGRAM, TRANSESOPHAGEAL
Anesthesia: General

## 2020-04-01 MED ORDER — PHENYLEPHRINE 40 MCG/ML (10ML) SYRINGE FOR IV PUSH (FOR BLOOD PRESSURE SUPPORT)
PREFILLED_SYRINGE | INTRAVENOUS | Status: DC | PRN
  Administered 2020-04-01 (×4): 80 ug via INTRAVENOUS

## 2020-04-01 MED ORDER — APIXABAN 5 MG PO TABS: 5.0000 mg | ORAL_TABLET | Freq: Two times a day (BID) | ORAL | 2 refills | Status: DC

## 2020-04-01 MED ORDER — PROPOFOL 500 MG/50ML IV EMUL
INTRAVENOUS | Status: DC | PRN
  Administered 2020-04-01: 100 ug/kg/min via INTRAVENOUS

## 2020-04-01 MED ORDER — SODIUM CHLORIDE 0.9 % IV SOLN: INTRAVENOUS | Status: DC

## 2020-04-01 MED ORDER — METOPROLOL SUCCINATE ER 100 MG PO TB24: 100.0000 mg | ORAL_TABLET | Freq: Every day | ORAL | 0 refills | Status: DC

## 2020-04-01 MED ORDER — LIDOCAINE VISCOUS HCL 2 % MT SOLN
OROMUCOSAL | Status: DC | PRN
  Administered 2020-04-01: 10 mL via OROMUCOSAL

## 2020-04-01 MED ORDER — LIDOCAINE VISCOUS HCL 2 % MT SOLN
OROMUCOSAL | Status: AC
  Filled 2020-04-01: qty 15

## 2020-04-01 NOTE — Progress Notes (Signed)
No change in tx plan MD aware of heart rate elevation. Patient is asymtomatic of HR elevation.   04/01/20 0741  Vitals  Temp 97.8 F (36.6 C)  Temp Source Oral  BP (!) 120/91  MAP (mmHg) 100  BP Location Right Arm  BP Method Automatic  Patient Position (if appropriate) Lying  Pulse Rate (!) 132  Pulse Rate Source Monitor  Resp 20  Oxygen Therapy  SpO2 97 %  O2 Device Room Air  MEWS Score  MEWS Temp 0  MEWS Systolic 0  MEWS Pulse 3  MEWS RR 0  MEWS LOC 0  MEWS Score 3  MEWS Score Color Yellow  Provider Notification  Provider Name/Title  (Dr.Skains)  Date Provider Notified 04/01/20  Time Provider Notified [phone]  Notification Type Page  Notification Reason Other (Comment) (Triggered yellow mews due to HR elevation)  Response No new orders  Date of Provider Response 04/01/20  Time of Provider Response 0833  Rapid Response Notification  Name of Rapid Response RN Notified  (not notified is chronic condition with no orders change tx)  Note  Observations  (cont current tx plan observe patient any further changes.)

## 2020-04-01 NOTE — Care Management (Signed)
Per [facility] w/ Pharmacy Help DesK( MEDIPACT) @[phone] [id number]  Co-pay amount for Eliquis 2.5 and or 5mg  bid $75.00 [facility]. [place] for 30 day supply bid ( $45.00. Mail order)  PA required @ [phone] Deductible ? [facility] 5 [facility] Pharm[facility] : [facility] $90.00 for 90 day supply. for Eliquis 2.5 mg and or 5mg . Bid. PH# [phone]

## 2020-04-01 NOTE — Discharge Summary (Addendum)
Discharge Summary    Patient ID: Henry Dougherty,  MRN: [id number], DOB/AGE: [birth year] 61 y.o.  Admit date: 03/30/2020 Discharge date: 04/01/2020  Primary Care Provider: Shon Baton Primary Cardiologist: Candee Furbish, MD  Discharge Diagnoses    Active Problems:   Atrial flutter with rapid ventricular response (HCC)   Allergies Allergies  Allergen Reactions  . Amiodarone     Blisters and whelps to IV FORM.  Marland Kitchen Cortisone     Glaucoma HX ,  Triggers increased pressure in eye    Diagnostic Studies/Procedures    Echo TEE: 04/01/20  1. EF mildly reduced 45-50%; recommend repeat TTE after cardioversion.  S/p MV repair and LAA clipping. The LAA is clipped without thrombus in the  remaining segment. Successful DCCV with return to NSR. The patient  developed profound hypoxia with snoring  while sedated. Would recommend testing for sleep apnea.  2. Left ventricular ejection fraction, by estimation, is 45 to 50%. The  left ventricle has mildly decreased function. The left ventricle  demonstrates global hypokinesis.  3. Right ventricular systolic function was not well visualized. The right  ventricular size is not well visualized.  4. A left atrial appendage clip is present. There is no apparent opening  or thrombus. No left atrial/left atrial appendage thrombus was detected.  5. The mitral valve has been repaired/replaced. Mild mitral valve  regurgitation. No evidence of mitral stenosis. The mean mitral valve  gradient is 3.7 mmHg with average heart rate of 124 bpm. There is a  prosthetic annuloplasty ring present in the mitral  position.  6. The aortic valve is tricuspid. Aortic valve regurgitation is trivial.  No aortic stenosis is present.  7. There is mild (Grade II) layered plaque involving the descending  aorta.  8. There are concerns for sleep apnea. Would recommend Sleep study.  _____________   History of Present Illness     Henry Dougherty is a 59 year old male  with a history of mitral valve repair in 03/2016 in [place], atrial fibrillation s/p MAZE procedure as well as left atrial clipping and closure of PFO at time of MVR, chronic systolic CHF with improved EF of 50-55% on Echo in 12/2019,  hypertension, and morbid obesity. In 03/2016, patient developed sudden onset of shortness of breath and went to the ED at [facility] in [place], [place] where he was found to be in atrial fibrillation and was also found to have a flail segment of his mitral valve. He underwent mitral valve repair and MAZE procedure as well as PFO closure, which was found incidentally, on 03/17/2016. About 12 days after his surgery, he was readmitted here at [facility] for hypoxic respiratory failure and was found to have hospital-acquired pneumonia. He was in atrial flutter with RVR at that time and was also found to have acute renal failure. He was diuresed and reloaded on Amiodarone at that time. Echo at this time showed LVEF of 30-35% with diffuse hypokinesis. Single blood culture came back positive for MRSA. TEE was performed and was unremarkable. He was discharge on lasix, Lopressor, and Warfarin. Patient previously seen by Cardiology at [facility] who switched patient to Torsemide with no significant improvement in dyspnea. It looks like a right/left heart catheterization was recommended at office visit at [facility] in 05/2019 but it does not look like this was ever done. Patient was referred to Dr. Marlou Porch on 12/23/2019 by PCP for further evaluation of dyspnea. Dyspnea felt to be secondary to deconditioning and morbid  obesity. Amlodipine was discontinued due to soft BP at times.   Patient presented to the [facility] ED for elevated heart rate.  Patient stated he travels a lot with his work and drives to [place] every other week.  The week prior when he was in [place] he had a couple of episodes of lightheadedness and unsteadiness on his feet when he stood up quickly.  He has had  problems with his BP in the past so he thought it may be be that his BP was too low.  When he got back from [place] at the end of last week, he checked his blood pressure and it was 95/65.  However, heart rate was in the 140s. He continued to monitor his  BP and heart rate over the weekend.  BP improved a little but heart rates remained elevated.  Therefore, he decided to come to the ED for further evaluation.  He denied any palpitations no additional lightheadedness/dizziness since the end of last week.  No syncope.  Dyspnea on exertion is stable and has not worsened.  Does note some occasional high chest discomfort that he describes as a chest burning sensation when he takes a deep breath, "like when you are running when it is cold outside."  No exertional chest pain.  No radiating symptoms.  He has stable chronic two-pillow orthopnea.  No recent PND.  No lower extremity edema.  No recent fevers or illnesses.  No abnormal bleeding.  Upon arrival to the ED, patient tachycardic. EKG showed atrial flutter with 2:1 AV conduction and rate of 150 bpm. Chest x-ray showed chronic lower lung surgical changes on the right but no acute findings. WBC 9.1, Hgb 15.8, Plts 280. Na 14, K 4.7, Glucose 99, BUN 21, Cr 1.52. Mg 2.0.    Patient started on IV Cardizem with no significant improvement in rates.   Hospital Course     Consultants: none  1. Atrial flutter, 2:1: he was initially placed on IV dilt with no significant improvement in HR. Did have successful TEE/DCCV with conversion to SR.   - started on Eliquis 5mg  BID this admission and educated by the pharmacy team  - his home Toprol was increased to 100mg  daily at the time of discharge  2. Status post mitral valve repair -2017 [place]. Flail segment. Most recent echocardiogram showed peak gradient of about 5 mmHg. No evidence of stenosis. --SBE prophylaxis. -TEE showed no thrombus on atrial appendage clip. MV with gradient of 3.7 mmHg.   3.  Chronic kidney disease stage III -Ranging from 1.2-1.5. Has been as high as 2.0 in 2017. Avoid NSAIDs. No dose adjustment on Eliquis  4. Morbid obesity -Continue to encourage weight loss.  5. Possible OSA: developed significant snoring with sedation for TEE. Will need outpatient sleep study.  _____________  Discharge Vitals Blood pressure (!) 109/94, pulse 81, temperature 98.1 F (36.7 C), resp. rate 20, height 5\' 10"  (1.778 m), weight (!) 156.9 kg, SpO2 97 %.  Filed Weights   03/30/20 1237 03/31/20 1410 04/01/20 0543  Weight: (!) 161 kg (!) 159.6 kg (!) 156.9 kg    Labs & Radiologic Studies    CBC Recent Labs    03/30/20 1331  WBC 9.1  HGB 15.8  HCT 48.7  MCV 97.6  PLT 123456   Basic Metabolic Panel Recent Labs    03/30/20 1331 03/31/20 0554  NA 141 139  K 4.7 4.3  CL 105 106  CO2 23 25  GLUCOSE 99 129*  BUN 21* 17  CREATININE 1.52* 1.45*  CALCIUM 8.8* 8.8*  MG 2.0  --    Liver Function Tests No results for input(s): AST, ALT, ALKPHOS, BILITOT, PROT, ALBUMIN in the last 72 hours. No results for input(s): LIPASE, AMYLASE in the last 72 hours. Cardiac Enzymes No results for input(s): CKTOTAL, CKMB, CKMBINDEX, TROPONINI in the last 72 hours. BNP Invalid input(s): POCBNP D-Dimer No results for input(s): DDIMER in the last 72 hours. Hemoglobin A1C Recent Labs    03/30/20 1331  HGBA1C 6.1*   Fasting Lipid Panel No results for input(s): CHOL, HDL, LDLCALC, TRIG, CHOLHDL, LDLDIRECT in the last 72 hours. Thyroid Function Tests Recent Labs    03/30/20 1842  TSH 1.459   _____________  DG Chest Portable 1 View  Result Date: 03/30/2020 CLINICAL DATA:  Short of breath.  Palpitations. EXAM: PORTABLE CHEST 1 VIEW COMPARISON:  04/05/2016. FINDINGS: Cardiac silhouette is mildly enlarged. There are changes from previous valve replacement, stable. No mediastinal or hilar masses. Mild hazy opacity is noted in the right mid to lower lung with associated linear  opacities. Several surgical vascular clips project over the right heart border. Subtle pulmonary anastomosis staple line noted in the inferior right lung. Right upper lung is clear. Left lung is clear. No convincing pleural effusion and no pneumothorax. Skeletal structures are grossly intact. IMPRESSION: 1. No acute cardiopulmonary disease. 2. Chronic lower lung surgical changes on the right. Electronically Signed   By: Lajean Manes M.D.   On: 03/30/2020 12:56   ECHO TEE  Result Date: 04/01/2020    TRANSESOPHOGEAL ECHO REPORT   Patient Name:   Henry [facility]herty Date of Exam: 04/01/2020 Medical Rec #:  [id number]    Height:       70.0 in Accession #:    [id number]   Weight:       345.8 lb Date of Birth:  11-08-1961   BSA:          2.634 m Patient Age:    80 years     BP:           136/87 mmHg Patient Gender: M            HR:           136 bpm. Exam Location:  Inpatient Procedure: 2D Echo Indications:    cardioversion  History:        Patient has prior history of Echocardiogram examinations, most                 recent 12/20/2019. CHF, Arrythmias:Atrial Fibrillation; Risk                 Factors:Hypertension.                  Mitral Valve: prosthetic annuloplasty ring valve is present in                 the mitral position.  Sonographer:    Jannett Celestine RDCS (AE) Referring Phys: [id number] Darreld Mclean  Sonographer Comments: Image acquisition challenging due to respiratory motion. PROCEDURE: After discussion of the risks and benefits of a TEE, an informed consent was obtained from the patient. TEE procedure time was 20 minutes. The transesophogeal probe was passed without difficulty through the esophogus of the patient. Imaged were obtained with the patient in a left lateral decubitus position. Local oropharyngeal anesthetic was provided with viscous lidocaine. Sedation performed by different physician. The patient was monitored while under deep sedation. Anesthestetic sedation  was provided intravenously by  Anesthesiology: 415mg  of Propofol, 100mg  of Lidocaine. Image quality was excellent. The patient's vital signs; including heart rate, blood pressure, and oxygen saturation; remained stable throughout the procedure. The patient developed no complications during the procedure. A successful direct current cardioversion was performed at 200 joules with 1 attempt. There are concerns for sleep apnea. Would recommend Sleep study. IMPRESSIONS  1. EF mildly reduced 45-50%; recommend repeat TTE after cardioversion. S/p MV repair and LAA clipping. The LAA is clipped without thrombus in the remaining segment. Successful DCCV with return to NSR. The patient developed profound hypoxia with snoring while sedated. Would recommend testing for sleep apnea.  2. Left ventricular ejection fraction, by estimation, is 45 to 50%. The left ventricle has mildly decreased function. The left ventricle demonstrates global hypokinesis.  3. Right ventricular systolic function was not well visualized. The right ventricular size is not well visualized.  4. A left atrial appendage clip is present. There is no apparent opening or thrombus. No left atrial/left atrial appendage thrombus was detected.  5. The mitral valve has been repaired/replaced. Mild mitral valve regurgitation. No evidence of mitral stenosis. The mean mitral valve gradient is 3.7 mmHg with average heart rate of 124 bpm. There is a prosthetic annuloplasty ring present in the mitral  position.  6. The aortic valve is tricuspid. Aortic valve regurgitation is trivial. No aortic stenosis is present.  7. There is mild (Grade II) layered plaque involving the descending aorta.  8. There are concerns for sleep apnea. Would recommend Sleep study. FINDINGS  Left Ventricle: Left ventricular ejection fraction, by estimation, is 45 to 50%. The left ventricle has mildly decreased function. The left ventricle demonstrates global hypokinesis. The left ventricular internal cavity size was normal in  size. There is  no left ventricular hypertrophy. Right Ventricle: The right ventricular size is not well visualized. Right vetricular wall thickness was not assessed. Right ventricular systolic function was not well visualized. Left Atrium: A left atrial appendage clip is present. There is no apparent opening or thrombus. Left atrial size was normal in size. No left atrial/left atrial appendage thrombus was detected. Right Atrium: Right atrial size was normal in size. Pericardium: There is no evidence of pericardial effusion. Mitral Valve: The mitral valve has been repaired/replaced. Mild mitral valve regurgitation. There is a prosthetic annuloplasty ring present in the mitral position. No evidence of mitral valve stenosis. The mean mitral valve gradient is 3.7 mmHg with average heart rate of 124 bpm. Tricuspid Valve: The tricuspid valve is grossly normal. Tricuspid valve regurgitation is mild . No evidence of tricuspid stenosis. Aortic Valve: The aortic valve is tricuspid. Aortic valve regurgitation is trivial. No aortic stenosis is present. Pulmonic Valve: The pulmonic valve was grossly normal. Pulmonic valve regurgitation is not visualized. No evidence of pulmonic stenosis. Aorta: The aortic root is normal in size and structure. There is mild (Grade II) layered plaque involving the descending aorta. Venous: The left upper pulmonary vein is normal. IAS/Shunts: No atrial level shunt detected by color flow Doppler. There is no evidence of a patent foramen ovale.  MITRAL VALVE MV Mean grad: 3.7 mmHg Eleonore Chiquito MD Electronically signed by Eleonore Chiquito MD Signature Date/Time: 04/01/2020/2:42:34 PM    Final    Disposition   Pt is being discharged home today in good condition.  Follow-up Plans & Appointments    Follow-up Information    [name], NP Follow up on 04/09/2020.   Specialties: Cardiology, Radiology Why: at 2:15pm  for your follow up appt. Contact information: [place] [address] [phone]          Discharge Instructions    Diet - low sodium heart healthy   Complete by: As directed    Increase activity slowly   Complete by: As directed       Discharge Medications   Allergies as of 04/01/2020      Reactions   Amiodarone    Blisters and whelps to IV FORM.   Cortisone    Glaucoma HX ,  Triggers increased pressure in eye      Medication List    STOP taking these medications   aspirin EC 81 MG tablet     TAKE these medications   apixaban 5 MG Tabs tablet Commonly known as: ELIQUIS Take 1 tablet (5 mg total) by mouth 2 (two) times daily.   metoprolol succinate 100 MG 24 hr tablet Commonly known as: TOPROL-XL Take 1 tablet (100 mg total) by mouth daily. Take with or immediately following a meal. Start taking on: Apr 02, 2020 What changed:   medication strength  how much to take  additional instructions   tamsulosin 0.4 MG Caps capsule Commonly known as: FLOMAX Take 0.4 mg by mouth daily.   torsemide 10 MG tablet Commonly known as: DEMADEX Take 1 tablet (10 mg total) by mouth daily.   valsartan 320 MG tablet Commonly known as: DIOVAN Take 1 tablet (320 mg total) by mouth daily.         Outstanding Labs/Studies   Outpatient sleep study  Duration of Discharge Encounter   Greater than 30 minutes including physician time.  Signed, Reino Bellis NP-C 04/01/2020, 4:57 PM  Personally seen and examined. Agree with above.   Feels well post conversion.  Maintaining sinus rhythm.  GEN: Well nourished, well developed, in no acute distress, overweight HEENT: normal  Neck: no JVD, carotid bruits, or masses Cardiac: RRR; no murmurs, rubs, or gallops,no edema  Respiratory:  clear to auscultation bilaterally, normal work of breathing GI: soft, nontender, nondistended, + BS MS: no deformity or atrophy  Skin: warm and dry, no rash Neuro:  Alert and Oriented x 3, Strength and sensation are intact Psych:  euthymic mood, full affect  Atypical atrial flutter paroxysmal -2-1 conduction.  TEE cardioversion successful.   -Increased Toprol to 100 mg a day.  Chronic anticoagulation -Continue with Eliquis.  Valve repair -Mitral valve 2017 [place].  SBE prophylaxis.  No significant gradient.  Left atrial clipping -Continue with Eliquis.  Chronic kidney disease stage IIIb -Avoid NSAIDs.  Morbid obesity -Continue with weight loss  Suspected sleep apnea -Noted during anesthesia for TEE cardioversion.  Significant obstruction, snoring, required oral airway.  Will help facilitate sleep study.  Candee Furbish, MD

## 2020-04-01 NOTE — Progress Notes (Signed)
   04/01/20 0708  Vitals  ECG Heart Rate (!) 131  Cardiac Rhythm ST  Ectopy Unifocal PVC's  Ectopy Frequency Frequent  Level of Consciousness  Level of Consciousness Alert  Pain Assessment  Pain Scale 0-10  Pain Score 0  ECG Intervals  PR interval 0.13  QRS interval 0.11  QT interval 0.33  QTc interval 0.48  Glasgow Coma Scale  Eye Opening 4  Best Verbal Response (NON-intubated) 5  Modified Verbal Response (INTUBATED) 5  Best Motor Response 6  Glasgow Coma Scale Score (!) 20  MEWS Score  MEWS Temp 0  MEWS Systolic 0  MEWS Pulse 3  MEWS RR 0  MEWS LOC 0  MEWS Score 3  MEWS Score Color Yellow  Provider Notification  Provider Name/Title Dr Derl Barrow  Date Provider Notified 04/01/20  Time Provider Notified [phone]  Notification Type Page  Notification Reason Other (Comment) (Triggered Yellow Mews due to chronic sustained HR 130-140.)  Response No new orders  Date of Provider Response 04/01/20  Time of Provider Response 0833  Rapid Response Notification  Name of Rapid Response RN Notified  (Not done chronic condition MD aware No change in tx plan.)  Note  Observations  (Chronic elevation HR cont with cardizem Gtt,for TEE 5/19.)

## 2020-04-01 NOTE — Progress Notes (Signed)
Patient ambulated up and down hallway independantly with wife at side no problem,o2 sat held as well as heart rate in the 70-80's while off the cardizem gtt. MD in see patient reported okay to discontinue cardizem gtt would discharge patient home today. Vs stable.

## 2020-04-01 NOTE — Progress Notes (Signed)
Patient taken down in wheelchair for TEE/cardioversion has left the floor for procedure.

## 2020-04-01 NOTE — CV Procedure (Signed)
   TRANSESOPHAGEAL ECHOCARDIOGRAM GUIDED DIRECT CURRENT CARDIOVERSION  NAME:  Henry [facility]    MRN: [id number] DOB:  Oct 14, 1961    ADMIT DATE: 03/30/2020  INDICATIONS: Symptomatic atrial flutter  PROCEDURE:   Informed consent was obtained prior to the procedure. The risks, benefits and alternatives for the procedure were discussed and the patient comprehended these risks.  Risks include, but are not limited to, cough, sore throat, vomiting, nausea, somnolence, esophageal and stomach trauma or perforation, bleeding, low blood pressure, aspiration, pneumonia, infection, trauma to the teeth and death.    After a procedural time-out, the oropharynx was anesthetized and the patient was sedated by the anesthesia service. The transesophageal probe was inserted in the esophagus and stomach without difficulty and multiple views were obtained. Anesthesia was monitored by Dr. Ola Spurr. The patient received 100 mg lidocaine and 415 mg propofol.   COMPLICATIONS:    Complications: No complications Patient tolerated procedure well.  KEY FINDINGS:  1. S/p MV repair.  2. S/p laa clipping. No LAA thrombus.  3. LVEF 60-65%.  4. Severe hypoxia with anesthesia noted. Recommend sleep study.  5. Full Report to follow.   CARDIOVERSION:     Indications:  Symptomatic Atrial Flutter  Procedure Details:  Once the TEE was complete, the patient had the defibrillator pads placed in the anterior and posterior position. Once an appropriate level of sedation was confirmed, the patient was cardioverted x 1 with 200J of biphasic synchronized energy.  The patient converted to NSR.  There were no apparent complications.  The patient had normal neuro status and respiratory status post procedure with vitals stable as recorded elsewhere.  Adequate airway was maintained throughout and vital signs monitored per protocol.  [place] T. Audie Box, [place]  [address], [place], [place]  [address] [phone]  1:22 PM

## 2020-04-01 NOTE — TOC Progression Note (Signed)
Transition of Care [facility]) - Progression Note    Patient Details  Name: Henry [facility] MRN: [id number] Date of Birth: 04/05/1961  Transition of Care [facility]) CM/SW Contact  Zenon Mayo, RN Phone Number: 04/01/2020, 4:54 PM  Clinical Narrative:    NCM gave patient the 30 day free eliquis coupon and information regarding his refills and prior auth needed.          Expected Discharge Plan and Services                                                 Social Determinants of Health (SDOH) Interventions    Readmission Risk Interventions No flowsheet data found.

## 2020-04-01 NOTE — Progress Notes (Signed)
Chronic elevation of HR noted 130-140's MD aware patient NPO after MN for TEE procedure today. Conts with cardizem GTT no change in tx plan this time. Cont vs q hr and observe patient for any further changes on tele or any change in his condition as he remains asymtomatic of heart rate elevation.

## 2020-04-01 NOTE — Anesthesia Preprocedure Evaluation (Addendum)
Anesthesia Evaluation  Patient identified by MRN, date of birth, ID band Patient awake    Reviewed: Allergy & Precautions, H&P , NPO status , Patient's Chart, lab work & pertinent test results, reviewed documented beta blocker date and time   Airway Mallampati: III  TM Distance: >3 FB Neck ROM: Full    Dental no notable dental hx. (+) Teeth Intact, Dental Advisory Given   Pulmonary neg pulmonary ROS,    Pulmonary exam normal breath sounds clear to auscultation       Cardiovascular hypertension, Pt. on medications and Pt. on home beta blockers +CHF and + DOE  + dysrhythmias Atrial Fibrillation  Rhythm:Irregular Rate:Tachycardia     Neuro/Psych negative neurological ROS  negative psych ROS   GI/Hepatic negative GI ROS, Neg liver ROS,   Endo/Other  Morbid obesity  Renal/GU Renal InsufficiencyRenal disease  negative genitourinary   Musculoskeletal  (+) Arthritis , Osteoarthritis,    Abdominal   Peds  Hematology negative hematology ROS (+)   Anesthesia Other Findings   Reproductive/Obstetrics negative OB ROS                            Anesthesia Physical Anesthesia Plan  ASA: III  Anesthesia Plan: General   Post-op Pain Management:    Induction: Intravenous  PONV Risk Score and Plan: 2 and Propofol infusion and Treatment may vary due to age or medical condition  Airway Management Planned: Nasal Cannula  Additional Equipment:   Intra-op Plan:   Post-operative Plan:   Informed Consent: I have reviewed the patients History and Physical, chart, labs and discussed the procedure including the risks, benefits and alternatives for the proposed anesthesia with the patient or authorized representative who has indicated his/her understanding and acceptance.     Dental advisory given  Plan Discussed with: CRNA  Anesthesia Plan Comments:         Anesthesia Quick Evaluation

## 2020-04-01 NOTE — Interval H&P Note (Signed)
History and Physical Interval Note:  04/01/2020 12:54 PM  Henry [facility]  has presented today for surgery, with the diagnosis of afib.  The various methods of treatment have been discussed with the patient and family. After consideration of risks, benefits and other options for treatment, the patient has consented to  Procedure(s): TRANSESOPHAGEAL ECHOCARDIOGRAM (TEE) (N/A) CARDIOVERSION (N/A) as a surgical intervention.  The patient's history has been reviewed, patient examined, no change in status, stable for surgery.  I have reviewed the patient's chart and labs.  Questions were answered to the patient's satisfaction.    TEE/DCCV. History of MV repair. LAA clip. On eliquis. NPO  [place] Bells T. Audie Box, [place]  [address], [place], [place] [address] [phone]  12:55 PM

## 2020-04-01 NOTE — Progress Notes (Signed)
No change in status noted conts with cardizem gtt for heart rate elevation asytomatic of heart rate elevation no change in tx plan this time.   04/01/20 0741  Assess: MEWS Score  Temp 97.8 F (36.6 C)  BP (!) 120/91  Pulse Rate (!) 132  Resp 20  Level of Consciousness Alert  SpO2 97 %  O2 Device Room Air  Assess: MEWS Score  MEWS Temp 0  MEWS Systolic 0  MEWS Pulse 3  MEWS RR 0  MEWS LOC 0  MEWS Score 3  MEWS Score Color Yellow  Assess: if the MEWS score is Yellow or Red  Were vital signs taken at a resting state? Yes  Focused Assessment Documented focused assessment  Early Detection of Sepsis Score *See Row Information* Low  MEWS guidelines implemented *See Row Information* No, previously yellow, continue vital signs every 4 hours  Treat  MEWS Interventions Other (Comment)  Escalate  MEWS: Escalate Yellow: discuss with charge nurse/RN and consider discussing with provider and RRT  Notify: Charge Nurse/RN  Name of Charge Nurse/RN Notified Shuayb, [place]  Date Charge Nurse/RN Notified 04/01/20  Time Charge Nurse/RN Notified 0830  Notify: Provider  Provider Name/Title Dr.M Marlou Porch (Dr.Skains)  Date Provider Notified 04/01/20  Time Provider Notified [phone]  Notification Type Page  Notification Reason Other (Comment) (Triggered yellow mews due to HR elevation)  Response No new orders  Date of Provider Response 04/01/20  Time of Provider Response [phone]  Notify: Rapid Response  Name of Rapid Response RN Notified  (not notified is chronic condition with no orders change tx)  Document  Patient Outcome  (No change in status noted.)  Progress note created (see row info) Yes

## 2020-04-01 NOTE — Plan of Care (Signed)

## 2020-04-01 NOTE — Progress Notes (Signed)
Patient triggered yellow MEWS while down in procedural area having cardioversion. He has been [place] since he has returned to the floor.

## 2020-04-01 NOTE — Progress Notes (Signed)
Progress Note  Patient Name: Henry Dougherty Date of Encounter: 04/01/2020  Primary Cardiologist: Candee Furbish, MD   Subjective   Feels well, no SOB, no CP, wife and daughter in room  Inpatient Medications    Scheduled Meds: . apixaban  5 mg Oral BID  . irbesartan  300 mg Oral Daily  . metoprolol succinate  50 mg Oral Daily  . tamsulosin  0.4 mg Oral Daily  . torsemide  10 mg Oral Daily   Continuous Infusions: . sodium chloride 20 mL/hr at 04/01/20 0804  . diltiazem (CARDIZEM) infusion 15 mg/hr (04/01/20 0653)   PRN Meds: acetaminophen, nitroGLYCERIN, ondansetron (ZOFRAN) IV   Vital Signs    Vitals:   04/01/20 0541 04/01/20 0543 04/01/20 0741 04/01/20 1129  BP: 122/73  (!) 120/91 109/65  Pulse: (!) 130  (!) 132 (!) 133  Resp: 17  20 20   Temp: 98.2 F (36.8 C)  97.8 F (36.6 C) 97.8 F (36.6 C)  TempSrc: Oral  Oral Oral  SpO2: 97%  97% 96%  Weight:  (!) 156.9 kg    Height:        Intake/Output Summary (Last 24 hours) at 04/01/2020 1153 Last data filed at 04/01/2020 0653 Gross per 24 hour  Intake 1122.99 ml  Output 1260 ml  Net -137.01 ml   Last 3 Weights 04/01/2020 03/31/2020 03/30/2020  Weight (lbs) 345 lb 12.8 oz 351 lb 13.7 oz 355 lb  Weight (kg) 156.854 kg 159.6 kg 161.027 kg      Telemetry    AFLUTTER 134 - Personally Reviewed  ECG    AFLUTTER - Personally Reviewed  Physical Exam   GEN: No acute distress.  Overweight Neck: No JVD Cardiac: Tachy reg, no murmurs, rubs, or gallops.  Respiratory: Clear to auscultation bilaterally. GI: Soft, nontender, non-distended  MS: No edema; No deformity. Neuro:  Nonfocal  Psych: Normal affect   Labs    High Sensitivity Troponin:  No results for input(s): TROPONINIHS in the last 720 hours.    Chemistry Recent Labs  Lab 03/30/20 1331 03/31/20 0554  NA 141 139  K 4.7 4.3  CL 105 106  CO2 23 25  GLUCOSE 99 129*  BUN 21* 17  CREATININE 1.52* 1.45*  CALCIUM 8.8* 8.8*  GFRNONAA 49* 52*  GFRAA 57*  >60  ANIONGAP 13 8     Hematology Recent Labs  Lab 03/30/20 1331  WBC 9.1  RBC 4.99  HGB 15.8  HCT 48.7  MCV 97.6  MCH 31.7  MCHC 32.4  RDW 13.0  PLT 280    BNPNo results for input(s): BNP, PROBNP in the last 168 hours.   DDimer No results for input(s): DDIMER in the last 168 hours.   Radiology    DG Chest Portable 1 View  Result Date: 03/30/2020 CLINICAL DATA:  Short of breath.  Palpitations. EXAM: PORTABLE CHEST 1 VIEW COMPARISON:  04/05/2016. FINDINGS: Cardiac silhouette is mildly enlarged. There are changes from previous valve replacement, stable. No mediastinal or hilar masses. Mild hazy opacity is noted in the right mid to lower lung with associated linear opacities. Several surgical vascular clips project over the right heart border. Subtle pulmonary anastomosis staple line noted in the inferior right lung. Right upper lung is clear. Left lung is clear. No convincing pleural effusion and no pneumothorax. Skeletal structures are grossly intact. IMPRESSION: 1. No acute cardiopulmonary disease. 2. Chronic lower lung surgical changes on the right. Electronically Signed   By: Dedra Skeens.D.  On: 03/30/2020 12:56    Cardiac Studies   TEE CV P.   Echocardiogram 12/20/2019: Impressions: 1. Left ventricular ejection fraction, by visual estimation, is 50 to  55%. The left ventricle has normal function. Left ventricular septal wall  thickness was normal. Normal left ventricular posterior wall thickness.  There is no left ventricular  hypertrophy.  2. Definity contrast agent was given IV to delineate the left ventricular  endocardial borders.  3. Left ventricular diastolic function could not be evaluated.  4. The left ventricle demonstrates regional wall motion abnormalities.  5. Septal hypokineiss. Compared with echo 123456, systolic function has  improved.  6. Global right ventricle has normal systolic function.The right  ventricular size is normal. No increase in  right ventricular wall  thickness.  7. Left atrial size was normal.  8. Right atrial size was normal.  9. The mitral valve has been repaired/replaced. Trivial mitral valve  regurgitation. No evidence of mitral stenosis.  10. The tricuspid valve is normal in structure.  11. The tricuspid valve is normal in structure. Tricuspid valve  regurgitation is mild.  12. The aortic valve is normal in structure. Aortic valve regurgitation is  not visualized. No evidence of aortic valve sclerosis or stenosis.  13. The pulmonic valve was normal in structure. Pulmonic valve  regurgitation is not visualized.  14. Normal pulmonary artery systolic pressure.  15. The inferior vena cava is normal in size with greater than 50%  respiratory variability, suggesting right atrial pressure of 3 mmHg.  16. MV peak gradient, 5.6 mmHg.  Patient Profile     59 y.o. male with a history of mitral valve repair in 03/2016 in [place], atrial fibrillation s/p MAZE procedure as well as left atrial clipping and closure of PFO at time of MVR, chronic systolic CHF with improved EF of 50-55% on Echo in 12/2019, hypertension, and morbid obesity who was admitted to cardiology for the evaluation of atrial flutter with RVR   Assessment & Plan    Atrial flutter, 2:1, several days  - TEE CV today 1:30  - On Eliquis, discussed with pharmacy team  - If successful, likely DC later today  - Appreciate ENDO and ANESTHESIA team getting him scheduled for cardioversion.   Status post mitral valve repair -2017 [place].  Flail segment.  Most recent echocardiogram showed peak gradient of about 5 mmHg.  No evidence of stenosis.  SBE prophylaxis. -I performed last TEE, no veg at the time.  Chronic kidney disease stage III -Ranging from 1.2-1.5.  Has been as high as 2.0 in 2017.  Avoid NSAIDs. No dose adjustment on Eliquis  Morbid obesity -Continue to encourage weight loss.  For questions or updates, please contact [place] Please consult [email or web address] for contact info under        Signed, Candee Furbish, MD  04/01/2020, 11:53 AM

## 2020-04-01 NOTE — H&P (View-Only) (Signed)
Progress Note  Patient Name: Henry Dougherty Date of Encounter: 04/01/2020  Primary Cardiologist: Candee Furbish, MD   Subjective   Feels well, no SOB, no CP, wife and daughter in room  Inpatient Medications    Scheduled Meds: . apixaban  5 mg Oral BID  . irbesartan  300 mg Oral Daily  . metoprolol succinate  50 mg Oral Daily  . tamsulosin  0.4 mg Oral Daily  . torsemide  10 mg Oral Daily   Continuous Infusions: . sodium chloride 20 mL/hr at 04/01/20 0804  . diltiazem (CARDIZEM) infusion 15 mg/hr (04/01/20 0653)   PRN Meds: acetaminophen, nitroGLYCERIN, ondansetron (ZOFRAN) IV   Vital Signs    Vitals:   04/01/20 0541 04/01/20 0543 04/01/20 0741 04/01/20 1129  BP: 122/73  (!) 120/91 109/65  Pulse: (!) 130  (!) 132 (!) 133  Resp: 17  20 20   Temp: 98.2 F (36.8 C)  97.8 F (36.6 C) 97.8 F (36.6 C)  TempSrc: Oral  Oral Oral  SpO2: 97%  97% 96%  Weight:  (!) 156.9 kg    Height:        Intake/Output Summary (Last 24 hours) at 04/01/2020 1153 Last data filed at 04/01/2020 0653 Gross per 24 hour  Intake 1122.99 ml  Output 1260 ml  Net -137.01 ml   Last 3 Weights 04/01/2020 03/31/2020 03/30/2020  Weight (lbs) 345 lb 12.8 oz 351 lb 13.7 oz 355 lb  Weight (kg) 156.854 kg 159.6 kg 161.027 kg      Telemetry    AFLUTTER 134 - Personally Reviewed  ECG    AFLUTTER - Personally Reviewed  Physical Exam   GEN: No acute distress.  Overweight Neck: No JVD Cardiac: Tachy reg, no murmurs, rubs, or gallops.  Respiratory: Clear to auscultation bilaterally. GI: Soft, nontender, non-distended  MS: No edema; No deformity. Neuro:  Nonfocal  Psych: Normal affect   Labs    High Sensitivity Troponin:  No results for input(s): TROPONINIHS in the last 720 hours.    Chemistry Recent Labs  Lab 03/30/20 1331 03/31/20 0554  NA 141 139  K 4.7 4.3  CL 105 106  CO2 23 25  GLUCOSE 99 129*  BUN 21* 17  CREATININE 1.52* 1.45*  CALCIUM 8.8* 8.8*  GFRNONAA 49* 52*  GFRAA 57*  >60  ANIONGAP 13 8     Hematology Recent Labs  Lab 03/30/20 1331  WBC 9.1  RBC 4.99  HGB 15.8  HCT 48.7  MCV 97.6  MCH 31.7  MCHC 32.4  RDW 13.0  PLT 280    BNPNo results for input(s): BNP, PROBNP in the last 168 hours.   DDimer No results for input(s): DDIMER in the last 168 hours.   Radiology    DG Chest Portable 1 View  Result Date: 03/30/2020 CLINICAL DATA:  Short of breath.  Palpitations. EXAM: PORTABLE CHEST 1 VIEW COMPARISON:  04/05/2016. FINDINGS: Cardiac silhouette is mildly enlarged. There are changes from previous valve replacement, stable. No mediastinal or hilar masses. Mild hazy opacity is noted in the right mid to lower lung with associated linear opacities. Several surgical vascular clips project over the right heart border. Subtle pulmonary anastomosis staple line noted in the inferior right lung. Right upper lung is clear. Left lung is clear. No convincing pleural effusion and no pneumothorax. Skeletal structures are grossly intact. IMPRESSION: 1. No acute cardiopulmonary disease. 2. Chronic lower lung surgical changes on the right. Electronically Signed   By: Dedra Skeens.D.  On: 03/30/2020 12:56    Cardiac Studies   TEE CV P.   Echocardiogram 12/20/2019: Impressions: 1. Left ventricular ejection fraction, by visual estimation, is 50 to  55%. The left ventricle has normal function. Left ventricular septal wall  thickness was normal. Normal left ventricular posterior wall thickness.  There is no left ventricular  hypertrophy.  2. Definity contrast agent was given IV to delineate the left ventricular  endocardial borders.  3. Left ventricular diastolic function could not be evaluated.  4. The left ventricle demonstrates regional wall motion abnormalities.  5. Septal hypokineiss. Compared with echo 123456, systolic function has  improved.  6. Global right ventricle has normal systolic function.The right  ventricular size is normal. No increase in  right ventricular wall  thickness.  7. Left atrial size was normal.  8. Right atrial size was normal.  9. The mitral valve has been repaired/replaced. Trivial mitral valve  regurgitation. No evidence of mitral stenosis.  10. The tricuspid valve is normal in structure.  11. The tricuspid valve is normal in structure. Tricuspid valve  regurgitation is mild.  12. The aortic valve is normal in structure. Aortic valve regurgitation is  not visualized. No evidence of aortic valve sclerosis or stenosis.  13. The pulmonic valve was normal in structure. Pulmonic valve  regurgitation is not visualized.  14. Normal pulmonary artery systolic pressure.  15. The inferior vena cava is normal in size with greater than 50%  respiratory variability, suggesting right atrial pressure of 3 mmHg.  16. MV peak gradient, 5.6 mmHg.  Patient Profile     59 y.o. male with a history of mitral valve repair in 03/2016 in [place], atrial fibrillation s/p MAZE procedure as well as left atrial clipping and closure of PFO at time of MVR, chronic systolic CHF with improved EF of 50-55% on Echo in 12/2019, hypertension, and morbid obesity who was admitted to cardiology for the evaluation of atrial flutter with RVR   Assessment & Plan    Atrial flutter, 2:1, several days  - TEE CV today 1:30  - On Eliquis, discussed with pharmacy team  - If successful, likely DC later today  - Appreciate ENDO and ANESTHESIA team getting him scheduled for cardioversion.   Status post mitral valve repair -2017 [place].  Flail segment.  Most recent echocardiogram showed peak gradient of about 5 mmHg.  No evidence of stenosis.  SBE prophylaxis. -I performed last TEE, no veg at the time.  Chronic kidney disease stage III -Ranging from 1.2-1.5.  Has been as high as 2.0 in 2017.  Avoid NSAIDs. No dose adjustment on Eliquis  Morbid obesity -Continue to encourage weight loss.  For questions or updates, please contact [place] Please consult [email or web address] for contact info under        Signed, Candee Furbish, MD  04/01/2020, 11:53 AM

## 2020-04-01 NOTE — Progress Notes (Signed)
Asymtomatic of HR elevation no new orders noted for procedure today,TEE. No change in orders noted.   04/01/20 1129  Assess: MEWS Score  Temp 97.8 F (36.6 C)  BP 109/65  Pulse Rate (!) 133  Resp 20  SpO2 96 %  O2 Device Room Air  Assess: MEWS Score  MEWS Temp 0  MEWS Systolic 0  MEWS Pulse 3  MEWS RR 0  MEWS LOC 0  MEWS Score 3  MEWS Score Color Yellow  Assess: if the MEWS score is Yellow or Red  Were vital signs taken at a resting state? Yes  Focused Assessment Documented focused assessment  Early Detection of Sepsis Score *See Row Information* Low  MEWS guidelines implemented *See Row Information* No, previously yellow, continue vital signs every 4 hours  Notify: Charge Nurse/RN  Name of Charge Nurse/RN Notified burdett, tin  Date Charge Nurse/RN Notified 04/01/20  Time Charge Nurse/RN Notified 1149  Notify: Provider  Provider Name/Title Dr.Skains  Date Provider Notified 04/01/20  Time Provider Notified 1151  Notification Type  (messaged MD)  Notification Reason  (triggered yellow mews due to HR)  Response No new orders  Date of Provider Response 04/01/20  Time of Provider Response 1151  Notify: Rapid Response  Name of Rapid Response RN Notified  (not notified due to chronic condition)  Document  Patient Outcome Other (Comment)  Progress note created (see row info)  (stable symtomatic of HR no new orders noted.)

## 2020-04-01 NOTE — Transfer of Care (Signed)
Immediate Anesthesia Transfer of Care Note  Patient: Henry Dougherty  Procedure(s) Performed: TRANSESOPHAGEAL ECHOCARDIOGRAM (TEE) (N/A ) CARDIOVERSION (N/A )  Patient Location: PACU and Endoscopy Unit  Anesthesia Type:MAC  Level of Consciousness: awake, alert , oriented, patient cooperative and responds to stimulation  Airway & Oxygen Therapy: Patient Spontanous Breathing  Post-op Assessment: Report given to RN and Post -op Vital signs reviewed and stable  Post vital signs: Reviewed and stable  Last Vitals:  Vitals Value Taken Time  BP 153/122 04/01/20 1340  Temp 37.2 C 04/01/20 1330  Pulse 81 04/01/20 1342  Resp 15 04/01/20 1342  SpO2 95 % 04/01/20 1342  Vitals shown include unvalidated device data.  Last Pain:  Vitals:   04/01/20 1340  TempSrc:   PainSc: 0-No pain         Complications: No apparent anesthesia complications

## 2020-04-01 NOTE — Progress Notes (Signed)
  Echocardiogram 2D Echocardiogram has been performed.  Jannett Celestine 04/01/2020, 1:43 PM

## 2020-04-01 NOTE — Anesthesia Postprocedure Evaluation (Signed)
Anesthesia Post Note  Patient: Henry Dougherty  Procedure(s) Performed: TRANSESOPHAGEAL ECHOCARDIOGRAM (TEE) (N/A ) CARDIOVERSION (N/A )     Patient location during evaluation: Endoscopy Anesthesia Type: MAC Level of consciousness: awake and alert Pain management: pain level controlled Vital Signs Assessment: post-procedure vital signs reviewed and stable Respiratory status: spontaneous breathing, nonlabored ventilation and respiratory function stable Cardiovascular status: blood pressure returned to baseline and stable Postop Assessment: no apparent nausea or vomiting Anesthetic complications: no    Last Vitals:  Vitals:   04/01/20 1340 04/01/20 1350  BP: (!) 153/122 (!) 169/128  Pulse: 79 80  Resp: 18 16  Temp:    SpO2: 95% 94%    Last Pain:  Vitals:   04/01/20 1350  TempSrc:   PainSc: 0-No pain                 Lynda [place]

## 2020-04-01 NOTE — Plan of Care (Signed)
  Problem: Education: Goal: Knowledge of General Education information will improve Description: Including pain rating scale, medication(s)/side effects and non-pharmacologic comfort measures 04/01/2020 1923 by Arlina Robes, RN Outcome: Adequate for Discharge 04/01/2020 0900 by Arlina Robes, RN Outcome: Progressing   Problem: Health Behavior/Discharge Planning: Goal: Ability to manage health-related needs will improve 04/01/2020 1923 by Arlina Robes, RN Outcome: Adequate for Discharge 04/01/2020 0900 by Arlina Robes, RN Outcome: Progressing   Problem: Clinical Measurements: Goal: Ability to maintain clinical measurements within normal limits will improve 04/01/2020 1923 by Arlina Robes, RN Outcome: Adequate for Discharge 04/01/2020 0900 by Arlina Robes, RN Outcome: Progressing Goal: Will remain free from infection 04/01/2020 1923 by Arlina Robes, RN Outcome: Adequate for Discharge 04/01/2020 0900 by Arlina Robes, RN Outcome: Progressing Goal: Diagnostic test results will improve 04/01/2020 1923 by Arlina Robes, RN Outcome: Adequate for Discharge 04/01/2020 0900 by Arlina Robes, RN Outcome: Progressing Goal: Respiratory complications will improve 04/01/2020 1923 by Arlina Robes, RN Outcome: Adequate for Discharge 04/01/2020 0900 by Arlina Robes, RN Outcome: Progressing Goal: Cardiovascular complication will be avoided 04/01/2020 1923 by Arlina Robes, RN Outcome: Adequate for Discharge 04/01/2020 0900 by Arlina Robes, RN Outcome: Progressing   Problem: Activity: Goal: Risk for activity intolerance will decrease 04/01/2020 1923 by Arlina Robes, RN Outcome: Adequate for Discharge 04/01/2020 0900 by Arlina Robes, RN Outcome: Progressing   Problem: Nutrition: Goal: Adequate nutrition will be maintained 04/01/2020 1923 by Arlina Robes, RN Outcome: Adequate for Discharge 04/01/2020 0900 by  Arlina Robes, RN Outcome: Progressing   Problem: Coping: Goal: Level of anxiety will decrease 04/01/2020 1923 by Arlina Robes, RN Outcome: Adequate for Discharge 04/01/2020 0900 by Arlina Robes, RN Outcome: Progressing   Problem: Elimination: Goal: Will not experience complications related to bowel motility 04/01/2020 1923 by Arlina Robes, RN Outcome: Adequate for Discharge 04/01/2020 0900 by Arlina Robes, RN Outcome: Progressing Goal: Will not experience complications related to urinary retention 04/01/2020 1923 by Arlina Robes, RN Outcome: Adequate for Discharge 04/01/2020 0900 by Arlina Robes, RN Outcome: Progressing   Problem: Pain Managment: Goal: General experience of comfort will improve 04/01/2020 1923 by Arlina Robes, RN Outcome: Adequate for Discharge 04/01/2020 0900 by Arlina Robes, RN Outcome: Progressing   Problem: Safety: Goal: Ability to remain free from injury will improve 04/01/2020 1923 by Arlina Robes, RN Outcome: Adequate for Discharge 04/01/2020 0900 by Arlina Robes, RN Outcome: Progressing   Problem: Skin Integrity: Goal: Risk for impaired skin integrity will decrease 04/01/2020 1923 by Arlina Robes, RN Outcome: Adequate for Discharge 04/01/2020 0900 by Arlina Robes, RN Outcome: Progressing

## 2020-04-02 ENCOUNTER — Other Ambulatory Visit: Payer: Self-pay

## 2020-04-02 ENCOUNTER — Telehealth: Payer: Self-pay

## 2020-04-02 MED ORDER — APIXABAN 5 MG PO TABS: 5.0000 mg | ORAL_TABLET | Freq: Two times a day (BID) | ORAL | 6 refills | Status: DC

## 2020-04-02 NOTE — Telephone Encounter (Signed)
Pt last saw Dr Marlou Porch 12/23/19, last labs 03/31/20 Creat 1.45, age 59, weight 156.9kg, based on specified criteria pt is on appropriate dosage of Eliquis 5mg  BID.  Will refill rx.

## 2020-04-02 NOTE — Telephone Encounter (Signed)
I started a Eliquis PA through [employer]mymeds. Key: [id number]    [employer] [id number] [id number]

## 2020-04-06 NOTE — Telephone Encounter (Signed)
**Note De-Identified Henry Dougherty Obfuscation** Received another fax requesting that we do a Eliquis PA for this pt so I started another one through [facility] using Key: [facility]

## 2020-04-08 NOTE — Telephone Encounter (Signed)
**Note De-Identified Vasilia Dise Obfuscation** I called [employer] at [phone] (on hold for 38 mins) and s/w Janett Billow concerning a Eliquis PA form we received from them vis fax. Janett Billow and I completed this Eliquis PA over the phone but it does not look like an approval is expected as Janett Billow states that Pradaxa 110 mg or  75 mg BID is on the pts plan of covered medications.  She states that we will receive their determination by fax within 72 hours.

## 2020-04-09 ENCOUNTER — Encounter: Payer: Self-pay | Admitting: Cardiology

## 2020-04-09 ENCOUNTER — Other Ambulatory Visit: Payer: Self-pay

## 2020-04-09 ENCOUNTER — Ambulatory Visit (INDEPENDENT_AMBULATORY_CARE_PROVIDER_SITE_OTHER): Payer: No Typology Code available for payment source | Admitting: Cardiology

## 2020-04-09 VITALS — BP 124/64 | HR 69 | Ht 70.0 in | Wt 349.0 lb

## 2020-04-09 DIAGNOSIS — I428 Other cardiomyopathies: Secondary | ICD-10-CM

## 2020-04-09 DIAGNOSIS — Z7901 Long term (current) use of anticoagulants: Secondary | ICD-10-CM

## 2020-04-09 DIAGNOSIS — R0683 Snoring: Secondary | ICD-10-CM

## 2020-04-09 DIAGNOSIS — Z9889 Other specified postprocedural states: Secondary | ICD-10-CM

## 2020-04-09 DIAGNOSIS — I4892 Unspecified atrial flutter: Secondary | ICD-10-CM

## 2020-04-09 DIAGNOSIS — G4733 Obstructive sleep apnea (adult) (pediatric): Secondary | ICD-10-CM

## 2020-04-09 MED ORDER — METOPROLOL SUCCINATE ER 50 MG PO TB24
50.0000 mg | ORAL_TABLET | Freq: Every day | ORAL | 3 refills | Status: DC
Start: 2020-04-09 — End: 2021-02-18

## 2020-04-09 NOTE — Patient Instructions (Addendum)
Medication Instructions:  Your physician has recommended you make the following change in your medication:  1.  DECREASE the Toprol to 1/2 tablet daily = 50 mg.  I have sent in a new prescription to your [facility] for the new dose  *If you need a refill on your cardiac medications before your next appointment, please call your pharmacy*   Lab Work: TODAY:  BMET & CBC  If you have labs (blood work) drawn today and your tests are completely normal, you will receive your results only by: Marland Kitchen MyChart Message (if you have MyChart) OR . A paper copy in the mail If you have any lab test that is abnormal or we need to change your treatment, we will call you to review the results.   Testing/Procedures: Your physician has recommended that you have a sleep study. This test records several body functions during sleep, including: brain activity, eye movement, oxygen and carbon dioxide blood levels, heart rate and rhythm, breathing rate and rhythm, the flow of air through your mouth and nose, snoring, body muscle movements, and chest and belly movement.     Follow-Up: At [facility], you and your health needs are our priority.  As part of our continuing mission to provide you with exceptional heart care, we have created designated Provider Care Teams.  These Care Teams include your primary Cardiologist (physician) and Advanced Practice Providers (APPs -  Physician Assistants and Nurse Practitioners) who all work together to provide you with the care you need, when you need it.  We recommend signing up for the patient portal called "MyChart".  Sign up information is provided on this After Visit Summary.  MyChart is used to connect with patients for Virtual Visits (Telemedicine).  Patients are able to view lab/test results, encounter notes, upcoming appointments, etc.  Non-urgent messages can be sent to your provider as well.   To learn more about what you can do with MyChart, go to  [email or web address].    Your next appointment:   2-3  month(s)   07/09/2020 8:00  The format for your next appointment:   In Person  Provider:   You may see Candee Furbish, MD or one of the following Advanced Practice Providers on your designated Care Team:    Truitt Merle, NP  Cecilie Kicks, NP  Kathyrn Drown, NP    Other Instructions

## 2020-04-09 NOTE — Progress Notes (Signed)
Cardiology Office Note   Date:  04/09/2020   ID:  Henry Dougherty, DOB 15-Jun-1961, MRN [id number]  PCP:  Shon Baton, MD  Cardiologist:  Dr. Marlou Porch    Chief Complaint  Patient presents with  . Hospitalization Follow-up      History of Present Illness: Henry Dougherty is a 59 y.o. male who presents for post hospital for a flutter,     Past Medical History:  Diagnosis Date  . Allergic rhinitis   . Atrial fibrillation with RVR ([facility]) 03/2020  . CHF (congestive heart failure) (HCC)    EF 45%/ VALVULAR CM  . DOE (dyspnea on exertion)   . Dyspepsia   . Environmental allergies   . Hypertension   . OA (osteoarthritis)   . Obese   . Preglaucoma   . SOB (shortness of breath)   . Uveitis    AS A TEENAGER    Past Surgical History:  Procedure Laterality Date  . CARDIAC SURGERY    . CARDIOVERSION N/A 04/01/2020   Procedure: CARDIOVERSION;  Surgeon: Geralynn Rile, MD;  Location: [facility];  Service: Cardiovascular;  Laterality: N/A;  . COLONOSCOPY     PT WAS [place]- 2-3 [place] ARTHROSCOPY Right 2005  . MITRAL VALVE REPAIR    . SHOULDER ARTHROSCOPY Right 2005  . TEE WITHOUT CARDIOVERSION N/A 04/01/2016   Procedure: TRANSESOPHAGEAL ECHOCARDIOGRAM (TEE);  Surgeon: Jerline Pain, MD;  Location: [facility];  Service: Cardiovascular;  Laterality: N/A;  . TEE WITHOUT CARDIOVERSION N/A 04/01/2020   Procedure: TRANSESOPHAGEAL ECHOCARDIOGRAM (TEE);  Surgeon: Geralynn Rile, MD;  Location: [facility];  Service: Cardiovascular;  Laterality: N/A;     Current Outpatient Medications  Medication Sig Dispense Refill  . apixaban (ELIQUIS) 5 MG TABS tablet Take 1 tablet (5 mg total) by mouth 2 (two) times daily. 60 tablet 6  . tamsulosin (FLOMAX) 0.4 MG CAPS capsule Take 0.4 mg by mouth daily.    Marland Kitchen torsemide (DEMADEX) 10 MG tablet Take 1 tablet (10 mg total) by mouth daily. 90 tablet 3  . valsartan (DIOVAN) 320 MG tablet Take 1 tablet (320 mg total) by mouth  daily. 90 tablet 3  . metoprolol succinate (TOPROL-XL) 50 MG 24 hr tablet Take 1 tablet (50 mg total) by mouth daily. Take with or immediately following a meal. 90 tablet 3   No current facility-administered medications for this visit.    Allergies:   Amiodarone and Cortisone    Social History:  The patient  reports that he has never smoked. He has never used smokeless tobacco. He reports current alcohol use. He reports that he does not use drugs.   Family History:  The patient's family history includes Atrial fibrillation in his mother; Colon cancer in his father; Heart Problems in his father and mother; Heart attack in his maternal grandfather and paternal grandfather; Stroke in his paternal grandfather.    ROS:  General:no colds or fevers, no weight changes Skin:no rashes or ulcers HEENT:no blurred vision, no congestion CV:see HPI PUL:see HPI GI:no diarrhea constipation or melena, no indigestion GU:no hematuria, no dysuria MS:no joint pain, no claudication Neuro:no syncope, no lightheadedness Endo:no diabetes, no thyroid disease Wt Readings from Last 3 Encounters:  04/09/20 (!) 349 lb (158.3 kg)  04/01/20 (!) 345 lb 12.8 oz (156.9 kg)  12/23/19 (!) 357 lb (161.9 kg)     PHYSICAL EXAM: VS:  BP 124/64   Pulse 69   Ht 5\' 10"  (1.778 m)  Wt (!) 349 lb (158.3 kg)   SpO2 95%   BMI 50.08 kg/m  , BMI Body mass index is 50.08 kg/m. General:Pleasant affect, NAD Skin:Warm and dry, brisk capillary refill HEENT:normocephalic, sclera clear, mucus membranes moist Neck:supple, no JVD, no bruits  Heart:S1S2 RRR without murmur, gallup, rub or click Lungs:clear without rales, rhonchi, or wheezes [id number], non tender, + BS, do not palpate liver spleen or masses Ext:no lower ext edema, 2+ pedal pulses, 2+ radial pulses Neuro:alert and oriented, MAE, follows commands, + facial symmetry    EKG:  EKG is ordered today. The ekg ordered today demonstrates SR normal EKG no changes     Recent Labs: 03/30/2020: Hemoglobin 15.8; Magnesium 2.0; Platelets 280; TSH 1.459 03/31/2020: BUN 17; Creatinine, Ser 1.45; Potassium 4.3; Sodium 139    Lipid Panel No results found for: CHOL, TRIG, HDL, CHOLHDL, VLDL, LDLCALC, LDLDIRECT     Other studies Reviewed: Additional studies/ records that were reviewed today include: . Echo TEE: 04/01/20  1. EF mildly reduced 45-50%; recommend repeat TTE after cardioversion.  S/p MV repair and LAA clipping. The LAA is clipped without thrombus in the  remaining segment. Successful DCCV with return to NSR. The patient  developed profound hypoxia with snoring  while sedated. Would recommend testing for sleep apnea.  2. Left ventricular ejection fraction, by estimation, is 45 to 50%. The  left ventricle has mildly decreased function. The left ventricle  demonstrates global hypokinesis.  3. Right ventricular systolic function was not well visualized. The right  ventricular size is not well visualized.  4. A left atrial appendage clip is present. There is no apparent opening  or thrombus. No left atrial/left atrial appendage thrombus was detected.  5. The mitral valve has been repaired/replaced. Mild mitral valve  regurgitation. No evidence of mitral stenosis. The mean mitral valve  gradient is 3.7 mmHg with average heart rate of 124 bpm. There is a  prosthetic annuloplasty ring present in the mitral  position.  6. The aortic valve is tricuspid. Aortic valve regurgitation is trivial.  No aortic stenosis is present.  7. There is mild (Grade II) layered plaque involving the descending  aorta.  8. There are concerns for sleep apnea. Would recommend Sleep study.  _____________   ASSESSMENT AND PLAN:  1.  Atrial flutter despite MAZE and atrial appendage clipping now on anticoagulation eliquis, though may need pradaxa per insurance, no hx of bleeding.  Post TEE and remains in NSR.  He does not feel well on high dose of metoprolol BP  as low as [id number] systolic at times.  HR was difficult to control in a fib but now in SR HR 69, will decrease to 50 mg daily.  . (fatigue in past on BB) though if back in a fib he could take higher dose. Follow up with Dr. Marlou Porch in 2-3 months   2  anticoagulation see above.   3.  OSA noted with TEE will schedule sleep study.  4.  Obesity wt loss would be beneficial.  5.  Indigestion post spicy foods, will try pepcid.    6.  Hx MV repair and PFO closure ,, SBE with dental for prophylaxis , ( hx of ruptured MV cord done in [place].   7.  NICM EF with a fib to 45%.  Hope to return to 50-55% on next echo.   8  HTN controlled     Current medicines are reviewed with the patient today.  The patient Has no concerns regarding medicines.  The following changes have been made:  See above Labs/ tests ordered today include:see above  Disposition:   FU:  see above  Signed, Cecilie Kicks, NP  04/09/2020 3:16 PM    [facility] [address] [place], [place] Phone: [phone]; Fax: [phone]

## 2020-04-10 ENCOUNTER — Telehealth: Payer: Self-pay

## 2020-04-10 DIAGNOSIS — R7989 Other specified abnormal findings of blood chemistry: Secondary | ICD-10-CM

## 2020-04-10 LAB — BASIC METABOLIC PANEL
BUN/Creatinine Ratio: 18 (ref 10–24)
BUN: 31 mg/dL — ABNORMAL HIGH (ref 8–27)
CO2: 21 mmol/L (ref 20–29)
Calcium: 9.5 mg/dL (ref 8.6–10.2)
Chloride: 102 mmol/L (ref 96–106)
Creatinine, Ser: 1.7 mg/dL — ABNORMAL HIGH (ref 0.76–1.27)
GFR calc Af Amer: 50 mL/min/{1.73_m2} — ABNORMAL LOW (ref >59)
GFR calc non Af Amer: 43 mL/min/{1.73_m2} — ABNORMAL LOW (ref >59)
Glucose: 108 mg/dL — ABNORMAL HIGH (ref 65–99)
Potassium: 4.8 mmol/L (ref 3.5–5.2)
Sodium: 139 mmol/L (ref 134–144)

## 2020-04-10 LAB — CBC
Hematocrit: 49.1 % (ref 37.5–51.0)
Hemoglobin: 17.1 g/dL (ref 13.0–17.7)
MCH: 32.9 pg (ref 26.6–33.0)
MCHC: 34.8 g/dL (ref 31.5–35.7)
MCV: 94 fL (ref 79–97)
Platelets: 313 10*3/uL (ref 150–450)
RBC: 5.2 x10E6/uL (ref 4.14–5.80)
RDW: 12.6 % (ref 11.6–15.4)
WBC: 11.9 10*3/uL — ABNORMAL HIGH (ref 3.4–10.8)

## 2020-04-10 NOTE — Telephone Encounter (Signed)
Pt would qualify for Xarelto 20mg  daily - even with renal dysfunction, Xarelto dosing is based off of CrCl using actual body weight (his is CrCl 103).

## 2020-04-10 NOTE — Telephone Encounter (Signed)
Spoke with pt and reviewed results and recommendations.  Pt verbalized understanding and was in agreement with plan.

## 2020-04-10 NOTE — Telephone Encounter (Signed)
What dose of Xarelto would you recommend? Thanks Candee Furbish, MD

## 2020-04-10 NOTE — Telephone Encounter (Signed)
LMTCB

## 2020-04-10 NOTE — Telephone Encounter (Signed)
-----   Message from [name], NP sent at 04/10/2020 12:41 PM EDT ----- Hold torsemide for 3 days and recheck BMP on Tuesday next week.

## 2020-04-10 NOTE — Telephone Encounter (Signed)
Patient is returning call.  °

## 2020-04-10 NOTE — Telephone Encounter (Addendum)
**Note De-Identified [name] Obfuscation** Letter received from [place] stating that they have denied the pts Eliquis PA.  Reason: Pt must first try and fail Pradaxa and Xarelto prior to taking Eliquis as they are on the pts list of preferred medications.  PA reference#: [id number]  Will forward this note to Dr Marlou Porch and his nurse for advisement to the pt.

## 2020-04-10 NOTE — Addendum Note (Signed)
Addended by: Loren Racer on: 04/10/2020 02:56 PM   Modules accepted: Orders

## 2020-04-14 ENCOUNTER — Other Ambulatory Visit: Payer: No Typology Code available for payment source | Admitting: *Deleted

## 2020-04-14 ENCOUNTER — Telehealth: Payer: Self-pay | Admitting: *Deleted

## 2020-04-14 ENCOUNTER — Other Ambulatory Visit: Payer: Self-pay

## 2020-04-14 DIAGNOSIS — R7989 Other specified abnormal findings of blood chemistry: Secondary | ICD-10-CM

## 2020-04-14 LAB — BASIC METABOLIC PANEL
BUN/Creatinine Ratio: 14 (ref 10–24)
BUN: 19 mg/dL (ref 8–27)
CO2: 20 mmol/L (ref 20–29)
Calcium: 10.2 mg/dL (ref 8.6–10.2)
Chloride: 103 mmol/L (ref 96–106)
Creatinine, Ser: 1.34 mg/dL — ABNORMAL HIGH (ref 0.76–1.27)
GFR calc Af Amer: 66 mL/min/{1.73_m2} (ref >59)
GFR calc non Af Amer: 57 mL/min/{1.73_m2} — ABNORMAL LOW (ref >59)
Glucose: 148 mg/dL — ABNORMAL HIGH (ref 65–99)
Potassium: 5.3 mmol/L — ABNORMAL HIGH (ref 3.5–5.2)
Sodium: 138 mmol/L (ref 134–144)

## 2020-04-14 MED ORDER — RIVAROXABAN 20 MG PO TABS: 20.0000 mg | ORAL_TABLET | Freq: Every day | ORAL | 6 refills | Status: DC

## 2020-04-14 NOTE — Telephone Encounter (Signed)
Spoke with pt who is aware of change to Xarelto d/t insurance coverage. RX sent into pt pharmacy of choice.  All questions, if any were answered at the time of the call.

## 2020-04-14 NOTE — Telephone Encounter (Signed)
-----   Message from [name], NP sent at 04/14/2020  4:44 PM EDT ----- Take 10 mg of torsemide today then stop, decrease high potassium foods, bananas oranges, strawberries  recheck BMP in 1 weeks to follow K+ level.

## 2020-04-15 ENCOUNTER — Telehealth: Payer: Self-pay | Admitting: Cardiology

## 2020-04-15 MED ORDER — DABIGATRAN ETEXILATE MESYLATE 150 MG PO CAPS: 150.0000 mg | ORAL_CAPSULE | Freq: Two times a day (BID) | ORAL | 1 refills | Status: DC

## 2020-04-15 NOTE — Telephone Encounter (Signed)
New Message  Received phone call from [place] and they want to know if the Pradaxa is taking the place of Eliquis as well. Please call to discuss

## 2020-04-15 NOTE — Addendum Note (Signed)
**Note De-Identified Lea Baine Obfuscation** Addended by: Dennie Fetters on: 04/15/2020 03:01 PM   Modules accepted: Orders

## 2020-04-15 NOTE — Telephone Encounter (Signed)
Pradaxa is in place of the eliquis as pt's insurance will not cover the cost of eliquis but will the cost of pradaxa.  Pharmacy is aware.

## 2020-04-15 NOTE — Telephone Encounter (Signed)
**Note De-Identified Henry Dougherty Obfuscation** The pt is advised and he is in agreement with taking Pradaxa 150 mg BID. Per his request I have sent his Pradaxa 150 mg #180 with 1 refill to [facility] on [address] in [place], [place] to fill.

## 2020-04-15 NOTE — Telephone Encounter (Signed)
**Note De-Identified Henry Dougherty Obfuscation** I attempted a Xarelto PA but could not complete as I received a message from [place] stating that the pt must first try and fail Pradaxa before they will approve Xarelto. Pt was recently switched from Eliquis to Xarelto due to ins coverage now ins requires Pradaxa be taken first.  Please advise.

## 2020-04-15 NOTE — Telephone Encounter (Signed)
He would qualify for Pradaxa 150mg  twice daily dosing.

## 2020-04-16 NOTE — Telephone Encounter (Signed)
Agree.  Henry Reifschneider, MD  

## 2020-04-24 ENCOUNTER — Telehealth: Payer: Self-pay | Admitting: *Deleted

## 2020-04-24 ENCOUNTER — Other Ambulatory Visit: Payer: Self-pay

## 2020-04-24 ENCOUNTER — Other Ambulatory Visit: Payer: No Typology Code available for payment source | Admitting: *Deleted

## 2020-04-24 DIAGNOSIS — E875 Hyperkalemia: Secondary | ICD-10-CM

## 2020-04-24 NOTE — Telephone Encounter (Signed)
Henry Dougherty, [place]  04/14/20 4:55 PM Note ----- Message from [name], NP sent at 04/14/2020  4:44 PM EDT ----- Take 10 mg of torsemide today then stop, decrease high potassium foods, bananas oranges, strawberries  recheck BMP in 1 weeks to follow K+ level.        PT HERE TODAY FOR LABS AND NEEDED ORDERS AND APPOINTMENT.  ORDER PLACED FOR BMET.

## 2020-04-25 LAB — BASIC METABOLIC PANEL
BUN/Creatinine Ratio: 14 (ref 10–24)
BUN: 19 mg/dL (ref 8–27)
CO2: 23 mmol/L (ref 20–29)
Calcium: 9.2 mg/dL (ref 8.6–10.2)
Chloride: 104 mmol/L (ref 96–106)
Creatinine, Ser: 1.36 mg/dL — ABNORMAL HIGH (ref 0.76–1.27)
GFR calc Af Amer: 65 mL/min/{1.73_m2} (ref >59)
GFR calc non Af Amer: 56 mL/min/{1.73_m2} — ABNORMAL LOW (ref >59)
Glucose: 109 mg/dL — ABNORMAL HIGH (ref 65–99)
Potassium: 5.1 mmol/L (ref 3.5–5.2)
Sodium: 141 mmol/L (ref 134–144)

## 2020-04-28 ENCOUNTER — Other Ambulatory Visit: Payer: Self-pay | Admitting: *Deleted

## 2020-04-28 ENCOUNTER — Telehealth: Payer: Self-pay | Admitting: Cardiology

## 2020-04-28 DIAGNOSIS — E875 Hyperkalemia: Secondary | ICD-10-CM

## 2020-04-28 NOTE — Telephone Encounter (Signed)
Patient returning call in regards to lab results.

## 2020-04-29 ENCOUNTER — Telehealth: Payer: Self-pay | Admitting: *Deleted

## 2020-04-29 NOTE — Telephone Encounter (Signed)
Staff message sent to [name] ok to schedule sleep study. Per Larene Beach PA received.

## 2020-04-29 NOTE — Telephone Encounter (Signed)
-----   Message from Tye Savoy sent at 04/29/2020  9:23 AM EDT ----- Regarding: RE: Sleep Study Authorization for this has been approved.  Authorization #[id number] effective 04-24-20 through 07-25-20.  ----- Message ----- From: Lauralee Evener, CMA Sent: 04/27/2020   9:00 AM EDT To: Tye Savoy Subject: RE: Sleep Study                                Good morning. Did they give you like a case # or anything so they will know who the notes belong to? When they ask for clinicals theyare requesting the ordering providers note which will be Laura's office note requesting the sleep study. ----- Message ----- From: Tye Savoy Sent: 04/24/2020  11:08 AM EDT To: Lauralee Evener, CMA Subject: RE: Sleep Study                                Pre-cert required -  Review is required for this one.  Please fax clinical notes to [phone]. Call [phone] to f/u on status ----- Message ----- From: Lauralee Evener, CMA Sent: 04/23/2020   7:15 PM EDT To: Tye Savoy Subject: RE: Sleep Study                                [place] night [address]     DX R06.83 (snoring)    Cecilie Kicks ----- Message ----- From: Jeanann Lewandowsky, RMA Sent: 04/09/2020   2:52 PM EDT To: [facility] Sleep Studies Subject: Sleep Study                                    Pt needs a sleep study. Thanks!

## 2020-05-01 ENCOUNTER — Telehealth: Payer: Self-pay | Admitting: *Deleted

## 2020-05-01 NOTE — Telephone Encounter (Signed)
-----   Message from Lauralee Evener, [place] sent at 04/29/2020  4:30 PM EDT ----- Regarding: RE: Sleep Study Ok to schedule sleep study. See Auth below ----- Message ----- From: Tye Savoy Sent: 04/29/2020   9:23 AM EDT To: Lauralee Evener, CMA, Freada Bergeron, CMA Subject: RE: Sleep Study                                Authorization for this has been approved.  Authorization #[id number] effective 04-24-20 through 07-25-20.  ----- Message ----- From: Lauralee Evener, CMA Sent: 04/27/2020   9:00 AM EDT To: Tye Savoy Subject: RE: Sleep Study                                Good morning. Did they give you like a case # or anything so they will know who the notes belong to? When they ask for clinicals theyare requesting the ordering providers note which will be Laura's office note requesting the sleep study. ----- Message ----- From: Tye Savoy Sent: 04/24/2020  11:08 AM EDT To: Lauralee Evener, CMA Subject: RE: Sleep Study                                Pre-cert required -  Review is required for this one.  Please fax clinical notes to [phone]. Call [phone] to f/u on status ----- Message ----- From: Lauralee Evener, CMA Sent: 04/23/2020   7:15 PM EDT To: Tye Savoy Subject: RE: Sleep Study                                [place] night [address]     DX R06.83 (snoring)    Cecilie Kicks ----- Message ----- From: Jeanann Lewandowsky, RMA Sent: 04/09/2020   2:52 PM EDT To: Cv Div Sleep Studies Subject: Sleep Study                                    Pt needs a sleep study. Thanks!

## 2020-05-01 NOTE — Telephone Encounter (Signed)
Patient is scheduled for lab study on 05/29/20. Pt is scheduled for COVID screening on 05/27/20 2:45 prior to ss..  Patient understands his sleep study will be done at [facility] sleep lab. Patient understands he will receive a sleep packet in a week or so. Patient understands to call if he does not receive the sleep packet in a timely manner. Patient agrees with treatment and thanked me for call.

## 2020-05-07 ENCOUNTER — Other Ambulatory Visit: Payer: No Typology Code available for payment source | Admitting: *Deleted

## 2020-05-07 ENCOUNTER — Other Ambulatory Visit: Payer: Self-pay

## 2020-05-07 DIAGNOSIS — E875 Hyperkalemia: Secondary | ICD-10-CM

## 2020-05-07 LAB — BASIC METABOLIC PANEL
BUN/Creatinine Ratio: 12 (ref 10–24)
BUN: 15 mg/dL (ref 8–27)
CO2: 22 mmol/L (ref 20–29)
Calcium: 9.3 mg/dL (ref 8.6–10.2)
Chloride: 103 mmol/L (ref 96–106)
Creatinine, Ser: 1.27 mg/dL (ref 0.76–1.27)
GFR calc Af Amer: 71 mL/min/{1.73_m2} (ref >59)
GFR calc non Af Amer: 61 mL/min/{1.73_m2} (ref >59)
Glucose: 102 mg/dL — ABNORMAL HIGH (ref 65–99)
Potassium: 4.7 mmol/L (ref 3.5–5.2)
Sodium: 139 mmol/L (ref 134–144)

## 2020-05-11 ENCOUNTER — Telehealth: Payer: Self-pay | Admitting: Cardiology

## 2020-05-11 NOTE — Telephone Encounter (Signed)
Call returned to pt.  He has been made aware of his lab results and understands to continue to avoid high K+ rich foods. Pt verbalized understanding.

## 2020-05-11 NOTE — Telephone Encounter (Signed)
-----   Message from [name], NP sent at 05/11/2020  8:20 AM EDT ----- Labs back to normal.  But still avoid K+ rich foods. Send copy to PCP

## 2020-05-11 NOTE — Telephone Encounter (Signed)
Pt called back returning a call from our office about lab results

## 2020-05-27 ENCOUNTER — Other Ambulatory Visit (HOSPITAL_COMMUNITY): Payer: No Typology Code available for payment source

## 2020-05-29 ENCOUNTER — Ambulatory Visit (HOSPITAL_BASED_OUTPATIENT_CLINIC_OR_DEPARTMENT_OTHER): Payer: No Typology Code available for payment source | Attending: Cardiology | Admitting: Cardiology

## 2020-05-29 ENCOUNTER — Other Ambulatory Visit: Payer: Self-pay

## 2020-05-29 DIAGNOSIS — G4733 Obstructive sleep apnea (adult) (pediatric): Secondary | ICD-10-CM | POA: Insufficient documentation

## 2020-05-29 DIAGNOSIS — R0683 Snoring: Secondary | ICD-10-CM | POA: Diagnosis present

## 2020-06-01 NOTE — Procedures (Signed)
Patient Name: Henry Dougherty, Henry Dougherty Date: 05/29/2020 Gender: Male D.O.B: [birth year] Age (years): 58 Referring Provider: Cecilie Kicks Height (inches): 18 Interpreting Physician: Fransico Him MD, ABSM Weight (lbs): 346 RPSGT: Earney Hamburg BMI: 50 MRN: [id number] Neck Size: 21.00  CLINICAL INFORMATION Sleep Study Type: Split Night CPAP  Indication for sleep study: Fatigue, OSA  Epworth Sleepiness Score: 4  SLEEP STUDY TECHNIQUE As per the AASM Manual for the Scoring of Sleep and Associated Events v2.3 (April 2016) with a hypopnea requiring 4% desaturations.  The channels recorded and monitored were frontal, central and occipital EEG, electrooculogram (EOG), submentalis EMG (chin), nasal and oral airflow, thoracic and abdominal wall motion, anterior tibialis EMG, snore microphone, electrocardiogram, and pulse oximetry. Continuous positive airway pressure (CPAP) was initiated when the patient met split night criteria and was titrated according to treat sleep-disordered breathing.  MEDICATIONS Medications self-administered by patient taken the night of the study : N/A  RESPIRATORY PARAMETERS Diagnostic Total AHI (/hr): 50.3  RDI (/hr):57.1  OA Index (/hr): 4.9  CA Index (/hr): 0.0 REM AHI (/hr): 44.2  NREM AHI (/hr):50.8  Supine AHI (/hr):N/A  Non-supine AHI (/hr):50.3 Min O2 Sat (%):79.0  Mean O2 (%): 93.2  Time below 88% (min):6.2   Titration Optimal Pressure (cm):13  AHI at Optimal Pressure (/hr):0.7  Min O2 at Optimal Pressure (%):92.0 Supine % at Optimal (%):0  Sleep % at Optimal (%):69   SLEEP ARCHITECTURE The recording time for the entire night was 419.6 minutes.  During a baseline period of 197.1 minutes, the patient slept for 133.5 minutes in REM and nonREM, yielding a sleep efficiency of 67.7%. Sleep onset after lights out was 12.9 minutes with a REM latency of 109.0 minutes. The patient spent 3.0% of the night in stage N1 sleep, 89.9% in stage N2  sleep, 0.0% in stage N3 and 7.1% in REM.  During the titration period of 208.1 minutes, the patient slept for 145.0 minutes in REM and nonREM, yielding a sleep efficiency of 69.7%. Sleep onset after CPAP initiation was 17.8 minutes with a REM latency of 97.0 minutes. The patient spent 1.4% of the night in stage N1 sleep, 55.9% in stage N2 sleep, 17.9% in stage N3 and 24.8% in REM.  CARDIAC DATA The 2 lead EKG demonstrated sinus rhythm. The mean heart rate was 100.0 beats per minute. Other EKG findings include: None. LEG MOVEMENT DATA The total Periodic Limb Movements of Sleep (PLMS) were 0. The PLMS index was 0.0 .  IMPRESSIONS - Severe obstructive sleep apnea occurred during the diagnostic portion of the study (AHI = 50.3/hour). An optimal PAP pressure was selected for this patient ( 13 cm of water) - No significant central sleep apnea occurred during the diagnostic portion of the study (CAI = 0.0/hour). - Mild oxygen desaturation was noted during the diagnostic portion of the study (Min O2 = 79.0%). - The patient snored with loud snoring volume during the diagnostic portion of the study. - No cardiac abnormalities were noted during this study. - Clinically significant periodic limb movements did not occur during sleep.  DIAGNOSIS - Obstructive Sleep Apnea (G47.33)  RECOMMENDATIONS - Trial of CPAP therapy on 13 cm H2O with a Medium size Fisher&Paykel Full Face Mask Simplus mask and heated humidification. - Avoid alcohol, sedatives and other CNS depressants that may worsen sleep apnea and disrupt normal sleep architecture. - Sleep hygiene should be reviewed to assess factors that may improve sleep quality. - Weight management and regular exercise should be initiated or continued. -  Return to Sleep Center for re-evaluation after 8 weeks of therapy  [Electronically signed] 06/01/2020 07:01 PM  Fransico Him MD, ABSM Diplomate, American Board of Sleep Medicine

## 2020-06-05 ENCOUNTER — Telehealth: Payer: Self-pay | Admitting: *Deleted

## 2020-06-05 NOTE — Telephone Encounter (Signed)
Informed patient of sleep study results and patient understanding was verbalized. Patient understands his sleep study showed they have significant sleep apnea and had successful PAP titration and will be set up with PAP unit. Please let DME know that order is in EPIC. Please set patient up for OV in 8 weeks.  Upon patient request DME selection is [facility]. Patient understands he will be contacted by [facility] to set up his cpap. Patient understands to call if [facility] does not contact him with new setup in a timely manner. Patient understands they will be called once confirmation has been received from [facility] that they have received their new machine to schedule 10 week follow up appointment.  [facility] notified of new cpap order  Please add to airview Patient was grateful for the call and thanked me.

## 2020-06-05 NOTE — Telephone Encounter (Signed)
-----   Message from Sueanne Margarita, MD sent at 06/01/2020  7:09 PM EDT ----- Please let patient know that they have significant sleep apnea and had successful PAP titration and will be set up with PAP unit.  Please let DME know that order is in EPIC.  Please set patient up for OV in 8 weeks

## 2020-06-30 NOTE — Telephone Encounter (Signed)
Return Call:: Reached out to patient left message to inform him it takes the insurance 15 business days to give an approval or denial and then the dme will reach out to him to set up his cpap.

## 2020-06-30 NOTE — Telephone Encounter (Signed)
Henry Dougherty is calling stating he has not been contacted in regards to receiving his CPAP machine. Please advise.

## 2020-07-02 ENCOUNTER — Telehealth: Payer: Self-pay | Admitting: Cardiology

## 2020-07-02 NOTE — Telephone Encounter (Signed)
Patient is regarding the sleep study he was told approved. He states that he was told by the insurance company that they denied it, so he is requesting to talk with [name] to ask what happened with it. Please call.

## 2020-07-09 ENCOUNTER — Other Ambulatory Visit: Payer: Self-pay

## 2020-07-09 ENCOUNTER — Encounter: Payer: Self-pay | Admitting: Cardiology

## 2020-07-09 ENCOUNTER — Ambulatory Visit (INDEPENDENT_AMBULATORY_CARE_PROVIDER_SITE_OTHER): Payer: No Typology Code available for payment source | Admitting: Cardiology

## 2020-07-09 VITALS — BP 140/80 | HR 73 | Ht 70.0 in | Wt 351.0 lb

## 2020-07-09 DIAGNOSIS — Z9889 Other specified postprocedural states: Secondary | ICD-10-CM

## 2020-07-09 DIAGNOSIS — G4733 Obstructive sleep apnea (adult) (pediatric): Secondary | ICD-10-CM | POA: Diagnosis not present

## 2020-07-09 DIAGNOSIS — I4892 Unspecified atrial flutter: Secondary | ICD-10-CM | POA: Diagnosis not present

## 2020-07-09 DIAGNOSIS — I428 Other cardiomyopathies: Secondary | ICD-10-CM

## 2020-07-09 NOTE — Patient Instructions (Addendum)
Medication Instructions:  The current medical regimen is effective;  continue present plan and medications.  *If you need a refill on your cardiac medications before your next appointment, please call your pharmacy*  Follow-Up: At [facility], you and your health needs are our priority.  As part of our continuing mission to provide you with exceptional heart care, we have created designated Provider Care Teams.  These Care Teams include your primary Cardiologist (physician) and Advanced Practice Providers (APPs -  Physician Assistants and Nurse Practitioners) who all work together to provide you with the care you need, when you need it.  We recommend signing up for the patient portal called "MyChart".  Sign up information is provided on this After Visit Summary.  MyChart is used to connect with patients for Virtual Visits (Telemedicine).  Patients are able to view lab/test results, encounter notes, upcoming appointments, etc.  Non-urgent messages can be sent to your provider as well.   To learn more about what you can do with MyChart, go to [email or web address].    Your next appointment:   6 month(s)  The format for your next appointment:   In Person  Provider:   Cecilie Kicks, NP and 1 yr with Dr Marlou Porch.  Thank you for choosing [facility]!!

## 2020-07-09 NOTE — Progress Notes (Signed)
Cardiology Office Note:    Date:  07/09/2020   ID:  Henry Dougherty, DOB [birth year], MRN [id number]  PCP:  Shon Baton, MD  [facility] Cardiologist:  Candee Furbish, MD  [facility] Electrophysiologist:  None   Referring MD: Shon Baton, MD    History of Present Illness:    Henry Dougherty is a 59 y.o. male here for follow-up of atrial flutter.  Prior visit with Cecilie Kicks on 04/09/2020 reviewed.  Feeling well. Still tired quickly. Better SOB with exertion. Not active enough. Drive in [place].   Past Medical History:  Diagnosis Date  . Allergic rhinitis   . Atrial fibrillation with RVR ([place]) 03/2020  . CHF (congestive heart failure) (HCC)    EF 45%/ VALVULAR CM  . DOE (dyspnea on exertion)   . Dyspepsia   . Environmental allergies   . Hypertension   . OA (osteoarthritis)   . Obese   . Preglaucoma   . SOB (shortness of breath)   . Uveitis    AS A TEENAGER    Past Surgical History:  Procedure Laterality Date  . CARDIAC SURGERY    . CARDIOVERSION N/A 04/01/2020   Procedure: CARDIOVERSION;  Surgeon: Geralynn Rile, MD;  Location: [facility];  Service: Cardiovascular;  Laterality: N/A;  . COLONOSCOPY     PT WAS [place]- 2-3 [place] ARTHROSCOPY Right 2005  . MITRAL VALVE REPAIR    . SHOULDER ARTHROSCOPY Right 2005  . TEE WITHOUT CARDIOVERSION N/A 04/01/2016   Procedure: TRANSESOPHAGEAL ECHOCARDIOGRAM (TEE);  Surgeon: Jerline Pain, MD;  Location: [facility];  Service: Cardiovascular;  Laterality: N/A;  . TEE WITHOUT CARDIOVERSION N/A 04/01/2020   Procedure: TRANSESOPHAGEAL ECHOCARDIOGRAM (TEE);  Surgeon: Geralynn Rile, MD;  Location: [facility];  Service: Cardiovascular;  Laterality: N/A;    Current Medications: Current Meds  Medication Sig  . dabigatran (PRADAXA) 150 MG CAPS capsule Take 1 capsule (150 mg total) by mouth 2 (two) times daily.  . metoprolol succinate (TOPROL-XL) 50 MG 24 hr tablet Take 1 tablet (50 mg total) by mouth daily.  Take with or immediately following a meal.  . tamsulosin (FLOMAX) 0.4 MG CAPS capsule Take 0.4 mg by mouth daily.  . valsartan (DIOVAN) 320 MG tablet Take 1 tablet (320 mg total) by mouth daily.     Allergies:   Amiodarone and Cortisone   Social History   Socioeconomic History  . Marital status: Married    Spouse name: Not on file  . Number of children: 3  . Years of education: Not on file  . Highest education level: Not on file  Occupational History  . 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 Smoking status: Never Smoker  . Smokeless tobacco: Never Used  Vaping Use  . Vaping Use: Never used  Substance and Sexual Activity  . Alcohol use: Yes  . Drug use: No  . Sexual activity: Not on file  Other Topics Concern  . Not on file  Social History Narrative  . Not on file   Social Determinants of Health   Financial Resource Strain:   . Difficulty of Paying Living Expenses: Not on file  Food Insecurity:   . Worried About Charity fundraiser in the Last Year: Not on file  . Ran Out of Food in the Last Year: Not on file  Transportation Needs:   . Lack of Transportation (Medical): Not on file  . Lack of Transportation (Non-Medical): Not on  file  Physical Activity:   . Days of Exercise per Week: Not on file  . Minutes of Exercise per Session: Not on file  Stress:   . Feeling of Stress : Not on file  Social Connections:   . Frequency of Communication with Friends and Family: Not on file  . Frequency of Social Gatherings with Friends and Family: Not on file  . Attends Religious Services: Not on file  . Active Member of Clubs or Organizations: Not on file  . Attends Archivist Meetings: Not on file  . Marital Status: Not on file     Family History: The patient's family history includes Atrial fibrillation in his mother; Colon cancer in his father; Heart Problems in his father and mother; Heart attack in his maternal grandfather and paternal grandfather;  Stroke in his paternal grandfather.  ROS:   Please see the history of present illness.     All other systems reviewed and are negative.  EKGs/Labs/Other Studies Reviewed:    The following studies were reviewed today:  TEE from 04/01/2020 reviewed EF 45 to 50% left atrial appendage clipping.  Recent Labs: 03/30/2020: Magnesium 2.0; TSH 1.459 04/09/2020: Hemoglobin 17.1; Platelets 313 05/07/2020: BUN 15; Creatinine, Ser 1.27; Potassium 4.7; Sodium 139  Recent Lipid Panel No results found for: CHOL, TRIG, HDL, CHOLHDL, VLDL, LDLCALC, LDLDIRECT  Physical Exam:    VS:  BP 140/80   Pulse 73   Ht 5\' 10"  (1.778 m)   Wt (!) 351 lb (159.2 kg)   SpO2 97%   BMI 50.36 kg/m     Wt Readings from Last 3 Encounters:  07/09/20 (!) 351 lb (159.2 kg)  05/29/20 (!) 346 lb (156.9 kg)  04/09/20 (!) 349 lb (158.3 kg)     GEN:  Well nourished, well developed in no acute distress, obese HEENT: Normal NECK: No JVD; No carotid bruits LYMPHATICS: No lymphadenopathy CARDIAC: RRR, no murmurs, rubs, gallops RESPIRATORY:  Clear to auscultation without rales, wheezing or rhonchi  ABDOMEN: Soft, non-tender, non-distended MUSCULOSKELETAL:  No edema; No deformity  SKIN: Warm and dry NEUROLOGIC:  Alert and oriented x 3 PSYCHIATRIC:  Normal affect   ASSESSMENT:    1. Atrial flutter with rapid ventricular response ([place])   2. S/P MVR (mitral valve repair)   3. Morbid obesity ([place])   4. OSA (obstructive sleep apnea)   5. Nonischemic cardiomyopathy (HCC)    PLAN:    In order of problems listed above:  Paroxysmal atrial flutter despite M[place] procedure -Remained in sinus rhythm post TEE.  Heart rate has been difficult to control atrial fibrillation/flutter, and sinus heart rate in the upper 60s.  Metoprolol was decreased to 50 mg daily because of systolic blood pressures of 105 at times.  Fatigue in the past noted on beta-blocker.  Chronic anticoagulation -Eliquis/now Pradaxa.  Hemoglobin 15.8  creatinine 1.2 TSH 1.4 LDL 106.  Outside labs.  Reviewed  Left atrial appendage clipping -Protection against thrombus  Mitral valve repair and PFO closure -Continue with endocarditis prophylaxis. -Prior history of ruptured mitral valve cord and [place].  Nonischemic cardiomyopathy -EF 45% previously.  Minimally reduced.  Morbid obesity -Continue to encourage weight loss.  Low-carb, diet.  Challenging for him given his sedentary time at desk he states as well as previous driving back and forth to [place].  Obstructive sleep apnea -Sleep study, telephone notes about insurance approval reviewed for the EEG portion. Has severe OSA on study.  He is eager to get his mask.  Understands  that this is a contributing factor to arrhythmia.   Medication Adjustments/Labs and Tests Ordered: Current medicines are reviewed at length with the patient today.  Concerns regarding medicines are outlined above.  No orders of the defined types were placed in this encounter.  No orders of the defined types were placed in this encounter.   Patient Instructions  Medication Instructions:  The current medical regimen is effective;  continue present plan and medications.  *If you need a refill on your cardiac medications before your next appointment, please call your pharmacy*  Follow-Up: At [facility], you and your health needs are our priority.  As part of our continuing mission to provide you with exceptional heart care, we have created designated Provider Care Teams.  These Care Teams include your primary Cardiologist (physician) and Advanced Practice Providers (APPs -  Physician Assistants and Nurse Practitioners) who all work together to provide you with the care you need, when you need it.  We recommend signing up for the patient portal called "MyChart".  Sign up information is provided on this After Visit Summary.  MyChart is used to connect with patients for Virtual Visits (Telemedicine).   Patients are able to view lab/test results, encounter notes, upcoming appointments, etc.  Non-urgent messages can be sent to your provider as well.   To learn more about what you can do with MyChart, go to [email or web address].    Your next appointment:   6 month(s)  The format for your next appointment:   In Person  Provider:   Cecilie Kicks, NP and 1 yr with Dr Marlou Porch.  Thank you for choosing [facility]!!         Signed, Candee Furbish, MD  07/09/2020 8:46 AM    [facility]

## 2020-07-14 NOTE — Telephone Encounter (Signed)
Patient is calling to follow up in regards to sleep study.

## 2020-07-15 NOTE — Telephone Encounter (Signed)
Called billing at [facility]) and was told to tell the patient to call [place] billing at [phone] opt 1 (hospital charges). Reached out to patient and left message on vm to call [place] at the number provided.

## 2020-07-16 NOTE — Telephone Encounter (Signed)
Per Suanne Marker at [employer] is trying to reach patients insurance company [facility]) for approval. [employer] will contact the patient as I will.

## 2020-07-16 NOTE — Telephone Encounter (Signed)
Per Suanne Marker at [employer] is trying to reach patients insurance company [facility]) for approval. [employer] will contact the patient as I will.

## 2020-07-23 NOTE — Telephone Encounter (Signed)
Patient called to say he still has not heard anything about his cpap. I reached out to [employer]'s referral department and found out they have not been able to reach the patients insurance [place]. I reached back out to the patient and he provided me the number to the authorization team at his insurance [id number]. [employer]'s referral department has been notified and given the contact number.

## 2020-07-23 NOTE — Telephone Encounter (Signed)
Patient called to say he still has not heard anything about his cpap. I reached out to [employer]'s referral department and found out they have not been able to reach the patients insurance [place]. I reached back out to the patient and he provided me the number to the authorization team at his insurance [phone]. [employer]'s referral department has been notified and given the contact number.

## 2020-08-03 NOTE — Telephone Encounter (Signed)
Patient called in to say he wants to change his dme to [employer] from [employer] because he has not heard anything from [employer] since I sent the order to them. Reached out to Tanja Port at [employer] and she will cancel the order. Dimas Chyle called to question the order cancellation and after checking she found out [employer] [facility] was out of network with his insurance. Patient ask to be referred to [place].

## 2020-08-14 NOTE — Telephone Encounter (Signed)
Henry Dougherty is calling requesting to speak with [name] due to reaching out to insurance and them advising her they have not received any prior authorization request for the patient to receive a CPAP machine. She is also requesting a personal call from Dr. Marlou Porch himself. Please advise.

## 2020-08-17 NOTE — Telephone Encounter (Signed)
Reached out to patients wife and was informed he should be getting his cpap unit in the near future. Pt is agreeable to treatment.

## 2020-08-20 ENCOUNTER — Telehealth: Payer: Self-pay | Admitting: Cardiology

## 2020-08-20 NOTE — Telephone Encounter (Signed)
[name] from [facility] in [place] called me and said we did the sleep study for this patient and he needs a CPAP.  She says there is a Event organiser and wants to know if anybody in our area can get one for him.  Her number is [phone].  I forwarded this to [place].

## 2020-08-20 NOTE — Telephone Encounter (Signed)
She is calling it see is somewhere he can go to locally but there is a shortage in [place], because the plan would probably cover it.

## 2020-08-21 NOTE — Telephone Encounter (Signed)
Reached out to [place] and gave her [facility] and [employer] dme suggestions.

## 2020-08-25 NOTE — Telephone Encounter (Signed)
[name] from [facility] in [place] states she will call Henry Dougherty to get an update on when he may be able to receive his cpap.

## 2020-09-23 NOTE — Telephone Encounter (Signed)
Henry Dougherty at [facility] in [place] called to ask if our office could speed up getting patients new cpap machine. Laqueta Linden understands there is a Consulting civil engineer on cpaps and patients have to wait 4-6 weeks for new or replacement machines. Laqueta Linden asked if our office could speed up the process and I informed her our office only sends out the order we do not have any machines n our office. When she asked what the patient could do in the meanwhile I informed her the patient should contact his dme and ask for a bacteria filter that should be provided to him free of charge by his dme. Laqueta Linden will speak to the patient and relay this information.

## 2020-10-31 ENCOUNTER — Other Ambulatory Visit: Payer: Self-pay | Admitting: Cardiology

## 2020-11-02 NOTE — Telephone Encounter (Signed)
Pt last saw Dr Marlou Porch 07/09/20, last labs 05/07/20 Creat 1.27, age 59, weight 159.2kg, CrCl 137.54, based on CrCl pt is on appropriate dosage of Pradaxa 150mg  BID.  Will refill rx.

## 2021-01-22 ENCOUNTER — Emergency Department (HOSPITAL_COMMUNITY)
Admission: EM | Admit: 2021-01-22 | Discharge: 2021-01-22 | Disposition: A | Discharge status: Home / Self Care | Payer: No Typology Code available for payment source | Attending: Emergency Medicine | Admitting: Emergency Medicine

## 2021-01-22 ENCOUNTER — Encounter (HOSPITAL_COMMUNITY): Payer: Self-pay

## 2021-01-22 ENCOUNTER — Emergency Department (HOSPITAL_COMMUNITY): Payer: No Typology Code available for payment source

## 2021-01-22 ENCOUNTER — Telehealth (HOSPITAL_COMMUNITY): Payer: Self-pay

## 2021-01-22 ENCOUNTER — Other Ambulatory Visit: Payer: Self-pay

## 2021-01-22 ENCOUNTER — Telehealth: Payer: Self-pay | Admitting: Cardiology

## 2021-01-22 DIAGNOSIS — I4892 Unspecified atrial flutter: Secondary | ICD-10-CM

## 2021-01-22 DIAGNOSIS — I110 Hypertensive heart disease with heart failure: Secondary | ICD-10-CM | POA: Insufficient documentation

## 2021-01-22 DIAGNOSIS — Z20822 Contact with and (suspected) exposure to covid-19: Secondary | ICD-10-CM | POA: Diagnosis not present

## 2021-01-22 DIAGNOSIS — Z79899 Other long term (current) drug therapy: Secondary | ICD-10-CM | POA: Insufficient documentation

## 2021-01-22 DIAGNOSIS — I5043 Acute on chronic combined systolic (congestive) and diastolic (congestive) heart failure: Secondary | ICD-10-CM | POA: Diagnosis not present

## 2021-01-22 DIAGNOSIS — R5383 Other fatigue: Secondary | ICD-10-CM | POA: Diagnosis present

## 2021-01-22 DIAGNOSIS — I480 Paroxysmal atrial fibrillation: Secondary | ICD-10-CM | POA: Diagnosis not present

## 2021-01-22 DIAGNOSIS — I483 Typical atrial flutter: Secondary | ICD-10-CM | POA: Diagnosis not present

## 2021-01-22 LAB — COMPREHENSIVE METABOLIC PANEL
ALT: 37 U/L (ref 0–44)
AST: 21 U/L (ref 15–41)
Albumin: 3.7 g/dL (ref 3.5–5.0)
Alkaline Phosphatase: 41 U/L (ref 38–126)
Anion gap: 12 (ref 5–15)
BUN: 16 mg/dL (ref 8–23)
CO2: 23 mmol/L (ref 22–32)
Calcium: 9.1 mg/dL (ref 8.9–10.3)
Chloride: 102 mmol/L (ref 98–111)
Creatinine, Ser: 1.59 mg/dL — ABNORMAL HIGH (ref 0.61–1.24)
GFR, Estimated: 49 mL/min — ABNORMAL LOW (ref >60)
Glucose, Bld: 184 mg/dL — ABNORMAL HIGH (ref 70–99)
Potassium: 4.5 mmol/L (ref 3.5–5.1)
Sodium: 137 mmol/L (ref 135–145)
Total Bilirubin: 1 mg/dL (ref 0.3–1.2)
Total Protein: 6.3 g/dL — ABNORMAL LOW (ref 6.5–8.1)

## 2021-01-22 LAB — RESP PANEL BY RT-PCR (FLU A&B, COVID) ARPGX2
Influenza A by PCR: NEGATIVE
Influenza B by PCR: NEGATIVE
SARS Coronavirus 2 by RT PCR: NEGATIVE

## 2021-01-22 LAB — CBC WITH DIFFERENTIAL/PLATELET
Abs Immature Granulocytes: 0.05 10*3/uL (ref 0.00–0.07)
Basophils Absolute: 0.1 10*3/uL (ref 0.0–0.1)
Basophils Relative: 1 %
Eosinophils Absolute: 0.2 10*3/uL (ref 0.0–0.5)
Eosinophils Relative: 3 %
HCT: 49.8 % (ref 39.0–52.0)
Hemoglobin: 16.8 g/dL (ref 13.0–17.0)
Immature Granulocytes: 1 %
Lymphocytes Relative: 17 %
Lymphs Abs: 1.3 10*3/uL (ref 0.7–4.0)
MCH: 32.6 pg (ref 26.0–34.0)
MCHC: 33.7 g/dL (ref 30.0–36.0)
MCV: 96.5 fL (ref 80.0–100.0)
Monocytes Absolute: 0.6 10*3/uL (ref 0.1–1.0)
Monocytes Relative: 9 %
Neutro Abs: 5.2 10*3/uL (ref 1.7–7.7)
Neutrophils Relative %: 69 %
Platelets: 300 10*3/uL (ref 150–400)
RBC: 5.16 MIL/uL (ref 4.22–5.81)
RDW: 12.6 % (ref 11.5–15.5)
WBC: 7.5 10*3/uL (ref 4.0–10.5)
nRBC: 0 % (ref 0.0–0.2)

## 2021-01-22 LAB — MAGNESIUM: Magnesium: 1.7 mg/dL (ref 1.7–2.4)

## 2021-01-22 LAB — PROTIME-INR
INR: 1.2 (ref 0.8–1.2)
Prothrombin Time: 14.5 seconds (ref 11.4–15.2)

## 2021-01-22 MED ORDER — ETOMIDATE 2 MG/ML IV SOLN
INTRAVENOUS | Status: AC | PRN
  Administered 2021-01-22: 10 mg via INTRAVENOUS

## 2021-01-22 MED ORDER — SODIUM CHLORIDE 0.9 % IV BOLUS
500.0000 mL | Freq: Once | INTRAVENOUS | Status: AC
  Administered 2021-01-22: 500 mL via INTRAVENOUS

## 2021-01-22 MED ORDER — ETOMIDATE 2 MG/ML IV SOLN
10.0000 mg | Freq: Once | INTRAVENOUS | Status: DC
  Filled 2021-01-22: qty 10

## 2021-01-22 NOTE — Telephone Encounter (Signed)
Reached out to patient to schedule ED f/u. He stated that he wanted to schedule an f/u with Dr. Gillian Shields. Informed him that if not within a week make sure to reach out to [facility] to make sure he is still in rhythm.

## 2021-01-22 NOTE — Telephone Encounter (Signed)
Patient's wife states the patient's HR has been sustained at 144 for the last 48 hrs. She states it was 145 about 10 mintues ago. She states he had a 4-5 years ago he had a mital valve repair. She states he also had a cardioversion. She states she is about to drive him to the ED now, but if a nurse needs to speak with them to call [phone]

## 2021-01-22 NOTE — ED Provider Notes (Signed)
[facility] EMERGENCY DEPARTMENT Provider Note   CSN: [id number] Arrival date & time: 01/22/21  0857     History Chief Complaint  Patient presents with  . Atrial Fibrillation  . Tachycardia    Henry Dougherty is a 60 y.o. male.  HPI   Patient with significant medical history of atrial fib currently on Pradaxa, CHF EF of 45, hypertension, obesity, sleep apnea presents with chief complaint of not feeling well.  Patient endorses since Wednesday he has been feeling more fatigued, has dyspnea on exertion, dizziness and lightheaded especially from going from a sitting to standing position.  He endorses that he checked his pulse and  found that his heart rate was extremely elevated.   He states he has had a -flutter in the past and was recent cardioverted about 10 months ago.  He states this feels like similar presentation.  He denies associated chest pain, radiating pains, palpitations, nausea, vomiting, becoming diaphoretic.  Patient endorses that he has been compliant with his medications, has not missed any of his anticoagulants.  Patient does endorse that over the weekend he did have a stomach bug had associated nausea without vomiting diarrhea but this is since resolved.  He denies any alleviating factors at this time.  Patient denies headaches, fevers, chills, chest pain, abdominal pain, nausea, vomiting, diarrhea, worsening pedal edema.  Past Medical History:  Diagnosis Date  . Allergic rhinitis   . Atrial fibrillation with RVR ([employer]) 03/2020  . CHF (congestive heart failure) (HCC)    EF 45%/ VALVULAR CM  . DOE (dyspnea on exertion)   . Dyspepsia   . Environmental allergies   . Hypertension   . OA (osteoarthritis)   . Obese   . Preglaucoma   . SOB (shortness of breath)   . Uveitis    AS A TEENAGER    Patient Active Problem List   Diagnosis Date Noted  . Morbid obesity ([place]) 04/13/2016  . Acute on chronic renal insufficiency 04/13/2016  . Pericardial effusion    . Pleural effusion   . SOB (shortness of breath)   . Acute systolic heart failure ([place])   . Acute on chronic combined systolic and diastolic heart failure ([place]) 04/01/2016  . Valvular cardiomyopathy ([place])   . Acute respiratory failure ([place])   . S/P Maze operation for atrial fibrillation 03/30/2016  . HCAP (healthcare-associated pneumonia) 03/30/2016  . Gram-positive cocci bacteremia - MRSA 03/30/2016  . Atrial flutter with rapid ventricular response ([place]) 03/29/2016  . Long term (current) use of anticoagulants [Z79.01] 03/29/2016  . S/P MVR (mitral valve repair) 03/29/2016  . Hypotension (arterial) 03/29/2016  . DOE (dyspnea on exertion) 03/29/2016  . Dyspnea 03/29/2016    Past Surgical History:  Procedure Laterality Date  . CARDIAC SURGERY    . CARDIOVERSION N/A 04/01/2020   Procedure: CARDIOVERSION;  Surgeon: Geralynn Rile, MD;  Location: [facility];  Service: Cardiovascular;  Laterality: N/A;  . COLONOSCOPY     PT WAS [facility]- 2-3 [place] ARTHROSCOPY Right 2005  . MITRAL VALVE REPAIR    . SHOULDER ARTHROSCOPY Right 2005  . TEE WITHOUT CARDIOVERSION N/A 04/01/2016   Procedure: TRANSESOPHAGEAL ECHOCARDIOGRAM (TEE);  Surgeon: Jerline Pain, MD;  Location: [facility];  Service: Cardiovascular;  Laterality: N/A;  . TEE WITHOUT CARDIOVERSION N/A 04/01/2020   Procedure: TRANSESOPHAGEAL ECHOCARDIOGRAM (TEE);  Surgeon: Geralynn Rile, MD;  Location: [facility];  Service: Cardiovascular;  Laterality: N/A;       Family  History  Problem Relation Age of Onset  . Heart Problems Mother   . Atrial fibrillation Mother   . Colon cancer Father   . Heart Problems Father   . Heart attack Maternal Grandfather        died from heart attack in his 27's  . Heart attack Paternal Grandfather   . Stroke Paternal Grandfather     Social History   Tobacco Use  . Smoking status: Never Smoker  . Smokeless tobacco: Never Used  Vaping Use  . Vaping Use: Never  used  Substance Use Topics  . Alcohol use: Yes  . Drug use: No    Home Medications Prior to Admission medications   Medication Sig Start Date End Date Taking? Authorizing Provider  acetaminophen (TYLENOL) 500 MG tablet Take 1,000 mg by mouth as needed (pain).   Yes [provider]  metoprolol succinate (TOPROL-XL) 50 MG 24 hr tablet Take 1 tablet (50 mg total) by mouth daily. Take with or immediately following a meal. 04/09/20 07/09/20 Yes [name], NP  PRADAXA 150 MG CAPS capsule Take 1 capsule by mouth twice daily 11/02/20  Yes Jerline Pain, MD  tamsulosin (FLOMAX) 0.4 MG CAPS capsule Take 0.4 mg by mouth every evening. 10/16/19  Yes [provider]  valsartan (DIOVAN) 320 MG tablet Take 1 tablet (320 mg total) by mouth daily. 02/04/20  Yes Jerline Pain, MD    Allergies    Amiodarone and Cortisone  Review of Systems   Review of Systems  Constitutional: Positive for fatigue. Negative for chills and fever.  HENT: Negative for congestion and sore throat.   Respiratory: Positive for shortness of breath.   Cardiovascular: Positive for leg swelling. Negative for chest pain.  Gastrointestinal: Negative for abdominal pain, diarrhea and vomiting.  Genitourinary: Negative for enuresis and flank pain.  Musculoskeletal: Negative for back pain.  Skin: Negative for rash.  Neurological: Positive for dizziness and light-headedness. Negative for headaches.  Hematological: Does not bruise/bleed easily.    Physical Exam Updated Vital Signs BP 129/87   Pulse 86   Temp 98 F (36.7 C)   Resp 18   SpO2 97%   Physical Exam Vitals and nursing note reviewed.  Constitutional:      General: He is not in acute distress.    Appearance: He is not ill-appearing.  HENT:     Head: Normocephalic and atraumatic.     Nose: No congestion.  Eyes:     Conjunctiva/sclera: Conjunctivae normal.  Cardiovascular:     Rate and Rhythm: Regular rhythm. Tachycardia present.      Pulses: Normal pulses.     Heart sounds: No murmur heard. No friction rub. No gallop.   Pulmonary:     Effort: No respiratory distress.     Breath sounds: No wheezing, rhonchi or rales.  Abdominal:     Palpations: Abdomen is soft.     Tenderness: There is no abdominal tenderness.  Musculoskeletal:     Right lower leg: No edema.     Left lower leg: No edema.  Skin:    General: Skin is warm and dry.  Neurological:     Mental Status: He is alert.  Psychiatric:        Mood and Affect: Mood normal.     ED Results / Procedures / Treatments   Labs (all labs ordered are listed, but only abnormal results are displayed) Labs Reviewed  COMPREHENSIVE METABOLIC PANEL - Abnormal; Notable for the following components:  Result Value   Glucose, Bld 184 (*)    Creatinine, Ser 1.59 (*)    Total Protein 6.3 (*)    GFR, Estimated 49 (*)    All other components within normal limits  RESP PANEL BY RT-PCR (FLU A&B, COVID) ARPGX2  CBC WITH DIFFERENTIAL/PLATELET  MAGNESIUM  PROTIME-INR    EKG EKG Interpretation  Date/Time:  Friday January 22 2021 09:00:46 EST Ventricular Rate:  172 PR Interval:    QRS Duration: 72 QT Interval:  264 QTC Calculation: 446 R Axis:   26 Text Interpretation: Atrial flutter with variable A-V block Low voltage QRS Nonspecific ST and T wave abnormality Abnormal ECG When compared to prior, now aflutter. No STEMI Confirmed by Antony Blackbird [phone]) on 01/22/2021 9:04:32 AM   Radiology DG Chest Portable 1 View  Result Date: 01/22/2021 CLINICAL DATA:  60 year old male with chest palpitations. EXAM: PORTABLE CHEST 1 VIEW COMPARISON:  Portable chest 03/30/2020 and earlier. FINDINGS: Portable AP semi upright view at 0931 hours. Stable lung volumes and cardiomegaly. Prior cardiac valve replacement and atrial clipping. Other mediastinal contours are within normal limits. Vague chronic peripheral right lung opacity is stable and appears to be the sequelae surgery following  a complex right lung process in 2017. stable lung volumes. No pneumothorax, pulmonary edema or acute pulmonary opacity. Visualized tracheal air column is within normal limits. No acute osseous abnormality identified. IMPRESSION: 1. Chronic cardiomegaly, valve replacement, and chronic changes in the right lung. 2. No acute cardiopulmonary abnormality. Electronically Signed   By: Genevie Ann M.D.   On: 01/22/2021 09:56    Procedures .Critical Care Performed by: Marcello Fennel, PA-C Authorized by: Marcello Fennel, PA-C   Critical care provider statement:    Critical care time (minutes):  45   Critical care time was exclusive of:  Separately billable procedures and treating other patients   Critical care was necessary to treat or prevent imminent or life-threatening deterioration of the following conditions:  Cardiac failure (Atrial fibrillation with RVR)   Critical care was time spent personally by me on the following activities:  Discussions with consultants, evaluation of patient's response to treatment, examination of patient, ordering and performing treatments and interventions, ordering and review of laboratory studies, ordering and review of radiographic studies, pulse oximetry, re-evaluation of patient's condition, obtaining history from patient or surrogate and review of old charts   I assumed direction of critical care for this patient from another provider in my specialty: no   .Cardioversion  Date/Time: 01/22/2021 12:01 PM Performed by: Marcello Fennel, PA-C Authorized by: Marcello Fennel, PA-C   Consent:    Consent obtained:  Verbal   Consent given by:  Patient   Risks discussed:  Cutaneous burn, death, induced arrhythmia and pain   Alternatives discussed:  No treatment Pre-procedure details:    Cardioversion basis:  Emergent   Rhythm:  Atrial flutter   Electrode placement:  Anterior-lateral Patient sedated: Yes. Refer to sedation procedure documentation for details  of sedation.  Attempt one:    Cardioversion mode:  Synchronous   Shock (Joules):  200   Shock outcome:  Conversion to normal sinus rhythm Post-procedure details:    Patient status:  Alert   Patient tolerance of procedure:  Tolerated well, no immediate complications     Medications Ordered in ED Medications  etomidate (AMIDATE) injection 10 mg (0 mg Intravenous Hold 01/22/21 1216)  sodium chloride 0.9 % bolus 500 mL (500 mLs Intravenous New Bag/Given 01/22/21 0926)  etomidate (AMIDATE)  injection (10 mg Intravenous Given 01/22/21 1151)    ED Course  I have reviewed the triage vital signs and the nursing notes.  Pertinent labs & imaging results that were available during my care of the patient were reviewed by me and considered in my medical decision making (see chart for details).    MDM Rules/Calculators/A&P                         Initial impression-patient presents with not feeling well and dyspnea on exertion.  He is alert, does not appear in acute distress, vital signs notable for a flutter with RVR.  Suspect patient may need to be cardioverted,  will obtain basic lab work, start him on a small bolus of fluids consult cardiology for further recommendations.  Work-up-CBC unremarkable, CMP shows slight hyperglycemia 184, slight increase in creatinine of 159 baseline is 1.2, magnesium 1.7, prothrombin time 14.5, INR 1.2  Consult spoke with Dr. Marlou Porch of cardiology, he feels patient will most likely have to be cardioverted, he will come down and speak with the patient.  Dr. Marlou Porch assessed the patient and agrees with cardioversion at this time.  As long as lab work looks reassuring and have had a successful cardioversion patient can be discharged home and he will follow up with EP for further evaluation.  Reassessment cardioversion was successfully completed, patient is now in sinus rhythm, vital signs are reassuring.  Patient is alert and awake.  Has no complaints at this time.   will  continue to monitor patient for 40 minutes and then discharge as long as patient remains stable  Patient was reevaluated 50 minutes after cardioversion, states he is feeling much better, has no complaints at this time, vital signs remained stable.  He is tolerating p.o. without difficulty.  Patient is agreeable for discharge at this time.  Rule out-low suspicion for ACS as patient denies chest pain, rating pain, nausea or vomiting, history is atypical, EKG does not show signs of ischemia.  Low suspicion for CHF exacerbation as lung sounds are clear bilaterally, no signs of edema need noted on exam or on imaging.  Low suspicion for electrolyte derailment causing arrhythmias as metabolic panel is negative for acute findings, magnesium is within normal limits.  Low suspicion for systemic infection as patient is nontoxic-appearing, no signs of infection on exam within lab work.  I suspect patient's atrial flutter with RVR secondary due to dehydration from viral gastroenteritis.  Plan-patient had flutter with RVR secondary due to viral infection.  Patient was successfully cardiac converted to sinus rhythm, will have him follow-up with Dr. Marlou Porch for further evaluation Continue with medications as prescribed.  Vital signs have remained stable, no indication for hospital admission.  Patient discussed with attending and they agreed with assessment and plan.  Patient given at home care as well strict return precautions.  Patient verbalized that they understood agreed to said plan.   Final Clinical Impression(s) / ED Diagnoses Final diagnoses:  Atrial flutter with rapid ventricular response [facility])    Rx / DC Orders ED Discharge Orders    None       Marcello Fennel, PA-C 01/22/21 1314    Tegeler, Gwenyth Allegra, MD 01/23/21 1615

## 2021-01-22 NOTE — Consult Note (Signed)
Cardiology Consultation:   Patient ID: Henry Dougherty MRN: [id number]; DOB: October 06, 1961  Admit date: 01/22/2021 Date of Consult: 01/22/2021  PCP:  Henry Baton, MD   [facility]  Cardiologist:  Henry Furbish, MD  Advanced Practice Provider:  No care team member to display Electrophysiologist:  None        Patient Profile:   Henry Dougherty is a 60 y.o. male with a hx of atrial flutter who is being seen today for the evaluation of atrial flutter rapid ventricular response at the request of Henry Dougherty.  History of Present Illness:   Henry Dougherty 60 year old male with atrial flutter on Pradaxa has not missed any doses, prior cardioversion 04/01/2020, mitral valve repair, PFO closure, left atrial appendage clipping in [place] with nonischemic cardiomyopathy EF 45% previously, morbid obesity who presented here with shortness of breath increased palpitations.  He was found to be in atrial flutter with rapid ventricular response heart rate 160 bpm.  At home have been doing quite well, last seen him about 6 months ago metoprolol was previously decreased to 50 mg daily because of systolic blood pressures of 105 at times.  He has noted fatigue in the past on beta-blocker.  His heart has been racing for the past few days.  Had a stomach virus over the weekend which likely tipped him over to atrial fibrillation/flutter.  Quite profuse diarrhea he states.  Now feeling better.  No significant chest Dougherty, mild shortness of breath with activity.  Sometimes has trouble feeling his palpitations.  Previously in May when he had a bout of atrial fibrillation, pneumonia triggered this.  This is his second bout of atrial fibrillation since his surgery/Maze procedure in 2021.  Past Medical History:  Diagnosis Date  . Allergic rhinitis   . Atrial fibrillation with RVR (Henry Dougherty) 03/2020  . CHF (congestive heart failure) (HCC)    EF 45%/ VALVULAR CM  . DOE (dyspnea on exertion)   .  Dyspepsia   . Environmental allergies   . Hypertension   . OA (osteoarthritis)   . Obese   . Preglaucoma   . SOB (shortness of breath)   . Uveitis    AS A TEENAGER    Past Surgical History:  Procedure Laterality Date  . CARDIAC SURGERY    . CARDIOVERSION N/A 04/01/2020   Procedure: CARDIOVERSION;  Surgeon: Henry Rile, MD;  Location: [facility];  Service: Cardiovascular;  Laterality: N/A;  . COLONOSCOPY     PT WAS [facility]- 2-3 [place] ARTHROSCOPY Right 2005  . MITRAL VALVE REPAIR    . SHOULDER ARTHROSCOPY Right 2005  . TEE WITHOUT CARDIOVERSION N/A 04/01/2016   Procedure: TRANSESOPHAGEAL ECHOCARDIOGRAM (TEE);  Surgeon: Henry Pain, MD;  Location: [facility];  Service: Cardiovascular;  Laterality: N/A;  . TEE WITHOUT CARDIOVERSION N/A 04/01/2020   Procedure: TRANSESOPHAGEAL ECHOCARDIOGRAM (TEE);  Surgeon: Henry Rile, MD;  Location: [facility];  Service: Cardiovascular;  Laterality: N/A;     Home Medications:  Prior to Admission medications   Medication Sig Start Date End Date Taking? Authorizing Provider  metoprolol succinate (TOPROL-XL) 50 MG 24 hr tablet Take 1 tablet (50 mg total) by mouth daily. Take with or immediately following a meal. 04/09/20 07/09/20  Henry Serge, NP  PRADAXA 150 MG CAPS capsule Take 1 capsule by mouth twice daily 11/02/20   Henry Pain, MD  tamsulosin (FLOMAX) 0.4 MG CAPS capsule Take 0.4 mg by mouth daily. 10/16/19  [provider]  valsartan (DIOVAN) 320 MG tablet Take 1 tablet (320 mg total) by mouth daily. 02/04/20   Henry Pain, MD    Inpatient Medications: Scheduled Meds:  Continuous Infusions:  PRN Meds:   Allergies:    Allergies  Allergen Reactions  . Amiodarone     Blisters and whelps to IV FORM.  Marland Kitchen Cortisone     Glaucoma HX ,  Triggers increased pressure in eye    Social History:   Social History   Socioeconomic History  . Marital status: Married    Spouse name: Not on  file  . Number of children: 3  . Years of education: Not on file  . Highest education level: Not on file  Occupational History  . Occupation: Henry Dougherty  Tobacco Use  . Smoking status: Never Smoker  . Smokeless tobacco: Never Used  Vaping Use  . Vaping Use: Never used  Substance and Sexual Activity  . Alcohol use: Yes  . Drug use: No  . Sexual activity: Not on file  Other Topics Concern  . Not on file  Social History Narrative  . Not on file   Social Determinants of Health   Financial Resource Strain: Not on file  Food Insecurity: Not on file  Transportation Needs: Not on file  Physical Activity: Not on file  Stress: Not on file  Social Connections: Not on file  Intimate Partner Violence: Not on file    Family History:    Family History  Problem Relation Age of Onset  . Heart Problems Mother   . Atrial fibrillation Mother   . Colon cancer Father   . Heart Problems Father   . Heart attack Maternal Grandfather        died from heart attack in his 34's  . Heart attack Paternal Grandfather   . Stroke Paternal Grandfather      ROS:  Please see the history of present illness.   All other ROS reviewed and negative.     Physical Exam/Data:   Vitals:   01/22/21 0901  BP: (!) 142/100  Pulse: (!) 165  Resp: 20  Temp: 98 F (36.7 C)  SpO2: 100%   No intake or output data in the 24 hours ending 01/22/21 0936 Last 3 Weights 07/09/2020 05/29/2020 04/09/2020  Weight (lbs) 351 lb 346 lb 349 lb  Weight (kg) 159.213 kg 156.945 kg 158.305 kg     There is no height or weight on file to calculate BMI.  General:  Well nourished, well developed, in no acute distress HEENT: normal Lymph: no adenopathy Neck: no JVD Endocrine:  No thryomegaly Vascular: No carotid bruits; FA pulses 2+ bilaterally without bruits  Cardiac: Tacky S1, S2;  no murmur  Lungs:  clear to auscultation bilaterally, no wheezing, rhonchi or rales  Abd: soft, nontender, no hepatomegaly  Ext:  no edema Musculoskeletal:  No deformities, BUE and BLE strength normal and equal Skin: warm and dry  Neuro:  CNs 2-12 intact, no focal abnormalities noted Psych:  Normal affect   EKG:  The EKG was personally reviewed and demonstrates: Atrial fibrillation/flutter 172 variable AV block Telemetry:  Telemetry was personally reviewed and demonstrates: Atrial fibrillation/flutter rapid ventricular response  Relevant CV Studies: Prior TEE 04/01/2020-EF 45 to 50% with left atrial appendage clipping  Laboratory Data:  High Sensitivity Troponin:  No results for input(s): TROPONINIHS in the last 720 hours.   ChemistryNo results for input(s): NA, K, CL, CO2, GLUCOSE, BUN, CREATININE, CALCIUM,  GFRNONAA, GFRAA, ANIONGAP in the last 168 hours.  No results for input(s): PROT, ALBUMIN, AST, ALT, ALKPHOS, BILITOT in the last 168 hours. HematologyNo results for input(s): WBC, RBC, HGB, HCT, MCV, MCH, MCHC, RDW, PLT in the last 168 hours. BNPNo results for input(s): BNP, PROBNP in the last 168 hours.  DDimer No results for input(s): DDIMER in the last 168 hours.   Radiology/Studies:  No results found.   Assessment and Plan:   Atrial flutter with rapid ventricular response -Cardioversion.  Has not missed any Pradaxa.  Risk and benefits explained.  He is willing to proceed.  Wife in room as well.  Discussed with ER team.  They will perform procedure.  Excellent. -I will set him up with EP to discuss further treatment options given that this is his second bout of RVR since his maze.  Left atrial appendage clipping/mitral valve repair/PFO/prior Maze procedure closure in [place] -Stable.  Chronic anticoagulation -On Pradaxa.  No bleeding.  Recent diarrheal illness -Getting IV fluids currently.  Checking electrolytes.  Post cardioversion if stable, should be able to be discharged.   Risk Assessment/Risk Scores:           For questions or updates, please contact [facility] Please  consult [email or web address] for contact info under    Signed, Henry Furbish, MD  01/22/2021 9:36 AM

## 2021-01-22 NOTE — Telephone Encounter (Signed)
Attempted to call wife.  Phone rang several times and then call disconnected.  Tried again and received busy signal.  Was calling to let wife know that ER is appropriate.

## 2021-01-22 NOTE — Discharge Instructions (Addendum)
I suspect your atrial flutter secondary due to viral gastroenteritis.  Your rhythm was converted to sinus rhythm.  I recommend continuing with your medications as prescribed.  You may feel groggy and jittery for the next couple hours is common after being sedated.  Suspect this will dissipate by tomorrow.  Please abstain from operating heavy machinery or consume alcohol today as you may become drowsy from the etomidate.  You may resume normal activities tomorrow.  I would like you to follow-up with Dr. Marlou Porch for further evaluation possible EP eval.  Please call his office at your earliest convenience.  Come back to the emergency department if you develop chest pain, shortness of breath, severe abdominal pain, uncontrolled nausea, vomiting, diarrhea.

## 2021-01-22 NOTE — ED Triage Notes (Signed)
Pt reports feeling like his heart is racing for the past few days. States he had a stomach virus over the weekend. Hx of a fib with recent cardioversion about 10 months ago, pt is on a blood thinner. HR 170s in triage. Denies chest pain, slight SOB. Pt a.o

## 2021-01-22 NOTE — ED Notes (Signed)
Pt ambulated to the restroom.

## 2021-01-23 NOTE — ED Provider Notes (Signed)
.  Sedation  Date/Time: 01/22/2021 4:08 PM Performed by: Courtney Paris, MD Authorized by: Courtney Paris, MD   Consent:    Consent obtained:  Written   Consent given by:  Patient   Risks discussed:  Respiratory compromise necessitating ventilatory assistance and intubation, prolonged sedation necessitating reversal, allergic reaction, dysrhythmia, inadequate sedation and nausea Universal protocol:    Immediately prior to procedure, a time out was called: yes   Indications:    Procedure performed:  Cardioversion Pre-sedation assessment:    Time since last food or drink:  Hours   ASA classification: class 2 - patient with mild systemic disease     Mallampati score:  I - soft palate, uvula, fauces, pillars visible   Pre-sedation assessments completed and reviewed: airway patency, cardiovascular function, mental status, nausea/vomiting and respiratory function   Procedure details (see MAR for exact dosages):    Total Provider sedation time (minutes):  25 Post-procedure details:    Attendance: Constant attendance by certified staff until patient recovered     Recovery: Patient returned to pre-procedure baseline     Patient is stable for discharge or admission: yes     Procedure completion:  Tolerated well, no immediate complications      Kimley Apsey, Gwenyth Allegra, MD 01/23/21 1611

## 2021-01-25 NOTE — Telephone Encounter (Signed)
The patient presented to the ER 01/22/21 with afib RVR. Dr. Marlou Porch saw the patient and successful DCCV completed. He was discharged in NSR.  [facility] called the patient to schedule follow-up but the patient only wants to see Dr. Marlou Porch. Will send to Dr. Marlou Porch' nurse for potential cancellations.

## 2021-01-29 ENCOUNTER — Other Ambulatory Visit: Payer: Self-pay | Admitting: Cardiology

## 2021-02-01 NOTE — Telephone Encounter (Signed)
Spoke with pt who is aware of the appt he has with Dr Lars Mage 4/7.  He will keep that appt as scheduled and will f/u with Dr Marlou Porch as instructed once seen by Dr Quentin Ore.

## 2021-02-16 ENCOUNTER — Ambulatory Visit: Payer: No Typology Code available for payment source | Admitting: Physician Assistant

## 2021-02-18 ENCOUNTER — Other Ambulatory Visit: Payer: Self-pay

## 2021-02-18 ENCOUNTER — Encounter: Payer: Self-pay | Admitting: Cardiology

## 2021-02-18 ENCOUNTER — Ambulatory Visit (INDEPENDENT_AMBULATORY_CARE_PROVIDER_SITE_OTHER): Payer: No Typology Code available for payment source | Admitting: Cardiology

## 2021-02-18 VITALS — BP 148/90 | HR 78 | Ht 70.0 in | Wt 358.4 lb

## 2021-02-18 DIAGNOSIS — I4892 Unspecified atrial flutter: Secondary | ICD-10-CM | POA: Diagnosis not present

## 2021-02-18 DIAGNOSIS — G4733 Obstructive sleep apnea (adult) (pediatric): Secondary | ICD-10-CM

## 2021-02-18 MED ORDER — METOPROLOL SUCCINATE ER 100 MG PO TB24: 100.0000 mg | ORAL_TABLET | Freq: Every day | ORAL | 3 refills | Status: DC

## 2021-02-18 NOTE — Progress Notes (Signed)
Electrophysiology Office Note:    Date:  02/18/2021   ID:  Henry Dougherty, DOB March 12, 1961, MRN [id number]  PCP:  Shon Baton, MD  [facility] Cardiologist:  Candee Furbish, MD  [facility] Electrophysiologist:  Vickie Epley, MD   Referring MD: Shon Baton, MD   Chief Complaint: Atrial fibrillation  History of Present Illness:    Henry Dougherty is a 60 y.o. male who presents for an evaluation of atrial fibrillation at the request of Dr. Marlou Porch. Their medical history includes mitral valve repair, PFO closure and maze procedure.  The mitral valve repair was prompted by a ruptured chordae.  He is maintained on Pradaxa for his atrial fibrillation.  He also has sleep apnea.  He was seen in the emergency department on January 22, 2021 with atrial flutter with rapid ventricular rates and was cardioverted.  He has sleep apnea and uses CPAP nightly.  He thinks GI illnesses prompted the last 2 episodes of a flutter with RVR requiring cardioversion.  He uses Pradaxa without bleeding issues.  Past Medical History:  Diagnosis Date  . Allergic rhinitis   . Atrial fibrillation with RVR ([place]) 03/2020  . CHF (congestive heart failure) (HCC)    EF 45%/ VALVULAR CM  . DOE (dyspnea on exertion)   . Dyspepsia   . Environmental allergies   . Hypertension   . OA (osteoarthritis)   . Obese   . Preglaucoma   . SOB (shortness of breath)   . Uveitis    AS A TEENAGER    Past Surgical History:  Procedure Laterality Date  . CARDIAC SURGERY    . CARDIOVERSION N/A 04/01/2020   Procedure: CARDIOVERSION;  Surgeon: Geralynn Rile, MD;  Location: [place];  Service: Cardiovascular;  Laterality: N/A;  . COLONOSCOPY     PT WAS [place]- 2-3 [place] ARTHROSCOPY Right 2005  . MITRAL VALVE REPAIR    . SHOULDER ARTHROSCOPY Right 2005  . TEE WITHOUT CARDIOVERSION N/A 04/01/2016   Procedure: TRANSESOPHAGEAL ECHOCARDIOGRAM (TEE);  Surgeon: Jerline Pain, MD;  Location: [place];  Service:  Cardiovascular;  Laterality: N/A;  . TEE WITHOUT CARDIOVERSION N/A 04/01/2020   Procedure: TRANSESOPHAGEAL ECHOCARDIOGRAM (TEE);  Surgeon: Geralynn Rile, MD;  Location: [place];  Service: Cardiovascular;  Laterality: N/A;    Current Medications: Current Meds  Medication Sig  . acetaminophen (TYLENOL) 500 MG tablet Take 1,000 mg by mouth as needed (pain).  . metoprolol succinate (TOPROL-XL) 100 MG 24 hr tablet Take 1 tablet (100 mg total) by mouth daily. Take with or immediately following a meal.  . PRADAXA 150 MG CAPS capsule Take 1 capsule by mouth twice daily  . tamsulosin (FLOMAX) 0.4 MG CAPS capsule Take 0.4 mg by mouth every evening.  . valsartan (DIOVAN) 320 MG tablet Take 1 tablet by mouth once daily     Allergies:   Amiodarone and Cortisone   Social History   Socioeconomic History  . Marital status: Married    Spouse name: Not on file  . Number of children: 3  . Years of education: Not on file  . Highest education level: Not on file  Occupational History  . 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 Smoking status: Never Smoker  . Smokeless tobacco: Never Used  Vaping Use  . Vaping Use: Never used  Substance and Sexual Activity  . Alcohol use: Yes  . Drug use: No  . Sexual activity: Not on file  Other  Topics Concern  . Not on file  Social History Narrative  . Not on file   Social Determinants of Health   Financial Resource Strain: Not on file  Food Insecurity: Not on file  Transportation Needs: Not on file  Physical Activity: Not on file  Stress: Not on file  Social Connections: Not on file     Family History: The patient's family history includes Atrial fibrillation in his mother; Colon cancer in his father; Heart Problems in his father and mother; Heart attack in his maternal grandfather and paternal grandfather; Stroke in his paternal grandfather.  ROS:   Please see the history of present illness.    All other systems reviewed and  are negative.  EKGs/Labs/Other Studies Reviewed:    The following studies were reviewed today:  Apr 01, 2020 TEE personally reviewed Post mitral valve repair  EKG:  The ekg ordered today demonstrates ectopic atrial rhythm  Recent Labs: 03/30/2020: TSH 1.459 01/22/2021: ALT 37; BUN 16; Creatinine, Ser 1.59; Hemoglobin 16.8; Magnesium 1.7; Platelets 300; Potassium 4.5; Sodium 137  Recent Lipid Panel No results found for: CHOL, TRIG, HDL, CHOLHDL, VLDL, LDLCALC, LDLDIRECT  Physical Exam:    VS:  BP (!) 148/90   Pulse 78   Ht 5\' 10"  (1.778 m)   Wt (!) 358 lb 6.4 oz (162.6 kg)   SpO2 97%   BMI 51.43 kg/m     Wt Readings from Last 3 Encounters:  02/18/21 (!) 358 lb 6.4 oz (162.6 kg)  07/09/20 (!) 351 lb (159.2 kg)  05/29/20 (!) 346 lb (156.9 kg)     GEN:  Well nourished, well developed in no acute distress. Morbidly obese HEENT: Normal NECK: No JVD; No carotid bruits LYMPHATICS: No lymphadenopathy CARDIAC: RRR, no murmurs, rubs, gallops RESPIRATORY:  Clear to auscultation without rales, wheezing or rhonchi  ABDOMEN: Soft, non-tender, non-distended MUSCULOSKELETAL:  No edema; No deformity  SKIN: Warm and dry NEUROLOGIC:  Alert and oriented x 3 PSYCHIATRIC:  Normal affect   ASSESSMENT:    1. Atrial flutter with rapid ventricular response ([place])   2. OSA (obstructive sleep apnea)   3. Morbid obesity ([place])    PLAN:    In order of problems listed above:  1. Symptomatic paroxysmal atrial flutter with rapid ventricular rates 2 episodes in the last year requiring cardioversion.  On Pradaxa for stroke prophylaxis.  We discussed management options for his symptomatic paroxysmal atrial flutter during today's clinic appointment including ablation and antiarrhythmic therapy.  Antiarrhythmic options would include dose adjusted dofetilide and amiodarone.  We discussed the pros and cons of each of these medications.  We also discussed ablation management.  For now he is not a candidate  for ablation given his weight.  We discussed this in detail during today's clinic appointment given the link between obesity and ablation success rates.  For now he would like to try some lifestyle modifications and an increased dose of metoprolol to see if that helps prevent another episode.  If he has more episodes in the future, he will reach out to [place] to discuss starting an antiarrhythmic medication.  I would favor starting with dofetilide given his young age.  We will increase his metoprolol to 100 mg by mouth daily.  2. obstructive sleep apnea on CPAP Uses CPAP nightly.  We discussed the link between sleep apnea and atrial arrhythmias during today's clinic appointment.  3.  Morbid obesity Discussed the link between obesity and atrial arrhythmias.  He is working on lifestyle modifications.  Follow-up 6 months   Medication Adjustments/Labs and Tests Ordered: Current medicines are reviewed at length with the patient today.  Concerns regarding medicines are outlined above.  Orders Placed This Encounter  Procedures  . EKG 12-Lead   Meds ordered this encounter  Medications  . metoprolol succinate (TOPROL-XL) 100 MG 24 hr tablet    Sig: Take 1 tablet (100 mg total) by mouth daily. Take with or immediately following a meal.    Dispense:  90 tablet    Refill:  3     Signed, Lars Mage, MD, [facility]  02/18/2021 10:04 AM    Electrophysiology [facility]

## 2021-02-18 NOTE — Patient Instructions (Addendum)
Medication Instructions:  Your physician has recommended you make the following change in your medication:  Increase your Toprol XL to 100 mg by mouth once a day  *If you need a refill on your cardiac medications before your next appointment, please call your pharmacy*  Lab Work: None ordered. If you have labs (blood work) drawn today and your tests are completely normal, you will receive your results only by: Marland Kitchen MyChart Message (if you have MyChart) OR . A paper copy in the mail If you have any lab test that is abnormal or we need to change your treatment, we will call you to review the results.  Testing/Procedures: None ordered.  Follow-Up: At [facility], you and your health needs are our priority.  As part of our continuing mission to provide you with exceptional heart care, we have created designated Provider Care Teams.  These Care Teams include your primary Cardiologist (physician) and Advanced Practice Providers (APPs -  Physician Assistants and Nurse Practitioners) who all work together to provide you with the care you need, when you need it.  Your next appointment:   Your physician wants you to follow-up in: 6 months with Dr. Quentin Ore.   You will receive a reminder letter in the mail two months in advance. If you don't receive a letter, please call our office to schedule the follow-up appointment.

## 2021-04-21 MED ORDER — METOPROLOL SUCCINATE ER 50 MG PO TB24: 75.0000 mg | ORAL_TABLET | Freq: Every day | ORAL | 0 refills | Status: DC

## 2021-04-27 ENCOUNTER — Other Ambulatory Visit: Payer: Self-pay | Admitting: Cardiology

## 2021-04-28 NOTE — Telephone Encounter (Signed)
Pt last saw Dr Quentin Ore 02/18/21, last labs 01/22/21 Creat 1.59, age 60, weight 162.6kg, CrCl 112.21, based on CrCl pt is on appropriate dosage of Pradaxa 150mg  BID.  Will refill rx.

## 2021-05-20 ENCOUNTER — Other Ambulatory Visit: Payer: Self-pay | Admitting: Cardiology

## 2021-06-15 ENCOUNTER — Other Ambulatory Visit: Payer: Self-pay | Admitting: Cardiology

## 2021-06-17 ENCOUNTER — Other Ambulatory Visit: Payer: Self-pay | Admitting: Cardiology

## 2021-06-17 ENCOUNTER — Other Ambulatory Visit: Payer: Self-pay | Admitting: *Deleted

## 2021-06-17 MED ORDER — METOPROLOL SUCCINATE ER 50 MG PO TB24: 75.0000 mg | ORAL_TABLET | Freq: Every day | ORAL | 1 refills | Status: DC

## 2021-07-13 ENCOUNTER — Telehealth: Payer: Self-pay | Admitting: Cardiology

## 2021-07-13 NOTE — Telephone Encounter (Signed)
Pt takes Pradaxa wife is wanting to know if he should hold it before having tooth extraction.  Will have Dr Marlou Porch review and call back with recommendations.

## 2021-07-13 NOTE — Telephone Encounter (Signed)
Patient's wife states the patient is out of town and he broke a tooth. He has been scheduled to have emergency dental work (1 tooth extraction) on 07/16/21. However, she has concerns with this due to patient being on a blood thinner. She would like to know if he needs to hold the blood thinner and/or pre-med? She states the office performing the dental work isn't requesting clearance, she just wants to know what Dr. Marlou Porch recommends. Please advise.

## 2021-07-14 NOTE — Telephone Encounter (Signed)
Called pt left a detailed message okay per DPR.  Told pt that MD recommends pt not hold Xarelto for 1 tooth extraction.  Told to call the office with any questions or concerns.

## 2021-07-14 NOTE — Telephone Encounter (Signed)
//  Wife calling in to see if pt needs an antibiotic p/rior to dental work. She does not know what type of dental work pt will need.  Dental work will be Friday 07/16/2021.  Will route to MD to address.

## 2021-07-14 NOTE — Telephone Encounter (Signed)
Wife of the patient called. She was extremely upset that no one called her back yesterday.  The patient's wife needs to know if the patient needs to hold the blood thinner for a tooth extraction. The patient is scheduled for a procedure Friday and it is imperative she know ASAP.

## 2021-07-15 MED ORDER — AMOXICILLIN 500 MG PO CAPS: 2000.0000 mg | ORAL_CAPSULE | Freq: Once | ORAL | 3 refills | Status: DC

## 2021-07-15 NOTE — Telephone Encounter (Signed)
Please take amoxicillin 2g PO 30-60 minutes prior to dental procedure. He has had prior mitral valve repair.   Candee Furbish, MD    Pt's wife aware of the above information.  RX sent electronically into pharmacy of choice.

## 2021-07-15 NOTE — Addendum Note (Signed)
Addended by: Shellia Cleverly on: 07/15/2021 09:12 AM   Modules accepted: Orders

## 2021-07-19 ENCOUNTER — Other Ambulatory Visit: Payer: Self-pay | Admitting: Cardiology

## 2021-12-16 ENCOUNTER — Other Ambulatory Visit: Payer: Self-pay | Admitting: Cardiology

## 2021-12-27 ENCOUNTER — Other Ambulatory Visit: Payer: Self-pay | Admitting: *Deleted

## 2021-12-27 MED ORDER — DABIGATRAN ETEXILATE MESYLATE 150 MG PO CAPS: 150.0000 mg | ORAL_CAPSULE | Freq: Two times a day (BID) | ORAL | 1 refills | Status: DC

## 2021-12-27 NOTE — Telephone Encounter (Signed)
Hx: Aflutter, OVQuentin Dougherty 02/18/2021, Scr: 1.59 (01/22/2021), Weight: 162.6 kg,Age: 61 CrCl: 160ml/min. Refill sent.

## 2021-12-29 ENCOUNTER — Telehealth: Payer: Self-pay

## 2021-12-29 NOTE — Telephone Encounter (Signed)
**Note De-Identified Ricarda Atayde Obfuscation** Letter received from [employer] stating that a Pradaxa PA is not required as it is covered by the pts plan.  I have notified Adrian, [place] of this approval.

## 2021-12-29 NOTE — Telephone Encounter (Signed)
**Note De-Identified Maame Dack Obfuscation** Pradaxa PA started through [email or web address]  PA Information Case Id [id number] Reference Id [id number]

## 2021-12-30 ENCOUNTER — Encounter: Payer: Self-pay | Admitting: Gastroenterology

## 2022-01-19 ENCOUNTER — Ambulatory Visit: Payer: No Typology Code available for payment source | Admitting: Gastroenterology

## 2022-01-31 ENCOUNTER — Ambulatory Visit (INDEPENDENT_AMBULATORY_CARE_PROVIDER_SITE_OTHER): Payer: No Typology Code available for payment source | Admitting: Gastroenterology

## 2022-01-31 ENCOUNTER — Encounter: Payer: Self-pay | Admitting: Gastroenterology

## 2022-01-31 VITALS — BP 180/80 | HR 73 | Ht 70.0 in | Wt 357.0 lb

## 2022-01-31 DIAGNOSIS — Z9229 Personal history of other drug therapy: Secondary | ICD-10-CM

## 2022-01-31 DIAGNOSIS — Z1211 Encounter for screening for malignant neoplasm of colon: Secondary | ICD-10-CM | POA: Diagnosis not present

## 2022-01-31 DIAGNOSIS — Z800 Family history of malignant neoplasm of digestive organs: Secondary | ICD-10-CM | POA: Diagnosis not present

## 2022-01-31 NOTE — Patient Instructions (Signed)
If you are age 63 or older, your body mass index should be between 23-30. Your Body mass index is 51.22 kg/m?Marland Kitchen If this is out of the aforementioned range listed, please consider follow up with your Primary Care Provider. ? ?If you are age 37 or younger, your body mass index should be between 19-25. Your Body mass index is 51.22 kg/m?Marland Kitchen If this is out of the aformentioned range listed, please consider follow up with your Primary Care Provider.  ? ?We will call to schedule hospital Colonoscopy when schedule comes out. ? ?The [place] [facility] providers would like to encourage you to use [facility] to communicate with providers for non-urgent requests or questions.  Due to long hold times on the telephone, sending your provider a message by [facility] may be a faster and more efficient way to get a response.  Please allow 48 business hours for a response.  Please remember that this is for non-urgent requests.  ? ?It was a pleasure to see you today! ? ?Thank you for trusting me with your gastrointestinal care!   ? ?Scott E.Candis Schatz, MD  ? ?

## 2022-01-31 NOTE — Progress Notes (Signed)
? ?HPI : Henry Dougherty is a very pleasant 61 year old male with morbid obesity and a history of A-fib and CHF who is referred to [place] by Dr. Shon Baton for surveillance colonoscopy.  He also has a history of mitral valve repair, PFO closure and maze procedure 2017.  He was admitted to the hospital in March of last year with a flutter with RVR and underwent cardioversion.  This was in the setting of an acute GI illness.  He takes Pradaxa for his atrial fibrillation and takes metoprolol for rate control.  He has a follow up appointment with his cardiologist in the next 1-2 months.  He has mild exertional dyspnea but otherwise denies any concerning cardiopulmonary symptoms such as palpitations, light-headedness/dizziness, chest pain/pressure.  His most recent echocardiogram in May 2021 showed a mildly reduced EF (45 to 50%). ?He has occasional loose stools but otherwise no chronic GI symptoms.  Occasionally will have scant amounts of bright red blood on the toilet paper. ?He thinks his father had localized colon cancer diagnosed in his mid-late 75s.   ? ?He reports having a colonoscopy at age 49 in [place], and thinks that a few small polyps were removed. ? ?Past Medical History:  ?Diagnosis Date  ? Allergic rhinitis   ? Atrial fibrillation with RVR ([place]) 03/2020  ? CHF (congestive heart failure) ([place])   ? EF 45%/ VALVULAR CM  ? DOE (dyspnea on exertion)   ? Dyspepsia   ? Environmental allergies   ? Hypertension   ? OA (osteoarthritis)   ? Obese   ? Preglaucoma   ? SOB (shortness of breath)   ? Uveitis   ? AS A TEENAGER  ? ? ? ?Past Surgical History:  ?Procedure Laterality Date  ? CARDIAC SURGERY    ? CARDIOVERSION N/A 04/01/2020  ? Procedure: CARDIOVERSION;  Surgeon: Geralynn Rile, MD;  Location: [place];  Service: Cardiovascular;  Laterality: N/A;  ? COLONOSCOPY    ? PT WAS [place]- 2-3 [place]  ? [place] ARTHROSCOPY Right 2005  ? MITRAL VALVE REPAIR    ? SHOULDER ARTHROSCOPY Right 2005  ? TEE WITHOUT  CARDIOVERSION N/A 04/01/2016  ? Procedure: TRANSESOPHAGEAL ECHOCARDIOGRAM (TEE);  Surgeon: Jerline Pain, MD;  Location: [place];  Service: Cardiovascular;  Laterality: N/A;  ? TEE WITHOUT CARDIOVERSION N/A 04/01/2020  ? Procedure: TRANSESOPHAGEAL ECHOCARDIOGRAM (TEE);  Surgeon: Geralynn Rile, MD;  Location: [place];  Service: Cardiovascular;  Laterality: N/A;  ? ?Family History  ?Problem Relation Age of Onset  ? Heart Problems Mother   ? Atrial fibrillation Mother   ? Colon cancer Father   ? Heart Problems Father   ? Heart attack Maternal Grandfather   ?     died from heart attack in his 68's  ? Heart attack Paternal Grandfather   ? Stroke Paternal Grandfather   ? ?Social History  ? ?Tobacco Use  ? Smoking status: Never  ? Smokeless tobacco: Never  ?Vaping Use  ? Vaping Use: Never used  ?Substance Use Topics  ? Alcohol use: Yes  ? Drug use: No  ? ?Current Outpatient Medications  ?Medication Sig Dispense Refill  ? acetaminophen (TYLENOL) 500 MG tablet Take 1,000 mg by mouth as needed (pain).    ? dabigatran (PRADAXA) 150 MG CAPS capsule Take 1 capsule (150 mg total) by mouth 2 (two) times daily. 180 capsule 1  ? metoprolol succinate (TOPROL-XL) 50 MG 24 hr tablet TAKE 1 & 1/2 (ONE & ONE-HALF) TABLETS BY  MOUTH ONCE DAILY. TAKE WITH OR IMMEDIATELY FOLLOWING A MEAL 45 tablet 0  ? tamsulosin (FLOMAX) 0.4 MG CAPS capsule Take 0.4 mg by mouth every evening.    ? valsartan (DIOVAN) 320 MG tablet Take 1 tablet by mouth once daily 90 tablet 2  ? ?No current facility-administered medications for this visit.  ? ?Allergies  ?Allergen Reactions  ? Amiodarone Other (See Comments)  ?  Blisters and whelps to IV FORM.  ? Cortisone   ?  Glaucoma HX ,  Triggers increased pressure in eye  ? ? ? ?Review of Systems: ?All systems reviewed and negative except where noted in HPI.  ? ? ?No results found. ? ?Physical Exam: ?There were no vitals taken for this visit. ?Constitutional: Pleasant,well-developed, obese male in no  acute distress. ?HEENT: Normocephalic and atraumatic. Conjunctivae are normal. No scleral icterus. ?Neck supple.  ?Cardiovascular: Normal rate, regular rhythm.  ?Pulmonary/chest: Effort normal and breath sounds normal. No wheezing, rales or rhonchi. ?Abdominal: Soft, nondistended, nontender. Bowel sounds active throughout. There are no masses palpable. No hepatomegaly. ?Extremities: no edema ?Neurological: Alert and oriented to person place and time. ?Skin: Skin is warm and dry. No rashes noted. ?Psychiatric: Normal mood and affect. Behavior is normal. ? ?CBC ?   ?Component Value Date/Time  ? WBC 7.5 01/22/2021 0931  ? RBC 5.16 01/22/2021 0931  ? HGB 16.8 01/22/2021 0931  ? HGB 17.1 04/09/2020 1455  ? HCT 49.8 01/22/2021 0931  ? HCT 49.1 04/09/2020 1455  ? PLT 300 01/22/2021 0931  ? PLT 313 04/09/2020 1455  ? MCV 96.5 01/22/2021 0931  ? MCV 94 04/09/2020 1455  ? MCH 32.6 01/22/2021 0931  ? MCHC 33.7 01/22/2021 0931  ? RDW 12.6 01/22/2021 0931  ? RDW 12.6 04/09/2020 1455  ? LYMPHSABS 1.3 01/22/2021 0931  ? MONOABS 0.6 01/22/2021 0931  ? EOSABS 0.2 01/22/2021 0931  ? BASOSABS 0.1 01/22/2021 0931  ? ? ?CMP  ?   ?Component Value Date/Time  ? NA 137 01/22/2021 0931  ? NA 139 05/07/2020 1156  ? K 4.5 01/22/2021 0931  ? CL 102 01/22/2021 0931  ? CO2 23 01/22/2021 0931  ? GLUCOSE 184 (H) 01/22/2021 0931  ? BUN 16 01/22/2021 0931  ? BUN 15 05/07/2020 1156  ? CREATININE 1.59 (H) 01/22/2021 0931  ? CREATININE 2.19 (H) 04/13/2016 1048  ? CALCIUM 9.1 01/22/2021 0931  ? PROT 6.3 (L) 01/22/2021 0931  ? ALBUMIN 3.7 01/22/2021 0931  ? AST 21 01/22/2021 0931  ? ALT 37 01/22/2021 0931  ? ALKPHOS 41 01/22/2021 0931  ? BILITOT 1.0 01/22/2021 0931  ? GFRNONAA 49 (L) 01/22/2021 0931  ? GFRAA 71 05/07/2020 1156  ? ? ? ?ASSESSMENT AND PLAN: ?61 year old with morbid obesity (BMI 51), a-fib with recurrent provoked RVR episodes, and mild CHF due for colonoscopy.  Last colonoscopy >10 years ago and patient believes that polyps were removed.  He  thinks his father was diagnosed with localized colon cancer in his 73s.  He is on Pradaxa for anticoagulation.  No concerning GI symptoms.  Will schedule patient for routine screening/surveillance colonoscopy.  Due to his BMI, the procedure will need to be performed in the hospital.  Will plan to hold Pradaxa for 2 days prior to his procedure.  Will contact his cardiologist to ensure this is a reasonable risk. ? ?Colon cancer screening, increased risk ?- Hospital-based colonoscopy (BMI>50) ?- Hold pradaxa 2 days prior  ? ?The details, risks (including bleeding, perforation, infection, missed lesions, medication  reactions and possible hospitalization or surgery if complications occur), benefits, and alternatives to colonoscopy with possible biopsy and possible polypectomy were discussed with the patient and he consents to proceed.  ? ?Annely Sliva E. Candis Schatz, MD ?[facility] ? ?CC:  Shon Baton, MD ? ?

## 2022-03-17 ENCOUNTER — Other Ambulatory Visit: Payer: Self-pay | Admitting: Cardiology

## 2022-03-31 ENCOUNTER — Telehealth: Payer: Self-pay | Admitting: Gastroenterology

## 2022-03-31 NOTE — Telephone Encounter (Signed)
Maya do you know if this pt is on the waitlist?

## 2022-03-31 NOTE — Telephone Encounter (Signed)
Inbound call from patient stating he needed to be scheduled for a colonoscopy at the hospital. Requesting a call back to discuss. Please advise.

## 2022-03-31 NOTE — Telephone Encounter (Signed)
Spoke with pt and let him know that he is on the waiting list for the hospital procedure. He verbalized understanding and knows he will be contacted when we have an opening.

## 2022-04-04 IMAGING — DX DG CHEST 1V PORT
1 series · 2 of 2 positions shown · non-contrast
Comparison: 04/05/2016.

CLINICAL DATA: Short of breath.  Palpitations.

EXAM:
PORTABLE CHEST 1 VIEW

[Series 1: chest · 0.14mm/px · 2 of 2 slices shown]
[im 1/2]
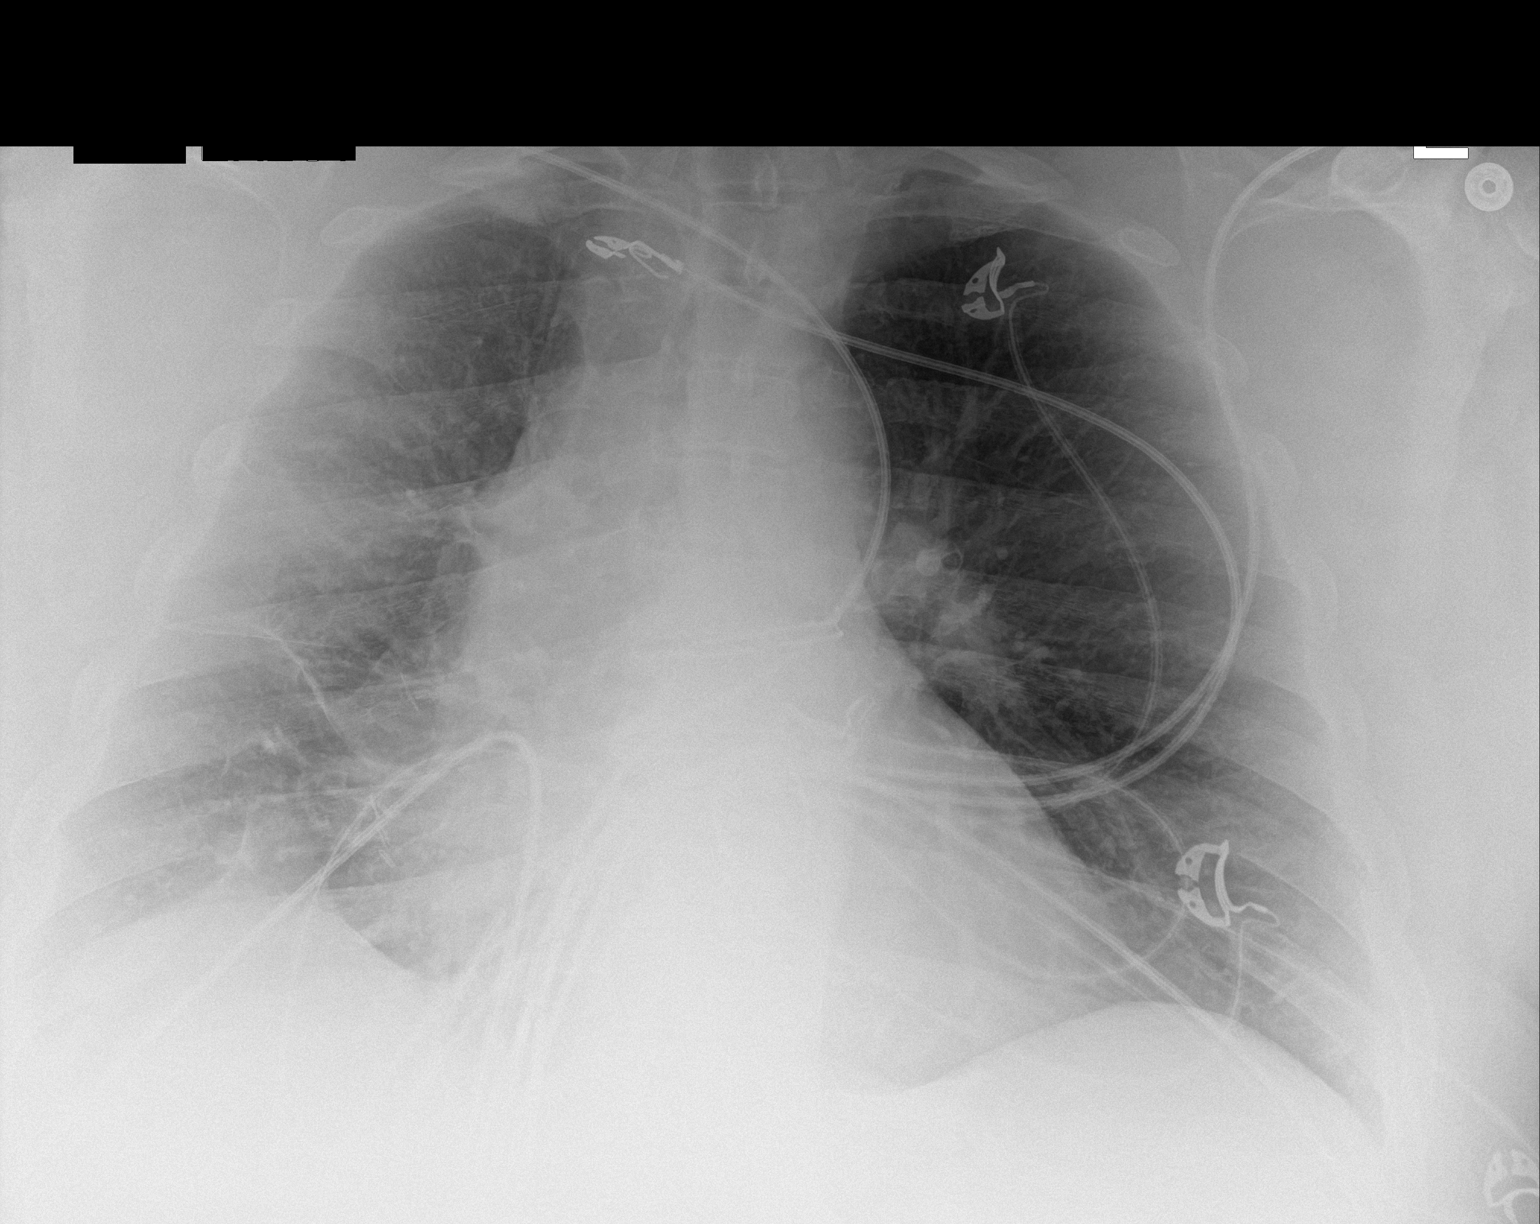
[im 2/2]
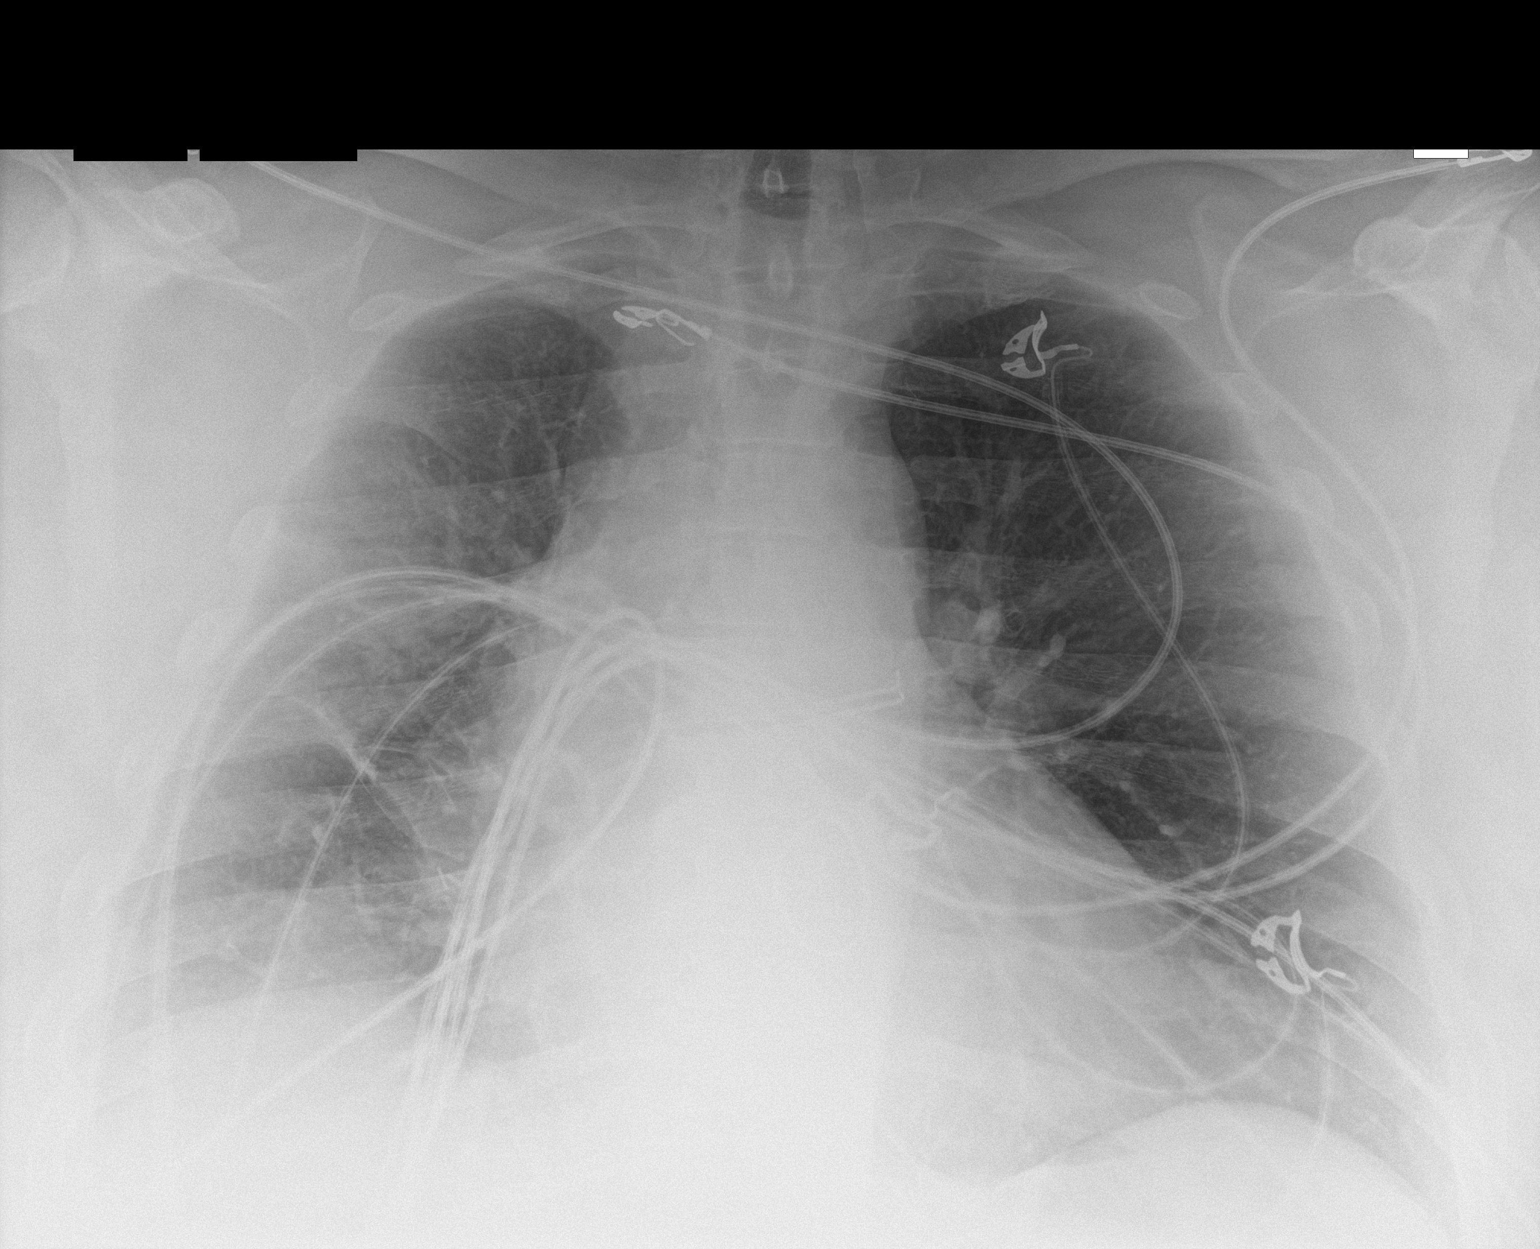

[2 of 2 positions shown; findings below may reference images not displayed]

FINDINGS: Cardiac silhouette is mildly enlarged. There are changes from
previous valve replacement, stable. No mediastinal or hilar masses.

Mild hazy opacity is noted in the right mid to lower lung with
associated linear opacities. Several surgical vascular clips project
over the right heart border. Subtle pulmonary anastomosis staple
line noted in the inferior right lung. Right upper lung is clear.
Left lung is clear.

No convincing pleural effusion and no pneumothorax.

Skeletal structures are grossly intact.
IMPRESSION: 1. No acute cardiopulmonary disease.
2. Chronic lower lung surgical changes on the right.

## 2022-04-11 ENCOUNTER — Other Ambulatory Visit: Payer: Self-pay | Admitting: Cardiology

## 2022-04-14 ENCOUNTER — Telehealth: Payer: Self-pay

## 2022-04-14 ENCOUNTER — Encounter (HOSPITAL_COMMUNITY): Payer: Self-pay | Admitting: Gastroenterology

## 2022-04-14 ENCOUNTER — Other Ambulatory Visit: Payer: Self-pay

## 2022-04-14 DIAGNOSIS — Z1211 Encounter for screening for malignant neoplasm of colon: Secondary | ICD-10-CM

## 2022-04-14 NOTE — Addendum Note (Signed)
Addended by: Darrall Dears on: 04/14/2022 09:02 AM   Modules accepted: Orders

## 2022-04-14 NOTE — Telephone Encounter (Signed)
[facility] Pre-operative Risk Assessment     Request for surgical clearance:     Endoscopy Procedure  What type of surgery is being performed?     Colonoscopy   When is this surgery scheduled?    04/21/22  What type of clearance is required ?   Pharmacy  Are there any medications that need to be held prior to surgery and how long? Pradaxa 2 days  Practice name and name of physician performing surgery?      [facility]  What is your office phone and fax number?      Phone- [phone]  [phone]  Anesthesia type (None, local, MAC, general) ?       MAC

## 2022-04-14 NOTE — Telephone Encounter (Signed)
Clinical pharmacist to review Pradaxa

## 2022-04-15 ENCOUNTER — Telehealth: Payer: Self-pay | Admitting: Cardiology

## 2022-04-15 MED ORDER — METOPROLOL SUCCINATE ER 50 MG PO TB24: ORAL_TABLET | ORAL | 1 refills | Status: DC

## 2022-04-15 NOTE — Telephone Encounter (Signed)
Pt's medication was sent to pt's pharmacy as requested. Confirmation received.  °

## 2022-04-15 NOTE — Telephone Encounter (Signed)
*  STAT* If patient is at the pharmacy, call can be transferred to refill team.   1. Which medications need to be refilled? (please list name of each medication and dose if known) metoprolol succinate (TOPROL-XL) 50 MG 24 hr tablet  2. Which pharmacy/location (including street and city if local pharmacy) is medication to be sent to? [place], [place]  3. Do they need a 30 day or 90 day supply? 60 days  Patient is scheduled to see Dr. Marlou Porch 06/16/22

## 2022-04-15 NOTE — Telephone Encounter (Signed)
Patient with diagnosis of afib on Pradaxa for anticoagulation.    Procedure: colonoscopy Date of procedure: 04/21/22   CHA2DS2-VASc Score = 2   This indicates a 2.2% annual risk of stroke. The patient's score is based upon: CHF History: 1 HTN History: 1 Diabetes History: 0 Stroke History: 0 Vascular Disease History: 0 Age Score: 0 Gender Score: 0      CrCl 98 ml/min  Per office protocol, patient can hold Pradaxa for 2 days prior to procedure.

## 2022-04-15 NOTE — Telephone Encounter (Signed)
    Patient Name: Henry Dougherty  DOB: 10-24-1961 MRN: [id number]  Primary Cardiologist: Candee Furbish, MD  Per office protocol, patient can hold Pradaxa for 2 days prior to procedure.    I will route this recommendation to the requesting party via Epic fax function and remove from pre-op pool.   Please call with questions.  Mable Fill, Marissa Nestle, NP 04/15/2022, 2:17 PM

## 2022-04-19 NOTE — Telephone Encounter (Signed)
Left VM for patient to call back and let me know that he received message about holding his Pradaxa 2 days prior to their procedure.

## 2022-04-21 ENCOUNTER — Encounter (HOSPITAL_COMMUNITY): Payer: Self-pay | Admitting: Gastroenterology

## 2022-04-21 ENCOUNTER — Ambulatory Visit (HOSPITAL_COMMUNITY): Payer: No Typology Code available for payment source | Admitting: Anesthesiology

## 2022-04-21 ENCOUNTER — Ambulatory Visit (HOSPITAL_BASED_OUTPATIENT_CLINIC_OR_DEPARTMENT_OTHER): Payer: No Typology Code available for payment source | Admitting: Anesthesiology

## 2022-04-21 ENCOUNTER — Other Ambulatory Visit: Payer: Self-pay

## 2022-04-21 ENCOUNTER — Encounter (HOSPITAL_COMMUNITY)
Admission: RE | Disposition: A | Discharge status: Home / Self Care | Payer: Self-pay | Source: Home / Self Care | Attending: Gastroenterology

## 2022-04-21 ENCOUNTER — Ambulatory Visit (HOSPITAL_COMMUNITY)
Admission: RE | Admit: 2022-04-21 | Discharge: 2022-04-21 | Disposition: A | Discharge status: Home / Self Care | Payer: No Typology Code available for payment source | Attending: Gastroenterology | Admitting: Gastroenterology

## 2022-04-21 DIAGNOSIS — D123 Benign neoplasm of transverse colon: Secondary | ICD-10-CM | POA: Insufficient documentation

## 2022-04-21 DIAGNOSIS — Z79899 Other long term (current) drug therapy: Secondary | ICD-10-CM | POA: Diagnosis not present

## 2022-04-21 DIAGNOSIS — Z1211 Encounter for screening for malignant neoplasm of colon: Secondary | ICD-10-CM | POA: Diagnosis not present

## 2022-04-21 DIAGNOSIS — Z800 Family history of malignant neoplasm of digestive organs: Secondary | ICD-10-CM | POA: Insufficient documentation

## 2022-04-21 DIAGNOSIS — Z6841 Body Mass Index (BMI) 40.0 and over, adult: Secondary | ICD-10-CM | POA: Diagnosis not present

## 2022-04-21 DIAGNOSIS — D120 Benign neoplasm of cecum: Secondary | ICD-10-CM | POA: Insufficient documentation

## 2022-04-21 DIAGNOSIS — K5730 Diverticulosis of large intestine without perforation or abscess without bleeding: Secondary | ICD-10-CM | POA: Diagnosis not present

## 2022-04-21 DIAGNOSIS — I110 Hypertensive heart disease with heart failure: Secondary | ICD-10-CM | POA: Insufficient documentation

## 2022-04-21 DIAGNOSIS — I509 Heart failure, unspecified: Secondary | ICD-10-CM | POA: Insufficient documentation

## 2022-04-21 DIAGNOSIS — D122 Benign neoplasm of ascending colon: Secondary | ICD-10-CM | POA: Diagnosis not present

## 2022-04-21 DIAGNOSIS — K635 Polyp of colon: Secondary | ICD-10-CM | POA: Diagnosis not present

## 2022-04-21 DIAGNOSIS — D126 Benign neoplasm of colon, unspecified: Secondary | ICD-10-CM | POA: Diagnosis not present

## 2022-04-21 HISTORY — PX: COLONOSCOPY WITH PROPOFOL: SHX5780

## 2022-04-21 HISTORY — PX: POLYPECTOMY: SHX5525

## 2022-04-21 SURGERY — COLONOSCOPY WITH PROPOFOL
Anesthesia: Monitor Anesthesia Care

## 2022-04-21 MED ORDER — PROPOFOL 10 MG/ML IV BOLUS
INTRAVENOUS | Status: AC
  Filled 2022-04-21: qty 20

## 2022-04-21 MED ORDER — LACTATED RINGERS IV SOLN
INTRAVENOUS | Status: DC
  Administered 2022-04-21: 1000 mL via INTRAVENOUS

## 2022-04-21 MED ORDER — DEXMEDETOMIDINE (PRECEDEX) IN NS 20 MCG/5ML (4 MCG/ML) IV SYRINGE
PREFILLED_SYRINGE | INTRAVENOUS | Status: DC | PRN
  Administered 2022-04-21: 16 ug via INTRAVENOUS

## 2022-04-21 MED ORDER — PROPOFOL 500 MG/50ML IV EMUL
INTRAVENOUS | Status: DC | PRN
  Administered 2022-04-21: 120 ug/kg/min via INTRAVENOUS

## 2022-04-21 MED ORDER — PROPOFOL 10 MG/ML IV BOLUS
INTRAVENOUS | Status: DC | PRN
  Administered 2022-04-21: 10 mg via INTRAVENOUS
  Administered 2022-04-21 (×2): 20 mg via INTRAVENOUS

## 2022-04-21 SURGICAL SUPPLY — 22 items

## 2022-04-21 NOTE — H&P (Signed)
[facility] History and Physical   Primary Care Physician:  Shon Baton, MD   Reason for Procedure:   Colon cancer screening, high risk  Plan:    Colonoscopy     HPI: Henry Dougherty is a 61 y.o. male undergoing high risk screening colonoscopy.  His father was diagnosed with colon cancer in his 77s.  He had a colonoscopy >10 years and thinks that some polyps were removed.  He denies any chronic GI symptoms.    Past Medical History:  Diagnosis Date   Allergic rhinitis    Atrial fibrillation with RVR ([place]) 03/2020   CHF (congestive heart failure) (HCC)    EF 45%/ VALVULAR CM   DOE (dyspnea on exertion)    Dyspepsia    Environmental allergies    Hypertension    OA (osteoarthritis)    Obese    Preglaucoma    SOB (shortness of breath)    Uveitis    AS A TEENAGER    Past Surgical History:  Procedure Laterality Date   CARDIAC SURGERY     CARDIOVERSION N/A 04/01/2020   Procedure: CARDIOVERSION;  Surgeon: Geralynn Rile, MD;  Location: [place];  Service: Cardiovascular;  Laterality: N/A;   COLONOSCOPY     PT WAS [place]- 2-3 [place] ARTHROSCOPY Right 2005   MITRAL VALVE REPAIR     SHOULDER ARTHROSCOPY Right 2005   TEE WITHOUT CARDIOVERSION N/A 04/01/2016   Procedure: TRANSESOPHAGEAL ECHOCARDIOGRAM (TEE);  Surgeon: Jerline Pain, MD;  Location: [place];  Service: Cardiovascular;  Laterality: N/A;   TEE WITHOUT CARDIOVERSION N/A 04/01/2020   Procedure: TRANSESOPHAGEAL ECHOCARDIOGRAM (TEE);  Surgeon: Geralynn Rile, MD;  Location: [place];  Service: Cardiovascular;  Laterality: N/A;    Prior to Admission medications   Medication Sig Start Date End Date Taking? Authorizing Provider  acetaminophen (TYLENOL) 500 MG tablet Take 1,000 mg by mouth every 6 (six) hours as needed for moderate pain or mild pain.   Yes [provider]  dabigatran (PRADAXA) 150 MG CAPS capsule Take 1 capsule (150 mg total) by mouth 2 (two) times  daily. 12/27/21  Yes Vickie Epley, MD  metoprolol succinate (TOPROL-XL) 50 MG 24 hr tablet TAKE 1 & 1/2 (ONE & ONE-HALF) TABLETS BY MOUTH ONCE DAILY. TAKE WITH OR IMMEDIATELY FOLLOWING A MEAL 04/15/22  Yes Jerline Pain, MD  valsartan (DIOVAN) 320 MG tablet Take 1 tablet by mouth once daily 07/20/21  Yes Jerline Pain, MD    Current Facility-Administered Medications  Medication Dose Route Frequency Provider Last Rate Last Admin   lactated ringers infusion   Intravenous Continuous Daryel November, MD 10 mL/hr at 04/21/22 1036 Restarted at 04/21/22 1128   Facility-Administered Medications Ordered in Other Encounters  Medication Dose Route Frequency Provider Last Rate Last Admin   propofol (DIPRIVAN) 10 mg/mL bolus/IV push   Intravenous Anesthesia Intra-op Niel Hummer, CRNA   10 mg at 04/21/22 1134   propofol (DIPRIVAN) 500 MG/50ML infusion   Intravenous Continuous PRN Niel Hummer, CRNA [phone] mL/hr at 04/21/22 1130 120 mcg/kg/min at 04/21/22 1130    Allergies as of 04/14/2022 - Review Complete 04/14/2022  Allergen Reaction Noted   Amiodarone Other (See Comments) 03/29/2016   Cortisone  03/30/2020    Family History  Problem Relation Age of Onset   Heart Problems Mother    Atrial fibrillation Mother    Colon cancer Father    Heart Problems Father    Heart attack  Maternal Grandfather        died from heart attack in his 71's   Heart attack Paternal Grandfather    Stroke Paternal Grandfather    Pancreatic cancer Neg Hx    Esophageal cancer Neg Hx    Liver cancer Neg Hx    Stomach cancer Neg Hx     Social History   Socioeconomic History   Marital status: Married    Spouse name: Not on file   Number of children: 3   Years of education: Not on file   Highest education level: Not on file  Occupational History   Occupation: VP FOR [employer]  Tobacco Use   Smoking status: Never   Smokeless tobacco: Never  Vaping Use   Vaping Use: Never used   Substance and Sexual Activity   Alcohol use: Yes   Drug use: No   Sexual activity: Not on file  Other Topics Concern   Not on file  Social History Narrative   Not on file   Social Determinants of Health   Financial Resource Strain: Not on file  Food Insecurity: Not on file  Transportation Needs: Not on file  Physical Activity: Not on file  Stress: Not on file  Social Connections: Not on file  Intimate Partner Violence: Not on file    Review of Systems:  All other review of systems negative except as mentioned in the HPI.  Physical Exam: Vital signs BP (!) 199/93   Pulse 85   Temp 97.6 F (36.4 C) (Temporal)   Resp 17   Ht '5\' 9"'$  (1.753 m)   Wt (!) 158.3 kg   SpO2 100%   BMI 51.54 kg/m   General:   Alert,  Well-developed, well-nourished, pleasant and cooperative in NAD Airway:  Mallampati 2 Lungs:  Clear throughout to auscultation.   Heart:  Regular rate and rhythm; no murmurs, clicks, rubs,  or gallops. Abdomen:  Soft, nontender and nondistended. Normal bowel sounds.   Neuro/Psych:  Normal mood and affect. A and O x 3   Udell Mazzocco E. Candis Schatz, MD [facility]

## 2022-04-21 NOTE — Anesthesia Postprocedure Evaluation (Signed)
Anesthesia Post Note  Patient: Henry Dougherty  Procedure(s) Performed: COLONOSCOPY WITH PROPOFOL POLYPECTOMY     Patient location during evaluation: PACU Anesthesia Type: MAC Level of consciousness: awake and alert Pain management: pain level controlled Vital Signs Assessment: post-procedure vital signs reviewed and stable Respiratory status: spontaneous breathing, nonlabored ventilation, respiratory function stable and patient connected to nasal cannula oxygen Cardiovascular status: stable and blood pressure returned to baseline Postop Assessment: no apparent nausea or vomiting Anesthetic complications: no   No notable events documented.  Last Vitals:  Vitals:   04/21/22 1205 04/21/22 1210  BP: 125/73 (!) 152/54  Pulse: 72 70  Resp: 15 16  Temp: 36.9 C   SpO2: 98% 97%    Last Pain:  Vitals:   04/21/22 1205  TempSrc:   PainSc: 0-No pain                 Leilanie Rauda S

## 2022-04-21 NOTE — Transfer of Care (Signed)
Immediate Anesthesia Transfer of Care Note  Patient: Henry Dougherty  Procedure(s) Performed: COLONOSCOPY WITH PROPOFOL POLYPECTOMY  Patient Location: PACU  Anesthesia Type:MAC  Level of Consciousness: awake, alert , oriented and patient cooperative  Airway & Oxygen Therapy: Patient Spontanous Breathing and Patient connected to face mask oxygen  Post-op Assessment: Report given to RN and Post -op Vital signs reviewed and stable  Post vital signs: Reviewed and stable  Last Vitals:  Vitals Value Taken Time  BP 125/73 04/21/22 1205  Temp 36.9 C 04/21/22 1205  Pulse 68 04/21/22 1206  Resp 18 04/21/22 1206  SpO2 99 % 04/21/22 1206  Vitals shown include unvalidated device data.  Last Pain:  Vitals:   04/21/22 1205  TempSrc:   PainSc: 0-No pain         Complications: No notable events documented.

## 2022-04-21 NOTE — Anesthesia Procedure Notes (Signed)
Procedure Name: MAC Date/Time: 04/21/2022 11:29 AM  Performed by: Niel Hummer, CRNAPre-anesthesia Checklist: Patient identified, Emergency Drugs available, Suction available and Patient being monitored Oxygen Delivery Method: Simple face mask

## 2022-04-21 NOTE — Anesthesia Preprocedure Evaluation (Signed)
Anesthesia Evaluation  Patient identified by MRN, date of birth, ID band Patient awake    Reviewed: Allergy & Precautions, NPO status , Patient's Chart, lab work & pertinent test results  Airway Mallampati: II  TM Distance: >3 FB Neck ROM: Full    Dental no notable dental hx.    Pulmonary neg pulmonary ROS,    Pulmonary exam normal breath sounds clear to auscultation       Cardiovascular hypertension, Pt. on medications and Pt. on home beta blockers +CHF  + Valvular Problems/Murmurs MR  Rhythm:Regular Rate:Normal + Systolic murmurs    Neuro/Psych negative neurological ROS  negative psych ROS   GI/Hepatic negative GI ROS, Neg liver ROS,   Endo/Other  Morbid obesity  Renal/GU negative Renal ROS  negative genitourinary   Musculoskeletal negative musculoskeletal ROS (+)   Abdominal   Peds negative pediatric ROS (+)  Hematology negative hematology ROS (+)   Anesthesia Other Findings   Reproductive/Obstetrics negative OB ROS                             Anesthesia Physical Anesthesia Plan  ASA: 3  Anesthesia Plan: MAC   Post-op Pain Management: Minimal or no pain anticipated   Induction: Intravenous  PONV Risk Score and Plan: 1 and Propofol infusion and Treatment may vary due to age or medical condition  Airway Management Planned: Simple Face Mask  Additional Equipment:   Intra-op Plan:   Post-operative Plan:   Informed Consent: I have reviewed the patients History and Physical, chart, labs and discussed the procedure including the risks, benefits and alternatives for the proposed anesthesia with the patient or authorized representative who has indicated his/her understanding and acceptance.     Dental advisory given  Plan Discussed with: CRNA and Surgeon  Anesthesia Plan Comments:         Anesthesia Quick Evaluation

## 2022-04-21 NOTE — Op Note (Signed)
[facility] Patient Name: Henry Dougherty Procedure Date: 04/21/2022 MRN: [id number] Attending MD: Gladstone Pih. Candis Schatz , MD Date of Birth: 1961/06/02 CSN: [id number] Age: 61 Admit Type: Outpatient Procedure:                Colonoscopy Indications:              Screening in patient at increased risk: Colorectal                            cancer in father 80 or older Providers:                Letcher Schweikert E. Candis Schatz, MD, Mikey College, RN, Theodora Blow, Technician Referring MD:              Medicines:                Monitored Anesthesia Care Complications:            No immediate complications. Estimated Blood Loss:     Estimated blood loss was minimal. Procedure:                Pre-Anesthesia Assessment:                           - Prior to the procedure, a History and Physical                            was performed, and patient medications and                            allergies were reviewed. The patient's tolerance of                            previous anesthesia was also reviewed. The risks                            and benefits of the procedure and the sedation                            options and risks were discussed with the patient.                            All questions were answered, and informed consent                            was obtained. Prior Anticoagulants: The patient has                            taken Pradaxa (dabigatran), last dose was 2 days                            prior to procedure. ASA Grade Assessment: III - A  patient with severe systemic disease. After                            reviewing the risks and benefits, the patient was                            deemed in satisfactory condition to undergo the                            procedure.                           After obtaining informed consent, the colonoscope                            was passed under direct vision. Throughout  the                            procedure, the patient's blood pressure, pulse, and                            oxygen saturations were monitored continuously. The                            CF-HQ190L ([id number]) Olympus colonoscope was                            introduced through the anus and advanced to the the                            terminal ileum, with identification of the                            appendiceal orifice and IC valve. The colonoscopy                            was performed without difficulty. The patient                            tolerated the procedure well. The quality of the                            bowel preparation was good. The terminal ileum,                            ileocecal valve, appendiceal orifice, and rectum                            were photographed. Scope In: 11:39:43 AM Scope Out: 11:59:50 AM Scope Withdrawal Time: 0 hours 16 minutes 17 seconds  Total Procedure Duration: 0 hours 20 minutes 7 seconds  Findings:      The perianal and digital rectal examinations were normal. Pertinent       negatives include normal sphincter tone and no palpable rectal lesions.  A 5 mm polyp was found in the cecum. The polyp was sessile. The polyp       was removed with a cold snare. Resection and retrieval were complete.       Estimated blood loss was minimal.      A 2 mm polyp was found in the ascending colon. The polyp was sessile.       The polyp was removed with a cold snare. Resection and retrieval were       complete. Estimated blood loss was minimal.      Two flat polyps were found in the transverse colon. The polyps were 5 to       7 mm in size. These polyps were removed with a cold snare. Resection and       retrieval were complete. Estimated blood loss was minimal.      A 8 mm polyp was found in the splenic flexure. The polyp was flat. The       polyp was removed with a cold snare. Resection and retrieval were       complete. Estimated blood loss  was minimal.      Multiple small and large-mouthed diverticula were found in the sigmoid       colon and descending colon.      The exam was otherwise normal throughout the examined colon.      The terminal ileum appeared normal.      The retroflexed view of the distal rectum and anal verge was normal and       showed no anal or rectal abnormalities. Impression:               - One 5 mm polyp in the cecum, removed with a cold                            snare. Resected and retrieved.                           - One 2 mm polyp in the ascending colon, removed                            with a cold snare. Resected and retrieved.                           - Two 5 to 7 mm polyps in the transverse colon,                            removed with a cold snare. Resected and retrieved.                           - One 8 mm polyp at the splenic flexure, removed                            with a cold snare. Resected and retrieved.                           - Diverticulosis in the sigmoid colon and in the  descending colon.                           - The examined portion of the ileum was normal.                           - The distal rectum and anal verge are normal on                            retroflexion view. Moderate Sedation:      Not Applicable - Patient had care per Anesthesia. Recommendation:           - Patient has a contact number available for                            emergencies. The signs and symptoms of potential                            delayed complications were discussed with the                            patient. Return to normal activities tomorrow.                            Written discharge instructions were provided to the                            patient.                           - Resume previous diet.                           - Continue present medications.                           - Await pathology results.                           -  Repeat colonoscopy (date not yet determined) for                            surveillance based on pathology results.                           - Resume Pradaxa (dabigatran) at prior dose in 2                            days (Saturday). Procedure Code(s):        --- Professional ---                           [phone], Colonoscopy, flexible; with removal of                            tumor(s), polyp(s), or other lesion(s) by snare  technique Diagnosis Code(s):        --- Professional ---                           K63.5, Polyp of colon                           Z80.0, Family history of malignant neoplasm of                            digestive organs                           K57.30, Diverticulosis of large intestine without                            perforation or abscess without bleeding CPT copyright 2019 American Medical Association. All rights reserved. The codes documented in this report are preliminary and upon coder review may  be revised to meet current compliance requirements. Aladdin Kollmann E. Candis Schatz, MD 04/21/2022 12:08:11 PM This report has been signed electronically. Number of Addenda: 0

## 2022-04-21 NOTE — Discharge Instructions (Addendum)
RESUME PRADAXA ON SATURDAY, JUNE 10th   YOU HAD AN ENDOSCOPIC PROCEDURE TODAY: Refer to the procedure report and other information in the discharge instructions given to you for any specific questions about what was found during the examination. If this information does not answer your questions, please call [place] office at [phone] to clarify.   YOU SHOULD EXPECT: Some feelings of bloating in the abdomen. Passage of more gas than usual. Walking can help get rid of the air that was put into your GI tract during the procedure and reduce the bloating. If you had a lower endoscopy (such as a colonoscopy or flexible sigmoidoscopy) you may notice spotting of blood in your stool or on the toilet paper. Some abdominal soreness may be present for a day or two, also.  DIET: Your first meal following the procedure should be a light meal and then it is ok to progress to your normal diet. A half-sandwich or bowl of soup is an example of a good first meal. Heavy or fried foods are harder to digest and may make you feel nauseous or bloated. Drink plenty of fluids but you should avoid alcoholic beverages for 24 hours. If you had a esophageal dilation, please see attached instructions for diet.    ACTIVITY: Your care partner should take you home directly after the procedure. You should plan to take it easy, moving slowly for the rest of the day. You can resume normal activity the day after the procedure however YOU SHOULD NOT DRIVE, use power tools, machinery or perform tasks that involve climbing or major physical exertion for 24 hours (because of the sedation medicines used during the test).   SYMPTOMS TO REPORT IMMEDIATELY: A gastroenterologist can be reached at any hour. Please call [phone]  for any of the following symptoms:  Following lower endoscopy (colonoscopy, flexible sigmoidoscopy) Excessive amounts of blood in the stool  Significant tenderness, worsening of abdominal pains  Swelling of the  abdomen that is new, acute  Fever of 100 or higher  FOLLOW UP:  If any biopsies were taken you will be contacted by phone or by letter within the next 1-3 weeks. Call [phone]  if you have not heard about the biopsies in 3 weeks.  Please also call with any specific questions about appointments or follow up tests.

## 2022-04-22 LAB — SURGICAL PATHOLOGY

## 2022-04-23 ENCOUNTER — Other Ambulatory Visit: Payer: Self-pay | Admitting: Cardiology

## 2022-04-27 NOTE — Progress Notes (Signed)
Henry Dougherty,   The five polyps that I removed during your recent procedure were completely benign but were proven to be "pre-cancerous" polyps that MAY have grown into cancers if they had not been removed.  Studies shows that at least 20% of women over age 63 and 30% of men over age 31 have pre-cancerous polyps.  Based on current nationally recognized surveillance guidelines, I recommend that you have a repeat colonoscopy in 3 years.   If you develop any new rectal bleeding, abdominal pain or significant bowel habit changes, please contact me before then.

## 2022-05-09 ENCOUNTER — Ambulatory Visit (INDEPENDENT_AMBULATORY_CARE_PROVIDER_SITE_OTHER): Payer: No Typology Code available for payment source | Admitting: Cardiology

## 2022-05-09 ENCOUNTER — Encounter (HOSPITAL_BASED_OUTPATIENT_CLINIC_OR_DEPARTMENT_OTHER): Payer: Self-pay | Admitting: Cardiology

## 2022-05-09 DIAGNOSIS — Z7901 Long term (current) use of anticoagulants: Secondary | ICD-10-CM

## 2022-05-09 DIAGNOSIS — Z8679 Personal history of other diseases of the circulatory system: Secondary | ICD-10-CM | POA: Diagnosis not present

## 2022-05-09 DIAGNOSIS — Z9889 Other specified postprocedural states: Secondary | ICD-10-CM

## 2022-05-09 DIAGNOSIS — I4892 Unspecified atrial flutter: Secondary | ICD-10-CM | POA: Diagnosis not present

## 2022-05-09 MED ORDER — APIXABAN 5 MG PO TABS: 5.0000 mg | ORAL_TABLET | Freq: Two times a day (BID) | ORAL | 6 refills | Status: DC

## 2022-05-11 ENCOUNTER — Other Ambulatory Visit (HOSPITAL_BASED_OUTPATIENT_CLINIC_OR_DEPARTMENT_OTHER): Payer: Self-pay | Admitting: Cardiology

## 2022-05-11 NOTE — Telephone Encounter (Signed)
Refill sent to requested pharmacy on 05/09/22.

## 2022-06-16 ENCOUNTER — Ambulatory Visit: Payer: No Typology Code available for payment source | Admitting: Cardiology

## 2022-06-20 ENCOUNTER — Other Ambulatory Visit: Payer: Self-pay | Admitting: Cardiology

## 2022-07-20 ENCOUNTER — Other Ambulatory Visit: Payer: Self-pay | Admitting: Cardiology

## 2022-07-21 NOTE — Telephone Encounter (Signed)
Yes, please refill.  Pt has a history of valve repair.

## 2022-07-21 NOTE — Telephone Encounter (Signed)
Pt's pharmacy is requesting a refill on amoxicillin for dental appt. This medication is not on pt's medication list. Would Dr. Marlou Porch like to refill this medication? Please address

## 2022-07-21 NOTE — Telephone Encounter (Signed)
Pt's medications were sent to pt's pharmacy as requested. Confirmation received.  

## 2022-10-04 LAB — BASIC METABOLIC PANEL
BUN: 20 (ref 4–21)
Creatinine: 1.3 (ref 0.6–1.3)
Glucose: 169

## 2022-10-04 LAB — HEMOGLOBIN A1C: Hemoglobin A1C: 6.2

## 2022-10-04 LAB — LIPID PANEL
LDL Cholesterol: 128
Triglycerides: 175 — AB (ref 40–160)

## 2022-10-04 LAB — COMPREHENSIVE METABOLIC PANEL: eGFR: 55.8

## 2022-10-14 ENCOUNTER — Other Ambulatory Visit: Payer: Self-pay | Admitting: Cardiology

## 2022-11-15 ENCOUNTER — Encounter (HOSPITAL_BASED_OUTPATIENT_CLINIC_OR_DEPARTMENT_OTHER): Payer: Self-pay | Admitting: Cardiology

## 2022-11-15 NOTE — Telephone Encounter (Signed)
Routing to correct triage pool 

## 2022-11-15 NOTE — Telephone Encounter (Signed)
Spoke with patient to follow up on My Chart message. Patient reports his BP has improved. Reviewed his symptoms from Christmas Day with elevated BP. Patient thinks he may have been in afib. Advised the cuff may not have read his pressure correctly with afib. He is taking Eliquis as prescribed. Advised to continue monitoring his pressure. He is scheduled with Dr Marlou Porch on 11/29/22.   Advised if he develops worsening symptoms, palpitations,chest pain and/or SOB to go to ED for evaluation. Patient verbalized understanding.

## 2022-11-29 ENCOUNTER — Encounter: Payer: Self-pay | Admitting: Cardiology

## 2022-11-29 ENCOUNTER — Ambulatory Visit: Payer: No Typology Code available for payment source | Attending: Cardiology | Admitting: Cardiology

## 2022-11-29 VITALS — BP 170/98 | HR 76 | Ht 70.0 in | Wt 341.0 lb

## 2022-11-29 DIAGNOSIS — Z7901 Long term (current) use of anticoagulants: Secondary | ICD-10-CM

## 2022-11-29 DIAGNOSIS — Z9889 Other specified postprocedural states: Secondary | ICD-10-CM

## 2022-11-29 DIAGNOSIS — Z8679 Personal history of other diseases of the circulatory system: Secondary | ICD-10-CM

## 2022-11-29 DIAGNOSIS — I10 Essential (primary) hypertension: Secondary | ICD-10-CM

## 2022-11-29 DIAGNOSIS — R0602 Shortness of breath: Secondary | ICD-10-CM | POA: Diagnosis not present

## 2022-11-29 MED ORDER — HYDROCHLOROTHIAZIDE 25 MG PO TABS: 25.0000 mg | ORAL_TABLET | Freq: Every day | ORAL | 3 refills | Status: DC

## 2022-11-29 NOTE — Patient Instructions (Signed)
Medication Instructions:  Please start Hydrochlorothiazide 25 mg once a day. Continue all other medications as listed.  *If you need a refill on your cardiac medications before your next appointment, please call your pharmacy*  Testing/Procedures: Your physician has requested that you have an echocardiogram. Echocardiography is a painless test that uses sound waves to create images of your heart. It provides your doctor with information about the size and shape of your heart and how well your heart's chambers and valves are working. This procedure takes approximately one hour. There are no restrictions for this procedure. Please do NOT wear cologne, perfume, aftershave, or lotions (deodorant is allowed). Please arrive 15 minutes prior to your appointment time.   Follow-Up: At [facility], you and your health needs are our priority.  As part of our continuing mission to provide you with exceptional heart care, we have created designated Provider Care Teams.  These Care Teams include your primary Cardiologist (physician) and Advanced Practice Providers (APPs -  Physician Assistants and Nurse Practitioners) who all work together to provide you with the care you need, when you need it.  We recommend signing up for the patient portal called "MyChart".  Sign up information is provided on this After Visit Summary.  MyChart is used to connect with patients for Virtual Visits (Telemedicine).  Patients are able to view lab/test results, encounter notes, upcoming appointments, etc.  Non-urgent messages can be sent to your provider as well.   To learn more about what you can do with MyChart, go to [email or web address].    Your next appointment:   4-6  week(s)  Provider:   [facility]

## 2022-11-29 NOTE — Progress Notes (Signed)
Cardiology Office Note:    Date:  11/29/2022   ID:  Henry Dougherty, DOB 07/14/1961, MRN [id number]  PCP:  Shon Baton, MD  [facility] Cardiologist:  Candee Furbish, MD  [facility] Electrophysiologist:  Vickie Epley, MD   Referring MD: Shon Baton, MD    History of Present Illness:    Henry Dougherty is a 62 y.o. male here for the follow-up of atrial flutter and congestive heart failure. He has had prior mitral valve repair, PFO closure, and Maze procedure. On Pradaxa for atrial fibrillation.  He followed up with Dr. Quentin Ore 02/18/2021. After discussion of AAD vs. Ablation he opted to try lifestyle modifications and increased dose of metoprolol (to 100 mg) to prevent further arrhythmic episodes. On 04/18/2021 he called complaining of diaphoresis every morning with some lightheadedness and fatigue. His metoprolol was decreased to 75 mg BID.  Prior visit with Cecilie Kicks on 04/09/2020 reviewed.  At his last appointment he was feeling well. However, he was still fatigued easily. His DOE was improved but he was not yet active enough. Drive in [place].   He denies any recurrent arrhythmic episodes. A few times he thought he might be in Afib, but he was able to rule this out by checking his pulse which is typically quick and erratic while in Afib.  With any kind of significant exertion such as walking uphill or climbing stairs he will become short of breath. He continues to work on weight loss and dietary changes. He has been trying to cut out breads and desserts.  He continues to have episodes of diaphoresis in the mornings lasting for up to 1.5 hours. He previously thought this was due to his metoprolol, prompting the decrease to 75 mg. However he has not noticed much of a difference in his symptoms since then.  Also he endorses severe heartburn, exacerbated by most everything that he eats. He is taking Pepcid daily.  He sent a message in showing that on Christmas Day blood pressure was  200/104 and then 412-878 systolic after that.  He was wondering if the metoprolol or valsartan may not have been as effective as in the past. Here with wife Butch Penny. Glucose has been an issue. A1c 6.2. Lost 13 pounds. No added sugars. No wine. When getting up feels weak or shaky. Leg gave out once.  140-150 mostly. Sweat after shower. Driving to [place] quite a bit.   He denies any palpitations, chest pain, or peripheral edema. No lightheadedness, headaches, syncope, orthopnea, or PND.   Past Medical History:  Diagnosis Date   Allergic rhinitis    Atrial fibrillation with RVR ([place]) 03/2020   CHF (congestive heart failure) (HCC)    EF 45%/ VALVULAR CM   DOE (dyspnea on exertion)    Dyspepsia    Environmental allergies    Hypertension    OA (osteoarthritis)    Obese    Preglaucoma    SOB (shortness of breath)    Uveitis    AS A TEENAGER    Past Surgical History:  Procedure Laterality Date   CARDIAC SURGERY     CARDIOVERSION N/A 04/01/2020   Procedure: CARDIOVERSION;  Surgeon: Geralynn Rile, MD;  Location: [facility];  Service: Cardiovascular;  Laterality: N/A;   COLONOSCOPY     PT WAS [place]- 2-3 TINY POLYPS   COLONOSCOPY WITH PROPOFOL N/A 04/21/2022   Procedure: COLONOSCOPY WITH PROPOFOL;  Surgeon: Daryel November, MD;  Location: [facility];  Service: Gastroenterology;  Laterality: N/A;   KNEE ARTHROSCOPY Right 2005   MITRAL VALVE REPAIR     POLYPECTOMY  04/21/2022   Procedure: POLYPECTOMY;  Surgeon: Daryel November, MD;  Location: [facility];  Service: Gastroenterology;;   SHOULDER ARTHROSCOPY Right 2005   TEE WITHOUT CARDIOVERSION N/A 04/01/2016   Procedure: TRANSESOPHAGEAL ECHOCARDIOGRAM (TEE);  Surgeon: Jerline Pain, MD;  Location: [facility];  Service: Cardiovascular;  Laterality: N/A;   TEE WITHOUT CARDIOVERSION N/A 04/01/2020   Procedure: TRANSESOPHAGEAL ECHOCARDIOGRAM (TEE);  Surgeon: Geralynn Rile, MD;  Location: [facility];  Service:  Cardiovascular;  Laterality: N/A;    Current Medications: Current Meds  Medication Sig   acetaminophen (TYLENOL) 500 MG tablet Take 1,000 mg by mouth every 6 (six) hours as needed for moderate pain or mild pain.   amoxicillin (AMOXIL) 500 MG capsule TAKE FOUR CAPSULES BY MOUTH ONE HOUR BEFORE DENTAL APPOINTMENT   apixaban (ELIQUIS) 5 MG TABS tablet Take 1 tablet (5 mg total) by mouth 2 (two) times daily.   colchicine 0.6 MG tablet Take 0.6 mg by mouth daily as needed (gout).   hydrochlorothiazide (HYDRODIURIL) 25 MG tablet Take 1 tablet (25 mg total) by mouth daily.   metoprolol succinate (TOPROL-XL) 50 MG 24 hr tablet TAKE 1 & 1/2 (ONE & ONE-HALF) TABLETS BY MOUTH ONCE DAILY IMMEDIATELY FOLLOWING A MEAL   valsartan (DIOVAN) 320 MG tablet Take 1 tablet by mouth once daily     Allergies:   Amiodarone and Cortisone   Social History   Socioeconomic History   Marital status: Married    Spouse name: Not on file   Number of children: 3   Years of education: Not on file   Highest education level: Not on file  Occupational History   Occupation: VP FOR [employer]  Tobacco Use   Smoking status: Never   Smokeless tobacco: Never  Vaping Use   Vaping Use: Never used  Substance and Sexual Activity   Alcohol use: Yes   Drug use: No   Sexual activity: Not on file  Other Topics Concern   Not on file  Social History Narrative   Not on file   Social Determinants of Health   Financial Resource Strain: Not on file  Food Insecurity: Not on file  Transportation Needs: Not on file  Physical Activity: Not on file  Stress: Not on file  Social Connections: Not on file     Family History: The patient's family history includes Atrial fibrillation in his mother; Colon cancer in his father; Heart Problems in his father and mother; Heart attack in his maternal grandfather and paternal grandfather; Stroke in his paternal grandfather. There is no history of Pancreatic cancer, Esophageal cancer,  Liver cancer, or Stomach cancer.  ROS:   Please see the history of present illness.    (+) Diaphoresis (+) Acid reflux (+) Shortness of breath All other systems reviewed and are negative.  EKGs/Labs/Other Studies Reviewed:    The following studies were reviewed today:  TEE from 04/01/2020 reviewed  Sonographer Comments: Image acquisition challenging due to respiratory  motion.    1. EF mildly reduced 45-50%; recommend repeat TTE after cardioversion.  S/p MV repair and LAA clipping. The LAA is clipped without thrombus in the  remaining segment. Successful DCCV with return to NSR. The patient  developed profound hypoxia with snoring  while sedated. Would recommend testing for sleep apnea.   2. Left ventricular ejection fraction, by estimation, is 45 to 50%. The  left ventricle has  mildly decreased function. The left ventricle  demonstrates global hypokinesis.   3. Right ventricular systolic function was not well visualized. The right  ventricular size is not well visualized.   4. A left atrial appendage clip is present. There is no apparent opening  or thrombus. No left atrial/left atrial appendage thrombus was detected.   5. The mitral valve has been repaired/replaced. Mild mitral valve  regurgitation. No evidence of mitral stenosis. The mean mitral valve  gradient is 3.7 mmHg with average heart rate of 124 bpm. There is a  prosthetic annuloplasty ring present in the mitral   position.   6. The aortic valve is tricuspid. Aortic valve regurgitation is trivial.  No aortic stenosis is present.   7. There is mild (Grade II) layered plaque involving the descending  aorta.   8. There are concerns for sleep apnea. Would recommend Sleep study.    EKG:  EKG is personally reviewed. 05/09/2022:  Sinus rhythm. Rate 78 bpm.   Recent Labs: No results found for requested labs within last 365 days.   Recent Lipid Panel No results found for: "CHOL", "TRIG", "HDL", "CHOLHDL", "VLDL",  "LDLCALC", "LDLDIRECT"  Physical Exam:    VS:  BP (!) 170/98   Pulse 76   Ht '5\' 10"'$  (1.778 m)   Wt (!) 341 lb (154.7 kg)   SpO2 99%   BMI 48.93 kg/m     Wt Readings from Last 3 Encounters:  11/29/22 (!) 341 lb (154.7 kg)  05/09/22 (!) 354 lb (160.6 kg)  04/21/22 (!) 349 lb (158.3 kg)     GEN:  Well nourished, well developed in no acute distress, obese HEENT: Normal NECK: No JVD; No carotid bruits LYMPHATICS: No lymphadenopathy CARDIAC: RRR, no murmurs, rubs, gallops RESPIRATORY:  Clear to auscultation without rales, wheezing or rhonchi  ABDOMEN: Soft, non-tender, non-distended MUSCULOSKELETAL:  No edema; No deformity  SKIN: Warm and dry NEUROLOGIC:  Alert and oriented x 3 PSYCHIATRIC:  Normal affect   ASSESSMENT:    1. Primary hypertension   2. SOB (shortness of breath)   3. S/P MVR (mitral valve repair)   4. S/P Maze operation for atrial fibrillation   5. Chronic anticoagulation      PLAN:    In order of problems listed above:  Primary hypertension Elevated over the Christmas holiday.  Has been on both metoprolol as well as valsartan.  We will go ahead and add HCTZ 25 mg once a day to his blood pressure regiment.  Creatinine has fluctuated from 1.3 at last check up to 1.6 previously.  Sodium was 137 potassium on 4.5.  Weight loss will be extremely helpful.  Low sodium.  This is challenging since he is on the road quite a bit to [place].  Atrial flutter ([place]) Doing well without any recent episodes.  He is taking Toprol 75 mg once a day.  Did not tolerate 100 mg.  S/P MVR (mitral valve repair) 2017.  We will go ahead and check another echocardiogram given some of his shortness of breath.  S/P Maze operation for atrial fibrillation 2017.  Stable.  Last echocardiogram/TEE from 2021 showed intact mitral valve repair and left atrial appendage clipping.  Chronic anticoagulation He has been experiencing GERD for quite some time.  Might be the Pradaxa.  We will go  ahead and change him to Eliquis 5 mg twice a day.  In the past he remembers that Eliquis was exorbitantly expensive.  Hopefully this will not be the case.  If  it is, we can try Xarelto.  Morbid obesity Agree with utilization of medication such as Wegovy.  He is seeing endocrinology soon.  His hemoglobin A1c was 6.2.  With his shortness of breath could also consider SGLT2 inhibitor such as Jardiance.   Follow-up:  1 month with hypertension clinic.  Medication Adjustments/Labs and Tests Ordered: Current medicines are reviewed at length with the patient today.  Concerns regarding medicines are outlined above.   Orders Placed This Encounter  Procedures   AMB Referral to [facility] Pharm-D   ECHOCARDIOGRAM COMPLETE   Meds ordered this encounter  Medications   hydrochlorothiazide (HYDRODIURIL) 25 MG tablet    Sig: Take 1 tablet (25 mg total) by mouth daily.    Dispense:  90 tablet    Refill:  3   Patient Instructions  Medication Instructions:  Please start Hydrochlorothiazide 25 mg once a day. Continue all other medications as listed.  *If you need a refill on your cardiac medications before your next appointment, please call your pharmacy*  Testing/Procedures: Your physician has requested that you have an echocardiogram. Echocardiography is a painless test that uses sound waves to create images of your heart. It provides your doctor with information about the size and shape of your heart and how well your heart's chambers and valves are working. This procedure takes approximately one hour. There are no restrictions for this procedure. Please do NOT wear cologne, perfume, aftershave, or lotions (deodorant is allowed). Please arrive 15 minutes prior to your appointment time.   Follow-Up: At [facility], you and your health needs are our priority.  As part of our continuing mission to provide you with exceptional heart care, we have created designated Provider Care Teams.  These  Care Teams include your primary Cardiologist (physician) and Advanced Practice Providers (APPs -  Physician Assistants and Nurse Practitioners) who all work together to provide you with the care you need, when you need it.  We recommend signing up for the patient portal called "MyChart".  Sign up information is provided on this After Visit Summary.  MyChart is used to connect with patients for Virtual Visits (Telemedicine).  Patients are able to view lab/test results, encounter notes, upcoming appointments, etc.  Non-urgent messages can be sent to your provider as well.   To learn more about what you can do with MyChart, go to [email or web address].    Your next appointment:   4-6  week(s)  Provider:   [facility]         Signed, Candee Furbish, MD  11/29/2022 10:34 AM    [place]

## 2022-11-30 ENCOUNTER — Other Ambulatory Visit (HOSPITAL_BASED_OUTPATIENT_CLINIC_OR_DEPARTMENT_OTHER): Payer: Self-pay | Admitting: Cardiology

## 2022-11-30 DIAGNOSIS — I4892 Unspecified atrial flutter: Secondary | ICD-10-CM

## 2022-11-30 NOTE — Telephone Encounter (Signed)
Labs received from PCP. Scr 1.3 on 10/03/22. Refill sent.

## 2022-11-30 NOTE — Telephone Encounter (Signed)
Prescription refill request for Eliquis received. Indication: Aflutter Last office visit:11/30/22 ([place])  Scr: 1.59 (01/22/21)  Age: 62 Weight: 154.7kg  Labs overdue. Will call PCP to determine if labs have been drawn within the past year.

## 2022-11-30 NOTE — Telephone Encounter (Signed)
Called PCP, left message for medical records to send labs to fax [phone].

## 2022-12-15 NOTE — Patient Instructions (Signed)

## 2022-12-19 ENCOUNTER — Encounter: Payer: Self-pay | Admitting: Nurse Practitioner

## 2022-12-19 ENCOUNTER — Ambulatory Visit: Payer: BLUE CROSS/BLUE SHIELD | Admitting: Nurse Practitioner

## 2022-12-19 VITALS — BP 150/70 | HR 79 | Ht 70.0 in | Wt 335.2 lb

## 2022-12-19 DIAGNOSIS — R7303 Prediabetes: Secondary | ICD-10-CM | POA: Diagnosis not present

## 2022-12-19 MED ORDER — ACCU-CHEK GUIDE VI STRP
ORAL_STRIP | 12 refills | Status: DC
Start: 2022-12-19 — End: 2024-03-12

## 2022-12-19 MED ORDER — ACCU-CHEK GUIDE ME W/DEVICE KIT: PACK | 0 refills | Status: DC

## 2022-12-19 MED ORDER — ACCU-CHEK SOFTCLIX LANCETS MISC: 12 refills | Status: DC

## 2022-12-19 NOTE — Progress Notes (Signed)
Endocrinology Consult Note       12/19/2022, 10:36 AM   Subjective:    Patient ID: Henry Dougherty, male    DOB: 1961-06-15.  Henry Dougherty is being seen in consultation for management of prediabetes requested by  Shon Baton, MD.   Past Medical History:  Diagnosis Date   Allergic rhinitis    Atrial fibrillation with RVR ([place]) 03/2020   CHF (congestive heart failure) ([place])    EF 45%/ VALVULAR CM   DOE (dyspnea on exertion)    Dyspepsia    Environmental allergies    Hypertension    OA (osteoarthritis)    Obese    Preglaucoma    SOB (shortness of breath)    Uveitis    AS A TEENAGER    Past Surgical History:  Procedure Laterality Date   CARDIAC SURGERY     CARDIOVERSION N/A 04/01/2020   Procedure: CARDIOVERSION;  Surgeon: Geralynn Rile, MD;  Location: [facility];  Service: Cardiovascular;  Laterality: N/A;   COLONOSCOPY     PT WAS [place]- 2-3 TINY POLYPS   COLONOSCOPY WITH PROPOFOL N/A 04/21/2022   Procedure: COLONOSCOPY WITH PROPOFOL;  Surgeon: Daryel November, MD;  Location: [facility];  Service: Gastroenterology;  Laterality: N/A;   KNEE ARTHROSCOPY Right 2005   MITRAL VALVE REPAIR     POLYPECTOMY  04/21/2022   Procedure: POLYPECTOMY;  Surgeon: Daryel November, MD;  Location: [facility];  Service: Gastroenterology;;   SHOULDER ARTHROSCOPY Right 2005   TEE WITHOUT CARDIOVERSION N/A 04/01/2016   Procedure: TRANSESOPHAGEAL ECHOCARDIOGRAM (TEE);  Surgeon: Jerline Pain, MD;  Location: [facility];  Service: Cardiovascular;  Laterality: N/A;   TEE WITHOUT CARDIOVERSION N/A 04/01/2020   Procedure: TRANSESOPHAGEAL ECHOCARDIOGRAM (TEE);  Surgeon: Geralynn Rile, MD;  Location: [facility];  Service: Cardiovascular;  Laterality: N/A;    Social History   Socioeconomic History   Marital status: Married    Spouse name: Not on file   Number of children: 3   Years of education:  Not on file   Highest education level: Not on file  Occupational History   Occupation: VP FOR [employer]  Tobacco Use   Smoking status: Never   Smokeless tobacco: Never  Vaping Use   Vaping Use: Never used  Substance and Sexual Activity   Alcohol use: Yes   Drug use: No   Sexual activity: Not on file  Other Topics Concern   Not on file  Social History Narrative   Not on file   Social Determinants of Health   Financial Resource Strain: Not on file  Food Insecurity: Not on file  Transportation Needs: Not on file  Physical Activity: Not on file  Stress: Not on file  Social Connections: Not on file    Family History  Problem Relation Age of Onset   Heart Problems Mother    Atrial fibrillation Mother    Colon cancer Father    Heart Problems Father    Heart attack Maternal Grandfather        died from heart attack in his 65's   Heart attack Paternal Grandfather    Stroke Paternal Grandfather    Pancreatic  cancer Neg Hx    Esophageal cancer Neg Hx    Liver cancer Neg Hx    Stomach cancer Neg Hx     Outpatient Encounter Medications as of 12/19/2022  Medication Sig   Accu-Chek Softclix Lancets lancets Use as instructed to monitor glucose twice daily   acetaminophen (TYLENOL) 500 MG tablet Take 1,000 mg by mouth every 6 (six) hours as needed for moderate pain or mild pain.   amoxicillin (AMOXIL) 500 MG capsule TAKE FOUR CAPSULES BY MOUTH ONE HOUR BEFORE DENTAL APPOINTMENT   apixaban (ELIQUIS) 5 MG TABS tablet Take 1 tablet by mouth twice daily   Blood Glucose Monitoring Suppl (ACCU-CHEK GUIDE ME) w/Device KIT Use to monitor glucose twice daily   colchicine 0.6 MG tablet Take 0.6 mg by mouth daily as needed (gout).   glucose blood (ACCU-CHEK GUIDE) test strip Use as instructed to monitor glucose twice daily   hydrochlorothiazide (HYDRODIURIL) 25 MG tablet Take 1 tablet (25 mg total) by mouth daily.   metoprolol succinate (TOPROL-XL) 50 MG 24 hr tablet TAKE 1 & 1/2 (ONE &  ONE-HALF) TABLETS BY MOUTH ONCE DAILY IMMEDIATELY FOLLOWING A MEAL   valsartan (DIOVAN) 320 MG tablet Take 1 tablet by mouth once daily   No facility-administered encounter medications on file as of 12/19/2022.    ALLERGIES: Allergies  Allergen Reactions   Amiodarone Other (See Comments)    Blisters and whelps to IV FORM.   Cortisone     Glaucoma HX ,  Triggers increased pressure in eye    VACCINATION STATUS:  There is no immunization history on file for this patient.  Diabetes He presents for his initial diabetic visit. Diabetes type: prediabetes. Onset time: prediabetes since age 27. Hypoglycemia symptoms include dizziness, nervousness/anxiousness, sweats and tremors. Associated symptoms include foot paresthesias and weight loss. Diabetic complications include heart disease (CHF- s/p mitral valve repair) and nephropathy. Risk factors for coronary artery disease include dyslipidemia, male sex, obesity, hypertension and sedentary lifestyle. Current diabetic treatment includes diet. He is compliant with treatment some of the time. His weight is decreasing steadily. He is following a generally healthy and diabetic diet. Meal planning includes avoidance of concentrated sweets. He has not had a previous visit with a dietitian. He rarely participates in exercise. His breakfast blood glucose range is generally 140-180 mg/dl. (He presents today for his consultation, accompanied by his wife, with his glucose readings showing fasting glucose ranging between 140-160.  His most recent A1c was 6.2% on 11/20.  He notes he does have a very stressful job and travels often which makes healthy eating somewhat difficult.  He notes he has symptoms of hypoglycemia following meals prompting him to snack between meals.  He noticed this happening since Christmas and thus has started eating healthier since then.  He drinks coffee with splenda and water and eats 3 meals per day with snacks between.  He does not engage in  routine physical activity.  He is due for eye exam (has one coming up).) An ACE inhibitor/angiotensin II receptor blocker is being taken. He does not see a podiatrist.Eye exam is not current.     Review of systems  Constitutional: + steadily decreasing body weight-intentional due to diet changes, current Body mass index is 48.1 kg/m., no fatigue, no subjective hyperthermia, no subjective hypothermia Eyes: no blurry vision, no xerophthalmia ENT: no sore throat, no nodules palpated in throat, no dysphagia/odynophagia, no hoarseness Cardiovascular: no chest pain, no shortness of breath, no palpitations, no leg swelling Respiratory:  no cough, no shortness of breath Gastrointestinal: no nausea/vomiting/diarrhea Musculoskeletal: no muscle/joint aches Skin: no rashes, no hyperemia Neurological: no tremors, + numbness/tingling/nerve pain to bilateral feet, no dizziness Psychiatric: no depression, no anxiety  Objective:     BP (!) 150/70 (BP Location: Right Arm, Patient Position: Sitting, Cuff Size: Large) Comment: Retake -  Mauel cuff - paiten twill be goimg to a hypertension clinic  soon.  Pulse 79   Ht '5\' 10"'$  (1.778 m)   Wt (!) 335 lb 3.2 oz (152 kg)   BMI 48.10 kg/m   Wt Readings from Last 3 Encounters:  12/19/22 (!) 335 lb 3.2 oz (152 kg)  11/29/22 (!) 341 lb (154.7 kg)  05/09/22 (!) 354 lb (160.6 kg)     BP Readings from Last 3 Encounters:  12/19/22 (!) 150/70  11/29/22 (!) 170/98  05/09/22 120/80     Physical Exam- Limited  Constitutional:  Body mass index is 48.1 kg/m. , not in acute distress, normal state of mind Eyes:  EOMI, no exophthalmos Neck: Supple Cardiovascular: RRR, no murmurs, rubs, or gallops, no edema Respiratory: Adequate breathing efforts, no crackles, rales, rhonchi, or wheezing Musculoskeletal: no gross deformities, strength intact in all four extremities, no gross restriction of joint movements Skin:  no rashes, no hyperemia Neurological: no tremor  with outstretched hands    CMP ( most recent) CMP     Component Value Date/Time   NA 137 01/22/2021 0931   NA 139 05/07/2020 1156   K 4.5 01/22/2021 0931   CL 102 01/22/2021 0931   CO2 23 01/22/2021 0931   GLUCOSE 184 (H) 01/22/2021 0931   BUN 20 10/04/2022 0000   CREATININE 1.3 10/04/2022 0000   CREATININE 1.59 (H) 01/22/2021 0931   CREATININE 2.19 (H) 04/13/2016 1048   CALCIUM 9.1 01/22/2021 0931   PROT 6.3 (L) 01/22/2021 0931   ALBUMIN 3.7 01/22/2021 0931   AST 21 01/22/2021 0931   ALT 37 01/22/2021 0931   ALKPHOS 41 01/22/2021 0931   BILITOT 1.0 01/22/2021 0931   GFRNONAA 49 (L) 01/22/2021 0931   GFRAA 71 05/07/2020 1156     Diabetic Labs (most recent): Lab Results  Component Value Date   HGBA1C 6.2 10/04/2022   HGBA1C 6.1 (H) 03/30/2020     Lipid Panel ( most recent) Lipid Panel     Component Value Date/Time   TRIG 175 (A) 10/04/2022 0000   LDLCALC 128 10/04/2022 0000      Lab Results  Component Value Date   TSH 1.459 03/30/2020   TSH 2.459 03/31/2016           Assessment & Plan:   1) Prediabetes  He presents today for his consultation, accompanied by his wife, with his glucose readings showing fasting glucose ranging between 140-160.  His most recent A1c was 6.2% on 11/20.  He notes he does have a very stressful job and travels often which makes healthy eating somewhat difficult.  He notes he has symptoms of hypoglycemia following meals prompting him to snack between meals.  He noticed this happening since Christmas and thus has started eating healthier since then.  He drinks coffee with splenda and water and eats 3 meals per day with snacks between.  He does not engage in routine physical activity.  He is due for eye exam (has one coming up).  - ARGUS CARAHER has prediabetes since 62 years of age, with most recent A1c of 6.2 %.   -Recent labs reviewed.  - I had  a long discussion with him about the progressive nature of diabetes and the  pathology behind its complications. -his prediabetes is complicated by CHF, CKD stage 3a and he remains at a high risk for more acute and chronic complications which include CAD, CVA, CKD, retinopathy, and neuropathy. These are all discussed in detail with him.  The following Lifestyle Medicine recommendations according to [facility]) were discussed and offered to patient and he agrees to start the journey:  A. Whole Foods, Plant-based plate comprising of fruits and vegetables, plant-based proteins, whole-grain carbohydrates was discussed in detail with the patient.   A list for source of those nutrients were also provided to the patient.  Patient will use only water or unsweetened tea for hydration. B.  The need to stay away from risky substances including alcohol, smoking; obtaining 7 to 9 hours of restorative sleep, at least 150 minutes of moderate intensity exercise weekly, the importance of healthy social connections,  and stress reduction techniques were discussed. C.  A full color page of  Calorie density of various food groups per pound showing examples of each food groups was provided to the patient.  - I have counseled him on diet and weight management by adopting a carbohydrate restricted/protein rich diet. Patient is encouraged to switch to unprocessed or minimally processed complex starch and increased protein intake (animal or plant source), fruits, and vegetables. -  he is advised to stick to a routine mealtimes to eat 3 meals a day and avoid unnecessary snacks (to snack only to correct hypoglycemia).   - he acknowledges that there is a room for improvement in his food and drink choices. - Suggestion is made for him to avoid simple carbohydrates from his diet including Cakes, Sweet Desserts, Ice Cream, Soda (diet and regular), Sweet Tea, Candies, Chips, Cookies, Store Bought Juices, Alcohol in Excess of 1-2 drinks a day, Artificial Sweeteners, Coffee  Creamer, and "Sugar-free" Products. This will help patient to have more stable blood glucose profile and potentially avoid unintended weight gain.  - he will be scheduled with Jearld Fenton, RDN, CDE for diabetes education.  - I have approached him with the following individualized plan to manage his diabetes and patient agrees:   -he is encouraged to start monitoring glucose 2 times daily, before breakfast and before bed, to log their readings on the clinic sheets provided, and bring them to review at follow up appointment in 4 weeks.  - Adjustment parameters are given to him for hypo and hyperglycemia in writing. - he is encouraged to call clinic for blood glucose levels less than 70 or above 300 mg /dl.  - he is not a candidate for Metformin due to concurrent renal insufficiency.  - he will be considered for incretin therapy as appropriate next visit.  Will try lifestyle modifications first as he has real chance of improving his health and avoiding needing medications.  - Specific targets for  A1c; LDL, HDL, and Triglycerides were discussed with the patient.  2) Blood Pressure /Hypertension:  his blood pressure is not controlled to target, says it has been running high since Christmas.   he is advised to continue his current medications including Valsartan 320 mg po daily, HCTZ 25 mg po daily, and Metoprolol 75 mg po daily.  3) Lipids/Hyperlipidemia:    Review of his recent lipid panel from 10/04/22 showed uncontrolled LDL at 128 and slightly elevated triglycerides of 175.  He is currently not on any lipid lowering medications.  4)  Weight/Diet:  his Body mass index is 48.1 kg/m.  -  clearly complicating his diabetes care.   he is a candidate for weight loss. I discussed with him the fact that loss of 5 - 10% of his  current body weight will have the most impact on his diabetes management.  Exercise, and detailed carbohydrates information provided  -  detailed on discharge  instructions.  5) Chronic Care/Health Maintenance: -he is on ACEI/ARB and is not on Statin medications and is encouraged to initiate and continue to follow up with Ophthalmology, Dentist, Podiatrist at least yearly or according to recommendations, and advised to stay away from smoking. I have recommended yearly flu vaccine and pneumonia vaccine at least every 5 years; moderate intensity exercise for up to 150 minutes weekly; and sleep for at least 7 hours a day.  - he is advised to maintain close follow up with Shon Baton, MD for primary care needs, as well as his other providers for optimal and coordinated care.   - Time spent in this patient care: 60 min, of which > 50% was spent in counseling him about his diabetes and the rest reviewing his blood glucose logs, discussing his hypoglycemia and hyperglycemia episodes, reviewing his current and previous labs/studies (including abstraction from other facilities) and medications doses and developing a long term treatment plan based on the latest standards of care/guidelines; and documenting his care.    Please refer to Patient Instructions for Blood Glucose Monitoring and Insulin/Medications Dosing Guide" in media tab for additional information. Please also refer to "Patient Self Inventory" in the Media tab for reviewed elements of pertinent patient history.  Henry Dougherty participated in the discussions, expressed understanding, and voiced agreement with the above plans.  All questions were answered to his satisfaction. he is encouraged to contact clinic should he have any questions or concerns prior to his return visit.     Follow up plan: - Return in about 1 month (around 01/17/2023) for Diabetes F/U, Bring meter and logs.    Rayetta Pigg, [facility] [address] [place], [place] [address] Phone: [phone] Fax: [phone]  12/19/2022, 10:36 AM

## 2022-12-23 ENCOUNTER — Ambulatory Visit (HOSPITAL_COMMUNITY): Payer: BLUE CROSS/BLUE SHIELD | Attending: Cardiology

## 2022-12-23 DIAGNOSIS — R0602 Shortness of breath: Secondary | ICD-10-CM | POA: Diagnosis present

## 2022-12-23 DIAGNOSIS — I10 Essential (primary) hypertension: Secondary | ICD-10-CM | POA: Diagnosis not present

## 2022-12-23 LAB — ECHOCARDIOGRAM COMPLETE
Area-P 1/2: 4.36 cm2
S' Lateral: 3 cm

## 2022-12-23 MED ORDER — PERFLUTREN LIPID MICROSPHERE
1.0000 mL | INTRAVENOUS | Status: AC | PRN
  Administered 2022-12-23: 3 mL via INTRAVENOUS

## 2023-01-09 ENCOUNTER — Ambulatory Visit: Payer: No Typology Code available for payment source

## 2023-01-15 ENCOUNTER — Encounter: Payer: Self-pay | Admitting: Cardiology

## 2023-01-16 MED ORDER — AMOXICILLIN 500 MG PO CAPS: ORAL_CAPSULE | ORAL | 2 refills | Status: DC

## 2023-01-18 ENCOUNTER — Encounter: Payer: Self-pay | Admitting: Nurse Practitioner

## 2023-01-18 ENCOUNTER — Ambulatory Visit: Payer: BLUE CROSS/BLUE SHIELD | Admitting: Nurse Practitioner

## 2023-01-18 VITALS — BP 157/90 | HR 73 | Ht 70.0 in | Wt 334.0 lb

## 2023-01-18 DIAGNOSIS — R7303 Prediabetes: Secondary | ICD-10-CM | POA: Diagnosis not present

## 2023-01-18 LAB — POCT GLYCOSYLATED HEMOGLOBIN (HGB A1C): Hemoglobin A1C: 6.7 % — AB (ref 4.0–5.6)

## 2023-01-18 NOTE — Progress Notes (Signed)
Endocrinology Follow Up Note       01/18/2023, 8:38 AM   Subjective:    Patient ID: Henry Dougherty, male    DOB: [birth year].  Henry Dougherty is being seen in follow up after being seen for management of prediabetes requested by  Shon Baton, MD.   Past Medical History:  Diagnosis Date   Allergic rhinitis    Atrial fibrillation with RVR ([place]) 03/2020   CHF (congestive heart failure) ([place])    EF 45%/ VALVULAR CM   DOE (dyspnea on exertion)    Dyspepsia    Environmental allergies    Hypertension    OA (osteoarthritis)    Obese    Preglaucoma    SOB (shortness of breath)    Uveitis    AS A TEENAGER    Past Surgical History:  Procedure Laterality Date   CARDIAC SURGERY     CARDIOVERSION N/A 04/01/2020   Procedure: CARDIOVERSION;  Surgeon: Geralynn Rile, MD;  Location: [facility];  Service: Cardiovascular;  Laterality: N/A;   COLONOSCOPY     PT WAS [facility]- 2-3 TINY POLYPS   COLONOSCOPY WITH PROPOFOL N/A 04/21/2022   Procedure: COLONOSCOPY WITH PROPOFOL;  Surgeon: Daryel November, MD;  Location: [facility];  Service: Gastroenterology;  Laterality: N/A;   KNEE ARTHROSCOPY Right 2005   MITRAL VALVE REPAIR     POLYPECTOMY  04/21/2022   Procedure: POLYPECTOMY;  Surgeon: Daryel November, MD;  Location: [facility];  Service: Gastroenterology;;   SHOULDER ARTHROSCOPY Right 2005   TEE WITHOUT CARDIOVERSION N/A 04/01/2016   Procedure: TRANSESOPHAGEAL ECHOCARDIOGRAM (TEE);  Surgeon: Jerline Pain, MD;  Location: [facility];  Service: Cardiovascular;  Laterality: N/A;   TEE WITHOUT CARDIOVERSION N/A 04/01/2020   Procedure: TRANSESOPHAGEAL ECHOCARDIOGRAM (TEE);  Surgeon: Geralynn Rile, MD;  Location: [facility] ENDOSCOPY;  Service: Cardiovascular;  Laterality: N/A;    Social History   Socioeconomic History   Marital status: Married    Spouse name: Not on file   Number of children: 3   Years  of education: Not on file   Highest education level: Not on file  Occupational History   Occupation: VP FOR [employer]  Tobacco Use   Smoking status: Never   Smokeless tobacco: Never  Vaping Use   Vaping Use: Never used  Substance and Sexual Activity   Alcohol use: Yes   Drug use: No   Sexual activity: Not on file  Other Topics Concern   Not on file  Social History Narrative   Not on file   Social Determinants of Health   Financial Resource Strain: Not on file  Food Insecurity: Not on file  Transportation Needs: Not on file  Physical Activity: Not on file  Stress: Not on file  Social Connections: Not on file    Family History  Problem Relation Age of Onset   Heart Problems Mother    Atrial fibrillation Mother    Colon cancer Father    Heart Problems Father    Heart attack Maternal Grandfather        died from heart attack in his 34's   Heart attack Paternal Grandfather    Stroke Paternal  Grandfather    Pancreatic cancer Neg Hx    Esophageal cancer Neg Hx    Liver cancer Neg Hx    Stomach cancer Neg Hx     Outpatient Encounter Medications as of 01/18/2023  Medication Sig   Accu-Chek Softclix Lancets lancets Use as instructed to monitor glucose twice daily   acetaminophen (TYLENOL) 500 MG tablet Take 1,000 mg by mouth every 6 (six) hours as needed for moderate pain or mild pain.   amoxicillin (AMOXIL) 500 MG capsule TAKE FOUR CAPSULES BY MOUTH ONE HOUR BEFORE DENTAL APPOINTMENT   apixaban (ELIQUIS) 5 MG TABS tablet Take 1 tablet by mouth twice daily   Blood Glucose Monitoring Suppl (ACCU-CHEK GUIDE ME) w/Device KIT Use to monitor glucose twice daily   colchicine 0.6 MG tablet Take 0.6 mg by mouth daily as needed (gout).   glucose blood (ACCU-CHEK GUIDE) test strip Use as instructed to monitor glucose twice daily   hydrochlorothiazide (HYDRODIURIL) 25 MG tablet Take 1 tablet (25 mg total) by mouth daily.   metoprolol succinate (TOPROL-XL) 50 MG 24 hr tablet TAKE 1  & 1/2 (ONE & ONE-HALF) TABLETS BY MOUTH ONCE DAILY IMMEDIATELY FOLLOWING A MEAL   valsartan (DIOVAN) 320 MG tablet Take 1 tablet by mouth once daily   No facility-administered encounter medications on file as of 01/18/2023.    ALLERGIES: Allergies  Allergen Reactions   Amiodarone Other (See Comments)    Blisters and whelps to IV FORM.   Cortisone     Glaucoma HX ,  Triggers increased pressure in eye    VACCINATION STATUS:  There is no immunization history on file for this patient.  Diabetes He presents for his follow-up diabetic visit. Diabetes type: prediabetes. Onset time: prediabetes since age 22. There are no hypoglycemic associated symptoms. Associated symptoms include foot paresthesias and weight loss (slow). Diabetic complications include heart disease (CHF- s/p mitral valve repair) and nephropathy. Risk factors for coronary artery disease include dyslipidemia, male sex, obesity, hypertension and sedentary lifestyle. Current diabetic treatment includes diet. He is compliant with treatment most of the time. His weight is decreasing steadily. He is following a generally healthy and diabetic diet. Meal planning includes avoidance of concentrated sweets. He has not had a previous visit with a dietitian. He rarely participates in exercise. His breakfast blood glucose range is generally 140-180 mg/dl. His bedtime blood glucose range is generally 110-130 mg/dl. (He presents today, accompanied by his wife, with no meter or logs (accidentally left them at home).  His POCT A1c today is 6.7%, increasing slightly from last visit of 6.2%.  He notes morning readings have been averaging around 140-180 and bedtime readings have been lower at 105-140.  He denies any symptoms of hypoglycemia.  He has started eating breakfast (something he seldomly ever did before).  He is still missing the physical activity piece.  ) An ACE inhibitor/angiotensin II receptor blocker is being taken. He does not see a  podiatrist.Eye exam is not current.     Review of systems  Constitutional: + steadily decreasing body weight-intentional due to diet changes, current Body mass index is 47.92 kg/m., no fatigue, no subjective hyperthermia, no subjective hypothermia Eyes: no blurry vision, no xerophthalmia ENT: no sore throat, no nodules palpated in throat, no dysphagia/odynophagia, no hoarseness Cardiovascular: no chest pain, no shortness of breath, no palpitations, no leg swelling Respiratory: no cough, no shortness of breath Gastrointestinal: no nausea/vomiting/diarrhea Musculoskeletal: no muscle/joint aches Skin: no rashes, no hyperemia Neurological: no tremors, + numbness/tingling/nerve pain  to bilateral feet- worse at night (somewhat improved with OTC Magnesium supplement), no dizziness Psychiatric: no depression, no anxiety  Objective:     BP (!) 157/90   Pulse 73   Ht '5\' 10"'$  (1.778 m)   Wt (!) 334 lb (151.5 kg)   BMI 47.92 kg/m   Wt Readings from Last 3 Encounters:  01/18/23 (!) 334 lb (151.5 kg)  12/19/22 (!) 335 lb 3.2 oz (152 kg)  11/29/22 (!) 341 lb (154.7 kg)     BP Readings from Last 3 Encounters:  01/18/23 (!) 157/90  12/19/22 (!) 150/70  11/29/22 (!) 170/98     Physical Exam- Limited  Constitutional:  Body mass index is 47.92 kg/m. , not in acute distress, normal state of mind Eyes:  EOMI, no exophthalmos Musculoskeletal: no gross deformities, strength intact in all four extremities, no gross restriction of joint movements Skin:  no rashes, no hyperemia Neurological: no tremor with outstretched hands    CMP ( most recent) CMP     Component Value Date/Time   NA 137 01/22/2021 0931   NA 139 05/07/2020 1156   K 4.5 01/22/2021 0931   CL 102 01/22/2021 0931   CO2 23 01/22/2021 0931   GLUCOSE 184 (H) 01/22/2021 0931   BUN 20 10/04/2022 0000   CREATININE 1.3 10/04/2022 0000   CREATININE 1.59 (H) 01/22/2021 0931   CREATININE 2.19 (H) 04/13/2016 1048   CALCIUM 9.1  01/22/2021 0931   PROT 6.3 (L) 01/22/2021 0931   ALBUMIN 3.7 01/22/2021 0931   AST 21 01/22/2021 0931   ALT 37 01/22/2021 0931   ALKPHOS 41 01/22/2021 0931   BILITOT 1.0 01/22/2021 0931   GFRNONAA 49 (L) 01/22/2021 0931   GFRAA 71 05/07/2020 1156     Diabetic Labs (most recent): Lab Results  Component Value Date   HGBA1C 6.7 (A) 01/18/2023   HGBA1C 6.2 10/04/2022   HGBA1C 6.1 (H) 03/30/2020     Lipid Panel ( most recent) Lipid Panel     Component Value Date/Time   TRIG 175 (A) 10/04/2022 0000   LDLCALC 128 10/04/2022 0000      Lab Results  Component Value Date   TSH 1.459 03/30/2020   TSH 2.459 03/31/2016           Assessment & Plan:   1) Prediabetes  He presents today, accompanied by his wife, with no meter or logs (accidentally left them at home).  His POCT A1c today is 6.7%, increasing slightly from last visit of 6.2%.  He notes morning readings have been averaging around 140-180 and bedtime readings have been lower at 105-140.  He denies any symptoms of hypoglycemia.  He has started eating breakfast (something he seldomly ever did before).  He is still missing the physical activity piece.    - Henry Dougherty has prediabetes since 62 years of age.   -Recent labs reviewed.  - I had a long discussion with him about the progressive nature of diabetes and the pathology behind its complications. -his prediabetes is complicated by CHF, CKD stage 3a and he remains at a high risk for more acute and chronic complications which include CAD, CVA, CKD, retinopathy, and neuropathy. These are all discussed in detail with him.  The following Lifestyle Medicine recommendations according to [facility]) were discussed and offered to patient and he agrees to start the journey:  A. Whole Foods, Plant-based plate comprising of fruits and vegetables, plant-based proteins, whole-grain carbohydrates was discussed in detail  with the patient.   A list  for source of those nutrients were also provided to the patient.  Patient will use only water or unsweetened tea for hydration. B.  The need to stay away from risky substances including alcohol, smoking; obtaining 7 to 9 hours of restorative sleep, at least 150 minutes of moderate intensity exercise weekly, the importance of healthy social connections,  and stress reduction techniques were discussed. C.  A full color page of  Calorie density of various food groups per pound showing examples of each food groups was provided to the patient.  - Nutritional counseling repeated at each appointment due to patients tendency to fall back in to old habits.  - The patient admits there is a room for improvement in their diet and drink choices. -  Suggestion is made for the patient to avoid simple carbohydrates from their diet including Cakes, Sweet Desserts / Pastries, Ice Cream, Soda (diet and regular), Sweet Tea, Candies, Chips, Cookies, Sweet Pastries, Store Bought Juices, Alcohol in Excess of 1-2 drinks a day, Artificial Sweeteners, Coffee Creamer, and "Sugar-free" Products. This will help patient to have stable blood glucose profile and potentially avoid unintended weight gain.   - I encouraged the patient to switch to unprocessed or minimally processed complex starch and increased protein intake (animal or plant source), fruits, and vegetables.   - Patient is advised to stick to a routine mealtimes to eat 3 meals a day and avoid unnecessary snacks (to snack only to correct hypoglycemia).  - I have approached him with the following individualized plan to manage his diabetes and patient agrees:   Will try lifestyle modifications first as he has real chance of improving his health and avoiding needing medications.  He has made strides in the right direction, just needs time for the metabolism to catch up. Will give him more time before considering medications.  -he is encouraged to continue monitoring  glucose at least once daily, before breakfast, and call the clinic if he has readings less than 70 or above 200 for 3 tests in row.  - Adjustment parameters are given to him for hypo and hyperglycemia in writing.  - he is not a candidate for Metformin due to concurrent renal insufficiency.  - he will be considered for incretin therapy as appropriate next visit.    - Specific targets for  A1c; LDL, HDL, and Triglycerides were discussed with the patient.  2) Blood Pressure /Hypertension:  his blood pressure is not controlled to target, says it has been running high since Christmas.   he is advised to continue his current medications including Valsartan 320 mg po daily, HCTZ 25 mg po daily, and Metoprolol 75 mg po daily.    3) Lipids/Hyperlipidemia:    Review of his recent lipid panel from 10/04/22 showed uncontrolled LDL at 128 and slightly elevated triglycerides of 175.  He is currently not on any lipid lowering medications.   4)  Weight/Diet:  his Body mass index is 47.92 kg/m.  -  clearly complicating his diabetes care.   he is a candidate for weight loss. I discussed with him the fact that loss of 5 - 10% of his  current body weight will have the most impact on his diabetes management.  Exercise, and detailed carbohydrates information provided  -  detailed on discharge instructions.  5) Chronic Care/Health Maintenance: -he is on ACEI/ARB and is not on Statin medications and is encouraged to initiate and continue to follow up with Ophthalmology,  Dentist, Podiatrist at least yearly or according to recommendations, and advised to stay away from smoking. I have recommended yearly flu vaccine and pneumonia vaccine at least every 5 years; moderate intensity exercise for up to 150 minutes weekly; and sleep for at least 7 hours a day.  - he is advised to maintain close follow up with Shon Baton, MD for primary care needs, as well as his other providers for optimal and coordinated care.  -I did  encourage him to wear proper fitting shoes with adequate support to help reduce foot discomfort.   I spent  40  minutes in the care of the patient today including review of labs from [place] [facility], Lipids, Thyroid Function, Hematology (current and previous including abstractions from other facilities); face-to-face time discussing  his blood glucose readings/logs, discussing hypoglycemia and hyperglycemia episodes and symptoms, medications doses, his options of short and long term treatment based on the latest standards of care / guidelines;  discussion about incorporating lifestyle medicine;  and documenting the encounter. Risk reduction counseling performed per USPSTF guidelines to reduce obesity and cardiovascular risk factors.     Please refer to Patient Instructions for Blood Glucose Monitoring and Insulin/Medications Dosing Guide"  in media tab for additional information. Please  also refer to " Patient Self Inventory" in the Media  tab for reviewed elements of pertinent patient history.  Henry Dougherty participated in the discussions, expressed understanding, and voiced agreement with the above plans.  All questions were answered to his satisfaction. he is encouraged to contact clinic should he have any questions or concerns prior to his return visit.     Follow up plan: - Return in about 3 months (around 04/20/2023) for Diabetes F/U with A1c in office, No previsit labs.   Henry Dougherty, [facility] [address] [place], [place] [address] Phone: [phone] Fax: [phone]  01/18/2023, 8:38 AM

## 2023-01-18 NOTE — Patient Instructions (Signed)

## 2023-02-14 ENCOUNTER — Ambulatory Visit: Payer: BLUE CROSS/BLUE SHIELD | Attending: Cardiovascular Disease | Admitting: Pharmacist

## 2023-02-14 VITALS — BP 160/84 | HR 77

## 2023-02-14 DIAGNOSIS — I10 Essential (primary) hypertension: Secondary | ICD-10-CM

## 2023-02-14 MED ORDER — AMLODIPINE BESYLATE 2.5 MG PO TABS: 2.5000 mg | ORAL_TABLET | Freq: Every day | ORAL | 3 refills | Status: DC

## 2023-02-14 NOTE — Progress Notes (Unsigned)
Patient ID: Henry Dougherty                 DOB: 02/27/1961                      MRN: [id number]      HPI: Henry Dougherty is a 62 y.o. male referred by Dr. Marlou Porch to HTN clinic. PMH is significant for mitral valve repair, afib, PFO closure, Maze procedure, CHF, prediabetes and HTN.  Patient was seen by Dr. Marlou Porch in January where he reported elevated blood pressures around Christmas time.  Blood pressure in clinic that day was 170/98.  HCTZ 25 mg daily was started.  He has been seen twice by endocrinology since visit with Dr. Marlou Porch.  Unfortunately his A1c has increased from 6.2 to 6.7.  He has implemented some dietary changes but has not implemented any physical activity.  Patient presents to clinic today.  He reports that his blood pressure at home is better.  Typically running in the 130s/70's.  Sometimes his systolic will be in the 0000000.  Reports losing 22 pounds since Christmas but has plateaued.  He knows he needs to exercise but states he has a lot of excuses.  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   He reports some dizziness and lightheadedness occasionally when he stands up.  Every time but does happen about several times a week.  Denies any headaches or blurred vision.  Shortness of breath only when he walks uphill.  No swelling.  Chart states that he has been previously on amlodipine, patient does not remember this.  Current HTN meds: valsartan 320 mg daily, hydrochlorothiazide 25 mg daily metoprolol succinate 75 mg daily Previously tried: amlodipine 10 mg (pt doesn't remember) BP goal: <130/80  Family History: The patient's family history includes Atrial fibrillation in his mother; Colon cancer in his father; Heart Problems in his father and mother; Heart attack in his maternal grandfather and paternal grandfather; Stroke in his paternal grandfather. There is no history of Pancreatic cancer, Esophageal cancer, Liver cancer, or Stomach  cancer.   Social History: 1-2 glassed of wine maybe once a week  Diet: Not discussed in great detail since he has been working with endocrinology on this  Exercise: none  Home BP readings: Patient reports 130s/70s   Wt Readings from Last 3 Encounters:  01/18/23 (!) 334 lb (151.5 kg)  12/19/22 (!) 335 lb 3.2 oz (152 kg)  11/29/22 (!) 341 lb (154.7 kg)   BP Readings from Last 3 Encounters:  02/14/23 (!) 160/84  01/18/23 (!) 157/90  12/19/22 (!) 150/70   Pulse Readings from Last 3 Encounters:  02/14/23 77  01/18/23 73  12/19/22 79    Renal function: CrCl cannot be calculated (Patient's most recent lab result is older than the maximum 21 days allowed.).  Past Medical History:  Diagnosis Date   Allergic rhinitis    Atrial fibrillation with RVR ([place]) 03/2020   CHF (congestive heart failure) ([facility])    EF 45%/ VALVULAR CM   DOE (dyspnea on exertion)    Dyspepsia    Environmental allergies    Hypertension    OA (osteoarthritis)    Obese    Preglaucoma    SOB (shortness of breath)    Uveitis    AS A TEENAGER    Current Outpatient Medications on File Prior to Visit  Medication Sig Dispense Refill   Accu-Chek Softclix  Lancets lancets Use as instructed to monitor glucose twice daily 100 each 12   acetaminophen (TYLENOL) 500 MG tablet Take 1,000 mg by mouth every 6 (six) hours as needed for moderate pain or mild pain.     amoxicillin (AMOXIL) 500 MG capsule TAKE FOUR CAPSULES BY MOUTH ONE HOUR BEFORE DENTAL APPOINTMENT 4 capsule 2   apixaban (ELIQUIS) 5 MG TABS tablet Take 1 tablet by mouth twice daily 180 tablet 1   Blood Glucose Monitoring Suppl (ACCU-CHEK GUIDE ME) w/Device KIT Use to monitor glucose twice daily 1 kit 0   colchicine 0.6 MG tablet Take 0.6 mg by mouth daily as needed (gout).     glucose blood (ACCU-CHEK GUIDE) test strip Use as instructed to monitor glucose twice daily 100 each 12   hydrochlorothiazide (HYDRODIURIL) 25 MG tablet Take 1 tablet (25 mg total)  by mouth daily. 90 tablet 3   metoprolol succinate (TOPROL-XL) 50 MG 24 hr tablet TAKE 1 & 1/2 (ONE & ONE-HALF) TABLETS BY MOUTH ONCE DAILY IMMEDIATELY FOLLOWING A MEAL 45 tablet 5   valsartan (DIOVAN) 320 MG tablet Take 1 tablet by mouth once daily 90 tablet 2   No current facility-administered medications on file prior to visit.    Allergies  Allergen Reactions   Amiodarone Other (See Comments)    Blisters and whelps to IV FORM.   Cortisone     Glaucoma HX ,  Triggers increased pressure in eye    Blood pressure (!) 160/84, pulse 77, SpO2 98 %.   Assessment/Plan:  1. Hypertension -  HTN, goal below 130/80 Assessment: Blood pressure is elevated in clinic, above goal of less than 130/80 Home blood pressures reported are better but still above goal.  Unsure of the accuracy of his home blood pressure cuff, regardless blood pressures still seem to be above goal and would warrant further treatment Patient does not love the idea of taking more medications but understands the importance of well-controlled blood pressure We discussed the many implications including risk of heart attack, stroke and effects on kidneys that a sedentary lifestyle, diabetes and high blood pressure can have Strongly encouraged patient to MAKE time to exercise.  Advised to start small walk 15 minutes 3 times a week and continue to increase the time and days per week.  Once this becomes relatively consistent would encourage him to add in strength training at least twice a week We talked about how exercise can essentially replace 1 blood pressure pill  Plan: Start amlodipine 2.5 mg daily Continue valsartan 320 mg daily, hydrochlorothiazide 25 mg daily metoprolol succinate 75 mg daily We discussed in the future possibly swapping out metoprolol for carvedilol I have asked the patient to check his blood pressure daily and to bring his blood pressure cuff and readings to next visit Incorporate exercise into his daily  routine starting with a few days a week and increasing Follow-up Friday April 26th Check BMP today since it has not been checked since starting HCTZ   Thank you  Ramond Dial, Pharm.D, BCPS, CPP [facility] [address], [place], [place] [address]  Phone: [phone]; Fax: [phone]

## 2023-02-14 NOTE — Assessment & Plan Note (Addendum)
Assessment: Blood pressure is elevated in clinic, above goal of less than 130/80 Home blood pressures reported are better but still above goal.  Unsure of the accuracy of his home blood pressure cuff, regardless blood pressures still seem to be above goal and would warrant further treatment Patient does not love the idea of taking more medications but understands the importance of well-controlled blood pressure We discussed the many implications including risk of heart attack, stroke and effects on kidneys that a sedentary lifestyle, diabetes and high blood pressure can have Strongly encouraged patient to MAKE time to exercise.  Advised to start small walk 15 minutes 3 times a week and continue to increase the time and days per week.  Once this becomes relatively consistent would encourage him to add in strength training at least twice a week We talked about how exercise can essentially replace 1 blood pressure pill  Plan: Start amlodipine 2.5 mg daily Continue valsartan 320 mg daily, hydrochlorothiazide 25 mg daily metoprolol succinate 75 mg daily We discussed in the future possibly swapping out metoprolol for carvedilol I have asked the patient to check his blood pressure daily and to bring his blood pressure cuff and readings to next visit Incorporate exercise into his daily routine starting with a few days a week and increasing Follow-up Friday April 26th Check BMP today since it has not been checked since starting HCTZ

## 2023-02-14 NOTE — Patient Instructions (Signed)
Summary of today's discussion  1.START taking amlodipine 2.5mg  daily  2.Continue valsartan 320 mg daily, hydrochlorothiazide 25 mg daily metoprolol succinate 75 mg daily  3. Please bring blood pressure cuff and log to next visit  4.Add exercise to your daily routine  5.   Your blood pressure goal is <130/80  To check your pressure at home you will need to:  1. Sit up in a chair, with feet flat on the floor and back supported. Do not cross your ankles or legs. 2. Rest your left arm so that the cuff is about heart level. If the cuff goes on your upper arm,  then just relax the arm on the table, arm of the chair or your lap. If you have a wrist cuff, we  suggest relaxing your wrist against your chest (think of it as Pledging the Flag with the  wrong arm).  3. Place the cuff snugly around your arm, about 1 inch above the crook of your elbow. The  cords should be inside the groove of your elbow.  4. Sit quietly, with the cuff in place, for about 5 minutes. After that 5 minutes press the power  button to start a reading. 5. Do not talk or move while the reading is taking place.  6. Record your readings on a sheet of paper. Although most cuffs have a memory, it is often  easier to see a pattern developing when the numbers are all in front of you.  7. You can repeat the reading after 1-3 minutes if it is recommended  Make sure your bladder is empty and you have not had caffeine or tobacco within the last 30 min  Always bring your blood pressure log with you to your appointments. If you have not brought your monitor in to be double checked for accuracy, please bring it to your next appointment.  You can find a list of validated (accurate) blood pressure cuffs at [employer]   Important lifestyle changes to control high blood pressure  Intervention  Effect on the BP  Lose extra pounds and watch your waistline Weight loss is one of the most effective lifestyle changes for controlling  blood pressure. If you're overweight or obese, losing even a small amount of weight can help reduce blood pressure. Blood pressure might go down by about 1 millimeter of mercury (mm Hg) with each kilogram (about 2.2 pounds) of weight lost.  Exercise regularly As a general goal, aim for at least 30 minutes of moderate physical activity every day. Regular physical activity can lower high blood pressure by about 5 to 8 mm Hg.  Eat a healthy diet Eating a diet rich in whole grains, fruits, vegetables, and low-fat dairy products and low in saturated fat and cholesterol. A healthy diet can lower high blood pressure by up to 11 mm Hg.  Reduce salt (sodium) in your diet Even a small reduction of sodium in the diet can improve heart health and reduce high blood pressure by about 5 to 6 mm Hg.  Limit alcohol One drink equals 12 ounces of beer, 5 ounces of wine, or 1.5 ounces of 80-proof liquor.  Limiting alcohol to less than one drink a day for women or two drinks a day for men can help lower blood pressure by about 4 mm Hg.   Please call me at [phone] with any questions.

## 2023-02-15 LAB — BASIC METABOLIC PANEL
BUN/Creatinine Ratio: 20 (ref 10–24)
BUN: 29 mg/dL — ABNORMAL HIGH (ref 8–27)
CO2: 22 mmol/L (ref 20–29)
Calcium: 9.9 mg/dL (ref 8.6–10.2)
Chloride: 100 mmol/L (ref 96–106)
Creatinine, Ser: 1.42 mg/dL — ABNORMAL HIGH (ref 0.76–1.27)
Glucose: 216 mg/dL — ABNORMAL HIGH (ref 70–99)
Potassium: 5.3 mmol/L — ABNORMAL HIGH (ref 3.5–5.2)
Sodium: 142 mmol/L (ref 134–144)
eGFR: 56 mL/min/{1.73_m2} — ABNORMAL LOW (ref >59)

## 2023-02-16 ENCOUNTER — Telehealth: Payer: Self-pay | Admitting: Pharmacist

## 2023-02-16 DIAGNOSIS — I5043 Acute on chronic combined systolic (congestive) and diastolic (congestive) heart failure: Secondary | ICD-10-CM

## 2023-02-16 NOTE — Telephone Encounter (Signed)
Patient called requesting to re-review labs and recommendations. Reviewed with patient.  Plan remains the same

## 2023-02-16 NOTE — Telephone Encounter (Signed)
Called patient to review labs.  Potassium is 5.3.  Patient is on valsartan 320 mg daily.  Was started on HCTZ 25 mg daily few months ago.  In the past his potassium has been on the higher end, surprised with the HCTZ that it is high.  Patient does admit that he does eat a fair amount of tomatoes and potatoes and is willing to cut back.  We did also discuss changing HCTZ to chlorthalidone for possible better blood pressure lowering and more potassium removal.  She started him on amlodipine 2.5 mg 2 days ago.  Patient would prefer to try to cut back on potassium rich foods.  Will recheck in 1 week if still high will either have to cut back on valsartan or try swapping HCTZ for chlorthalidone.

## 2023-02-20 NOTE — Addendum Note (Signed)
Addended by: Malena Peer D on: 02/20/2023 10:07 AM   Modules accepted: Orders

## 2023-02-23 ENCOUNTER — Ambulatory Visit: Payer: BLUE CROSS/BLUE SHIELD | Attending: Cardiology

## 2023-02-23 DIAGNOSIS — I5043 Acute on chronic combined systolic (congestive) and diastolic (congestive) heart failure: Secondary | ICD-10-CM

## 2023-02-24 LAB — BASIC METABOLIC PANEL
BUN/Creatinine Ratio: 18 (ref 10–24)
BUN: 25 mg/dL (ref 8–27)
CO2: 23 mmol/L (ref 20–29)
Calcium: 9.9 mg/dL (ref 8.6–10.2)
Chloride: 97 mmol/L (ref 96–106)
Creatinine, Ser: 1.37 mg/dL — ABNORMAL HIGH (ref 0.76–1.27)
Glucose: 189 mg/dL — ABNORMAL HIGH (ref 70–99)
Potassium: 5.3 mmol/L — ABNORMAL HIGH (ref 3.5–5.2)
Sodium: 140 mmol/L (ref 134–144)
eGFR: 58 mL/min/{1.73_m2} — ABNORMAL LOW (ref >59)

## 2023-02-24 NOTE — Addendum Note (Signed)
Addended by: Malena Peer D on: 02/24/2023 01:28 PM   Modules accepted: Orders

## 2023-02-24 NOTE — Telephone Encounter (Addendum)
K still high. Will decrease valsartan to 160mg . Increase amlodipine to 5mg  daily. Pt aware of results and in agreement with plan.Pt already have f/u with me 4/26.

## 2023-03-10 ENCOUNTER — Ambulatory Visit: Payer: BLUE CROSS/BLUE SHIELD | Attending: Cardiology | Admitting: Pharmacist

## 2023-03-10 VITALS — BP 140/80 | HR 70

## 2023-03-10 DIAGNOSIS — I10 Essential (primary) hypertension: Secondary | ICD-10-CM

## 2023-03-10 NOTE — Patient Instructions (Addendum)
Summary of today's discussion  1.Please pick up a new BP cuff  2.Make sure you rest 5 min before checking  3. Continue valsartan 160 mg daily, hydrochlorothiazide 25 mg daily, metoprolol succinate 75 mg daily, amlodipine 5 mg daily  4. Try to go for a walk daily  5.Send me blood pressure readings in 1 week   Your blood pressure goal is <130/80  To check your pressure at home you will need to:  1. Sit up in a chair, with feet flat on the floor and back supported. Do not cross your ankles or legs. 2. Rest your left arm so that the cuff is about heart level. If the cuff goes on your upper arm,  then just relax the arm on the table, arm of the chair or your lap. If you have a wrist cuff, we  suggest relaxing your wrist against your chest (think of it as Pledging the Flag with the  wrong arm).  3. Place the cuff snugly around your arm, about 1 inch above the crook of your elbow. The  cords should be inside the groove of your elbow.  4. Sit quietly, with the cuff in place, for about 5 minutes. After that 5 minutes press the power  button to start a reading. 5. Do not talk or move while the reading is taking place.  6. Record your readings on a sheet of paper. Although most cuffs have a memory, it is often  easier to see a pattern developing when the numbers are all in front of you.  7. You can repeat the reading after 1-3 minutes if it is recommended  Make sure your bladder is empty and you have not had caffeine or tobacco within the last 30 min  Always bring your blood pressure log with you to your appointments. If you have not brought your monitor in to be double checked for accuracy, please bring it to your next appointment.  You can find a list of validated (accurate) blood pressure cuffs at [employer]   Important lifestyle changes to control high blood pressure  Intervention  Effect on the BP  Lose extra pounds and watch your waistline Weight loss is one of the most effective  lifestyle changes for controlling blood pressure. If you're overweight or obese, losing even a small amount of weight can help reduce blood pressure. Blood pressure might go down by about 1 millimeter of mercury (mm Hg) with each kilogram (about 2.2 pounds) of weight lost.  Exercise regularly As a general goal, aim for at least 30 minutes of moderate physical activity every day. Regular physical activity can lower high blood pressure by about 5 to 8 mm Hg.  Eat a healthy diet Eating a diet rich in whole grains, fruits, vegetables, and low-fat dairy products and low in saturated fat and cholesterol. A healthy diet can lower high blood pressure by up to 11 mm Hg.  Reduce salt (sodium) in your diet Even a small reduction of sodium in the diet can improve heart health and reduce high blood pressure by about 5 to 6 mm Hg.  Limit alcohol One drink equals 12 ounces of beer, 5 ounces of wine, or 1.5 ounces of 80-proof liquor.  Limiting alcohol to less than one drink a day for women or two drinks a day for men can help lower blood pressure by about 4 mm Hg.   Please call me at [phone] with any questions.

## 2023-03-10 NOTE — Assessment & Plan Note (Signed)
Assessment: Blood pressure above goal in clinic Home cuff found to run high Patient encouraged to try going for a walk to increase exercise and lower stress Boxed breathing to help with stress Reviewed that although I'm sure stress is a big part of his HTN, will need to treat until he can eliminate his stress. If then BP comes down, can decrease meds  Plan: Patient to get new BP cuff and send me readings in 1 week BMP today to make sure hyperkalemia resolves Can increase amlodipine if needed Will schedule follow up based on labs and BP readings at 1 week

## 2023-03-10 NOTE — Progress Notes (Unsigned)
Patient ID: Henry Dougherty                 DOB: 1961/05/27                      MRN: [id number]      HPI: Henry Dougherty is a 62 y.o. male referred by Dr. Anne Fu to HTN clinic. PMH is significant for mitral valve repair, afib, PFO closure, Maze procedure, CHF, prediabetes and HTN.  Patient was seen by Dr. Anne Fu in January where he reported elevated blood pressures around Christmas time.  Blood pressure in clinic that day was 170/98.  HCTZ 25 mg daily was started.  He has been seen twice by endocrinology since visit with Dr. Anne Fu.  Unfortunately his A1c has increased from 6.2 to 6.7.  He has implemented some dietary changes but has not implemented any physical activity.  At last Pharm.D. visit blood pressure was 160/84.  His home blood pressures were better but still above goal.  Amlodipine 2.5 mg daily was started and patient was strongly encouraged to add in exercise to his regimen.  Unfortunately his labs showed elevated potassium that did not improve on recheck.  Therefore, valsartan was decreased to 160 mg and amlodipine was increased to 5 mg daily.  Patient presents today accompanied by his wife. Wife is concerned about his blood pressure, blood sugar and the affect of his medications on his body. Is concerned about him being on a diuretic and ARB and his kidney function. Also concerned his metoprolol is having an effect on his blood sugar. All these concerns were addressed. Patient and wife are from [place], [place]. Moved here for their kids.   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 He went inside and took his BP which was 108 systolic. This was the day he increased amlodipine to 5mg . He was traveling to [place] the next day, so he only took 2.5mg  while in [place] and then increased to 5mg  once he got back. First 2 days BP was good but then increased back to the 150's. He does not rest long prior to checking. Not doing much exercise. He is under a great deal of stress  with his job. Will be retiring early in Sept due to the stress. Feels like it is the cause of his HTN and pre-DM.   He brought in his home BP cuff. OMRON. Unfortunately it read much higher than manual clinic reading. Patient will get a new cuff.  Denies any swelling, only 1 episode of dizziness.  Current HTN meds: valsartan 160 mg daily, hydrochlorothiazide 25 mg daily, metoprolol succinate 75 mg daily, amlodipine 5 mg daily Previously tried: amlodipine 10 mg (pt doesn't remember), valsartan 320 mg(hyperkalemia, K5.3) BP goal: <130/80  Family History: The patient's family history includes Atrial fibrillation in his mother; Colon cancer in his father; Heart Problems in his father and mother; Heart attack in his maternal grandfather and paternal grandfather; Stroke in his paternal grandfather. There is no history of Pancreatic cancer, Esophageal cancer, Liver cancer, or Stomach cancer.   Social History: 1-2 glassed of wine maybe once a week  Diet: Not discussed in great detail since he has been working with endocrinology on this.   Exercise: none  Home BP readings:  123 67  136 77  140 71  151 77  140 77  151 76     Wt Readings from Last 3 Encounters:  01/18/23 Marland Kitchen)  334 lb (151.5 kg)  12/19/22 (!) 335 lb 3.2 oz (152 kg)  11/29/22 (!) 341 lb (154.7 kg)   BP Readings from Last 3 Encounters:  03/10/23 (!) 140/80  02/14/23 (!) 160/84  01/18/23 (!) 157/90   Pulse Readings from Last 3 Encounters:  03/10/23 70  02/14/23 77  01/18/23 73    Renal function: CrCl cannot be calculated (Unknown ideal weight.).  Past Medical History:  Diagnosis Date   Allergic rhinitis    Atrial fibrillation with RVR (HCC) 03/2020   CHF (congestive heart failure) (HCC)    EF 45%/ VALVULAR CM   DOE (dyspnea on exertion)    Dyspepsia    Environmental allergies    Hypertension    OA (osteoarthritis)    Obese    Preglaucoma    SOB (shortness of breath)    Uveitis    AS A TEENAGER     Current Outpatient Medications on File Prior to Visit  Medication Sig Dispense Refill   Accu-Chek Softclix Lancets lancets Use as instructed to monitor glucose twice daily 100 each 12   acetaminophen (TYLENOL) 500 MG tablet Take 1,000 mg by mouth every 6 (six) hours as needed for moderate pain or mild pain.     amLODipine (NORVASC) 2.5 MG tablet Take 2 tablets (5 mg total) by mouth daily. 90 tablet 3   amoxicillin (AMOXIL) 500 MG capsule TAKE FOUR CAPSULES BY MOUTH ONE HOUR BEFORE DENTAL APPOINTMENT 4 capsule 2   apixaban (ELIQUIS) 5 MG TABS tablet Take 1 tablet by mouth twice daily 180 tablet 1   Blood Glucose Monitoring Suppl (ACCU-CHEK GUIDE ME) w/Device KIT Use to monitor glucose twice daily 1 kit 0   colchicine 0.6 MG tablet Take 0.6 mg by mouth daily as needed (gout).     glucose blood (ACCU-CHEK GUIDE) test strip Use as instructed to monitor glucose twice daily 100 each 12   hydrochlorothiazide (HYDRODIURIL) 25 MG tablet Take 1 tablet (25 mg total) by mouth daily. 90 tablet 3   metoprolol succinate (TOPROL-XL) 50 MG 24 hr tablet TAKE 1 & 1/2 (ONE & ONE-HALF) TABLETS BY MOUTH ONCE DAILY IMMEDIATELY FOLLOWING A MEAL 45 tablet 5   valsartan (DIOVAN) 320 MG tablet Take 0.5 tablets (160 mg total) by mouth daily. 90 tablet 2   No current facility-administered medications on file prior to visit.    Allergies  Allergen Reactions   Amiodarone Other (See Comments)    Blisters and whelps to IV FORM.   Cortisone     Glaucoma HX ,  Triggers increased pressure in eye    Blood pressure (!) 140/80, pulse 70, SpO2 97 %.   Assessment/Plan:  1. Hypertension -  HTN, goal below 130/80 Assessment: Blood pressure above goal in clinic Home cuff found to run high Patient encouraged to try going for a walk to increase exercise and lower stress Boxed breathing to help with stress Reviewed that although I'm sure stress is a big part of his HTN, will need to treat until he can eliminate his  stress. If then BP comes down, can decrease meds  Plan: Patient to get new BP cuff and send me readings in 1 week BMP today to make sure hyperkalemia resolves Can increase amlodipine if needed Will schedule follow up based on labs and BP readings at 1 week    Thank you  Olene Floss, Pharm.D, BCPS, CPP [facility] A Division of [facility] [address], [place], [place] [address]  Phone: [phone]; Fax: [phone]

## 2023-03-11 LAB — BASIC METABOLIC PANEL
BUN/Creatinine Ratio: 17 (ref 10–24)
BUN: 26 mg/dL (ref 8–27)
CO2: 25 mmol/L (ref 20–29)
Calcium: 10.4 mg/dL — ABNORMAL HIGH (ref 8.6–10.2)
Chloride: 98 mmol/L (ref 96–106)
Creatinine, Ser: 1.53 mg/dL — ABNORMAL HIGH (ref 0.76–1.27)
Glucose: 208 mg/dL — ABNORMAL HIGH (ref 70–99)
Potassium: 5.2 mmol/L (ref 3.5–5.2)
Sodium: 139 mmol/L (ref 134–144)
eGFR: 51 mL/min/{1.73_m2} — ABNORMAL LOW (ref >59)

## 2023-03-13 ENCOUNTER — Telehealth: Payer: Self-pay | Admitting: Pharmacist

## 2023-03-13 NOTE — Telephone Encounter (Signed)
Spoke with patient. Reviewed labs. BP has been in the 120's the last few days with his new cuff. Will hold off on any changes for right now. Pt will send me readings in 1 week. F/u in office in 3 weeks.

## 2023-03-13 NOTE — Telephone Encounter (Signed)
K has improved slightly down to 5.2 with reducing valsartan. Ca slightly high. Scr around baseline. Sugar is high.  Nephrologist? Change HCTZ to chlorthalidone 12.5mg  vs increasing amlodipine LVM for pt to call back

## 2023-03-20 ENCOUNTER — Encounter: Payer: Self-pay | Admitting: Pharmacist

## 2023-04-03 ENCOUNTER — Ambulatory Visit: Payer: BLUE CROSS/BLUE SHIELD | Attending: Internal Medicine | Admitting: Pharmacist

## 2023-04-03 VITALS — BP 120/70 | HR 73

## 2023-04-03 DIAGNOSIS — I10 Essential (primary) hypertension: Secondary | ICD-10-CM | POA: Diagnosis not present

## 2023-04-03 NOTE — Progress Notes (Unsigned)
Patient ID: MARCIN MCELHENNEY                 DOB: 22-Jul-1961                      MRN: [id number]      HPI: Henry Dougherty is a 62 y.o. male referred by Dr. Anne Fu to HTN clinic. PMH is significant for mitral valve repair, afib, PFO closure, Maze procedure, CHF, prediabetes and HTN.  Patient was seen by Dr. Anne Fu in January where he reported elevated blood pressures around Christmas time.  Blood pressure in clinic that day was 170/98.  HCTZ 25 mg daily was started.  He has been seen twice by endocrinology since visit with Dr. Anne Fu.  Unfortunately his A1c has increased from 6.2 to 6.7.  He has implemented some dietary changes but has not implemented any physical activity.  At [facility] visit 02/14/23, blood pressure was 160/84.  His home blood pressures were better but still above goal.  Amlodipine 2.5 mg daily was started and patient was strongly encouraged to add in exercise to his regimen.  Unfortunately his labs showed elevated potassium that did not improve on recheck.  Therefore, valsartan was decreased to 160 mg and amlodipine was increased to 5 mg daily.  At last visit 4/26, blood pressure was 140/80 in the office. Home cuff was inaccurate. Patient got a new BP cuff. BP readings sent via mychart 5/6 were 123-134/65-77. No medication changes were made.   Patient presents today with his wife. He reports feeling good. Able to take the trash cans out without getting out of breath. He has been watching his diet and trying to make good decisions while eating out in [place]. He has also been doing more yard work. Congratulated patient on his efforts and encouraged him to celebrate every victory.   Wife and patient asked about potential DM medications since his A1C has increased again. We discussed the importance on lifestyle changes which he is trying hard to implement. We discussed metformin vs GLP-1. We talked about the importance of both aerobic and resistance training on insulin sensitivity and  weight loss/sustainability.  States that his dizziness is much better. No swelling or headaches. Home blood pressures are in the 120's/70's. Home cuff found to be accurate.  Current HTN meds: valsartan 160 mg daily, hydrochlorothiazide 25 mg daily, metoprolol succinate 75 mg daily, amlodipine 5 mg daily Previously tried: amlodipine 10 mg (pt doesn't remember), valsartan 320 mg(hyperkalemia, K5.3) BP goal: <130/80  Family History: The patient's family history includes Atrial fibrillation in his mother; Colon cancer in his father; Heart Problems in his father and mother; Heart attack in his maternal grandfather and paternal grandfather; Stroke in his paternal grandfather. There is no history of Pancreatic cancer, Esophageal cancer, Liver cancer, or Stomach cancer.   Social History: 1-2 glassed of wine maybe once a week  Diet: Not discussed in great detail since he has been working with endocrinology on this.   Exercise: none  Home Readings: 130's if he hasn't rested. 120's systolic after resting 5 min   Wt Readings from Last 3 Encounters:  01/18/23 (!) 334 lb (151.5 kg)  12/19/22 (!) 335 lb 3.2 oz (152 kg)  11/29/22 (!) 341 lb (154.7 kg)   BP Readings from Last 3 Encounters:  04/03/23 120/70  03/10/23 (!) 140/80  02/14/23 (!) 160/84   Pulse Readings from Last 3 Encounters:  04/03/23 73  03/10/23 70  02/14/23 77  Renal function: CrCl cannot be calculated (Patient's most recent lab result is older than the maximum 21 days allowed.).  Past Medical History:  Diagnosis Date   Allergic rhinitis    Atrial fibrillation with RVR (HCC) 03/2020   CHF (congestive heart failure) (HCC)    EF 45%/ VALVULAR CM   DOE (dyspnea on exertion)    Dyspepsia    Environmental allergies    Hypertension    OA (osteoarthritis)    Obese    Preglaucoma    SOB (shortness of breath)    Uveitis    AS A TEENAGER    Current Outpatient Medications on File Prior to Visit  Medication Sig Dispense  Refill   Accu-Chek Softclix Lancets lancets Use as instructed to monitor glucose twice daily 100 each 12   acetaminophen (TYLENOL) 500 MG tablet Take 1,000 mg by mouth every 6 (six) hours as needed for moderate pain or mild pain.     amLODipine (NORVASC) 2.5 MG tablet Take 2 tablets (5 mg total) by mouth daily. 90 tablet 3   amoxicillin (AMOXIL) 500 MG capsule TAKE FOUR CAPSULES BY MOUTH ONE HOUR BEFORE DENTAL APPOINTMENT 4 capsule 2   apixaban (ELIQUIS) 5 MG TABS tablet Take 1 tablet by mouth twice daily 180 tablet 1   Blood Glucose Monitoring Suppl (ACCU-CHEK GUIDE ME) w/Device KIT Use to monitor glucose twice daily 1 kit 0   colchicine 0.6 MG tablet Take 0.6 mg by mouth daily as needed (gout).     glucose blood (ACCU-CHEK GUIDE) test strip Use as instructed to monitor glucose twice daily 100 each 12   hydrochlorothiazide (HYDRODIURIL) 25 MG tablet Take 1 tablet (25 mg total) by mouth daily. 90 tablet 3   metoprolol succinate (TOPROL-XL) 50 MG 24 hr tablet TAKE 1 & 1/2 (ONE & ONE-HALF) TABLETS BY MOUTH ONCE DAILY IMMEDIATELY FOLLOWING A MEAL 45 tablet 5   valsartan (DIOVAN) 320 MG tablet Take 0.5 tablets (160 mg total) by mouth daily. 90 tablet 2   No current facility-administered medications on file prior to visit.    Allergies  Allergen Reactions   Amiodarone Other (See Comments)    Blisters and whelps to IV FORM.   Cortisone     Glaucoma HX ,  Triggers increased pressure in eye    Blood pressure 120/70, pulse 73.   Assessment/Plan:  1. Hypertension -  HTN, goal below 130/80 Assessment: Blood pressure at goal of <130/80 Patient has started to increase his physical activity. I have encouraged him to continue to work to increase. Celebrate every victory and every pound lost Home readings at goal of <130/80  Plan: Continue  valsartan 160 mg daily, hydrochlorothiazide 25 mg daily, metoprolol succinate 75 mg daily, amlodipine 5 mg daily Continue to check blood pressure a few  times a week Patient will send me BP readings Follow up as needed  Thank you  Olene Floss, Pharm.D, BCPS, CPP [facility] A Division of [facility] [address] [place], [place] [address]  Phone: [phone]; Fax: [phone]

## 2023-04-03 NOTE — Assessment & Plan Note (Signed)
Assessment: Blood pressure at goal of <130/80 Patient has started to increase his physical activity. I have encouraged him to continue to work to increase. Celebrate every victory and every pound lost Home readings at goal of <130/80  Plan: Continue  valsartan 160 mg daily, hydrochlorothiazide 25 mg daily, metoprolol succinate 75 mg daily, amlodipine 5 mg daily Continue to check blood pressure a few times a week Patient will send me BP readings Follow up as needed

## 2023-04-04 ENCOUNTER — Encounter: Payer: Self-pay | Admitting: Pharmacist

## 2023-04-06 MED ORDER — AMLODIPINE BESYLATE 5 MG PO TABS: 5.0000 mg | ORAL_TABLET | Freq: Every day | ORAL | 3 refills | Status: DC

## 2023-04-09 ENCOUNTER — Other Ambulatory Visit: Payer: Self-pay | Admitting: Cardiology

## 2023-04-20 ENCOUNTER — Encounter: Payer: Self-pay | Admitting: Nurse Practitioner

## 2023-04-20 ENCOUNTER — Ambulatory Visit: Payer: BLUE CROSS/BLUE SHIELD | Admitting: Nurse Practitioner

## 2023-04-20 VITALS — BP 124/77 | HR 64 | Ht 69.0 in | Wt 334.8 lb

## 2023-04-20 DIAGNOSIS — E785 Hyperlipidemia, unspecified: Secondary | ICD-10-CM

## 2023-04-20 DIAGNOSIS — Z7985 Long-term (current) use of injectable non-insulin antidiabetic drugs: Secondary | ICD-10-CM

## 2023-04-20 DIAGNOSIS — I10 Essential (primary) hypertension: Secondary | ICD-10-CM

## 2023-04-20 DIAGNOSIS — E119 Type 2 diabetes mellitus without complications: Secondary | ICD-10-CM

## 2023-04-20 DIAGNOSIS — R7303 Prediabetes: Secondary | ICD-10-CM

## 2023-04-20 LAB — POCT GLYCOSYLATED HEMOGLOBIN (HGB A1C): Hemoglobin A1C: 6.2 % — AB (ref 4.0–5.6)

## 2023-04-20 MED ORDER — TIRZEPATIDE 2.5 MG/0.5ML ~~LOC~~ SOAJ: 2.5000 mg | SUBCUTANEOUS | 0 refills | Status: DC

## 2023-04-20 MED ORDER — TIRZEPATIDE 5 MG/0.5ML ~~LOC~~ SOAJ: 5.0000 mg | SUBCUTANEOUS | 1 refills | Status: DC

## 2023-04-20 NOTE — Progress Notes (Signed)
Endocrinology Follow Up Note       04/20/2023, 8:42 AM   Subjective:    Patient ID: Henry Dougherty, male    DOB: 30-May-1961.  Henry Dougherty is being seen in follow up after being seen for management of prediabetes requested by  Creola Corn, MD.   Past Medical History:  Diagnosis Date   Allergic rhinitis    Atrial fibrillation with RVR (HCC) 03/2020   CHF (congestive heart failure) (HCC)    EF 45%/ VALVULAR CM   DOE (dyspnea on exertion)    Dyspepsia    Environmental allergies    Hypertension    OA (osteoarthritis)    Obese    Preglaucoma    SOB (shortness of breath)    Uveitis    AS A TEENAGER    Past Surgical History:  Procedure Laterality Date   CARDIAC SURGERY     CARDIOVERSION N/A 04/01/2020   Procedure: CARDIOVERSION;  Surgeon: Sande Rives, MD;  Location: [facility] ENDOSCOPY;  Service: Cardiovascular;  Laterality: N/A;   COLONOSCOPY     PT WAS 47 IN [place]- 2-3 TINY POLYPS   COLONOSCOPY WITH PROPOFOL N/A 04/21/2022   Procedure: COLONOSCOPY WITH PROPOFOL;  Surgeon: Jenel Lucks, MD;  Location: [facility] ENDOSCOPY;  Service: Gastroenterology;  Laterality: N/A;   KNEE ARTHROSCOPY Right 2005   MITRAL VALVE REPAIR     POLYPECTOMY  04/21/2022   Procedure: POLYPECTOMY;  Surgeon: Jenel Lucks, MD;  Location: [facility] ENDOSCOPY;  Service: Gastroenterology;;   SHOULDER ARTHROSCOPY Right 2005   TEE WITHOUT CARDIOVERSION N/A 04/01/2016   Procedure: TRANSESOPHAGEAL ECHOCARDIOGRAM (TEE);  Surgeon: Jake Bathe, MD;  Location: [facility] ENDOSCOPY;  Service: Cardiovascular;  Laterality: N/A;   TEE WITHOUT CARDIOVERSION N/A 04/01/2020   Procedure: TRANSESOPHAGEAL ECHOCARDIOGRAM (TEE);  Surgeon: Sande Rives, MD;  Location: [facility] ENDOSCOPY;  Service: Cardiovascular;  Laterality: N/A;    Social History   Socioeconomic History   Marital status: Married    Spouse name: Not on file   Number of children: 3   Years  of education: Not on file   Highest education level: Not on file  Occupational History   Occupation: VP FOR [employer]  Tobacco Use   Smoking status: Never   Smokeless tobacco: Never  Vaping Use   Vaping Use: Never used  Substance and Sexual Activity   Alcohol use: Yes   Drug use: No   Sexual activity: Not on file  Other Topics Concern   Not on file  Social History Narrative   Not on file   Social Determinants of Health   Financial Resource Strain: Not on file  Food Insecurity: Not on file  Transportation Needs: Not on file  Physical Activity: Not on file  Stress: Not on file  Social Connections: Not on file    Family History  Problem Relation Age of Onset   Heart Problems Mother    Atrial fibrillation Mother    Colon cancer Father    Heart Problems Father    Heart attack Maternal Grandfather        died from heart attack in his 21's   Heart attack Paternal Grandfather    Stroke Paternal  Grandfather    Pancreatic cancer Neg Hx    Esophageal cancer Neg Hx    Liver cancer Neg Hx    Stomach cancer Neg Hx     Outpatient Encounter Medications as of 04/20/2023  Medication Sig   Accu-Chek Softclix Lancets lancets Use as instructed to monitor glucose twice daily   acetaminophen (TYLENOL) 500 MG tablet Take 1,000 mg by mouth every 6 (six) hours as needed for moderate pain or mild pain.   amLODipine (NORVASC) 5 MG tablet Take 1 tablet (5 mg total) by mouth daily.   amoxicillin (AMOXIL) 500 MG capsule TAKE FOUR CAPSULES BY MOUTH ONE HOUR BEFORE DENTAL APPOINTMENT   apixaban (ELIQUIS) 5 MG TABS tablet Take 1 tablet by mouth twice daily   colchicine 0.6 MG tablet Take 0.6 mg by mouth daily as needed (gout).   glucose blood (ACCU-CHEK GUIDE) test strip Use as instructed to monitor glucose twice daily   hydrochlorothiazide (HYDRODIURIL) 25 MG tablet Take 1 tablet (25 mg total) by mouth daily.   metoprolol succinate (TOPROL-XL) 50 MG 24 hr tablet TAKE 1 & 1/2 (ONE & ONE-HALF)  TABLETS BY MOUTH ONCE DAILY IMMEDIATELY FOLLOWING A MEAL   tirzepatide (MOUNJARO) 2.5 MG/0.5ML Pen Inject 2.5 mg into the skin once a week.   tirzepatide [facility]) 5 MG/0.5ML Pen Inject 5 mg into the skin once a week.   valsartan (DIOVAN) 320 MG tablet Take 0.5 tablets (160 mg total) by mouth daily.   Blood Glucose Monitoring Suppl (ACCU-CHEK GUIDE ME) w/Device KIT Use to monitor glucose twice daily   No facility-administered encounter medications on file as of 04/20/2023.    ALLERGIES: Allergies  Allergen Reactions   Amiodarone Other (See Comments)    Blisters and whelps to IV FORM.   Cortisone     Glaucoma HX ,  Triggers increased pressure in eye    VACCINATION STATUS:  There is no immunization history on file for this patient.  Diabetes He presents for his follow-up diabetic visit. Diabetes type: prediabetes. Onset time: prediabetes since age 29. His disease course has been improving. There are no hypoglycemic associated symptoms. Associated symptoms include foot paresthesias. Pertinent negatives for diabetes include no weight loss (slow). Symptoms are stable. Diabetic complications include heart disease (CHF- s/p mitral valve repair) and nephropathy. Risk factors for coronary artery disease include dyslipidemia, male sex, obesity, hypertension and sedentary lifestyle. Current diabetic treatment includes diet. He is compliant with treatment most of the time. His weight is fluctuating minimally. He is following a generally healthy and diabetic diet. Meal planning includes avoidance of concentrated sweets. He has not had a previous visit with a dietitian. He rarely participates in exercise. His breakfast blood glucose range is generally 140-180 mg/dl. His bedtime blood glucose range is generally 110-130 mg/dl. (He presents today, accompanied by his wife, with his logs showing stable, slightly above target fasting glycemic profile.  His POCT A1c today is 6.2%, improving from last visit of 6.7%.   He notes he has just recently started exercising (got exercise bike at home) and continues to work on [employer].  He denies any significant hypoglycemia.) An ACE inhibitor/angiotensin II receptor blocker is being taken. He does not see a podiatrist.Eye exam is not current.     Review of systems  Constitutional: + stable body weight, current Body mass index is 49.44 kg/m., no fatigue, no subjective hyperthermia, no subjective hypothermia Eyes: no blurry vision, no xerophthalmia ENT: no sore throat, no nodules palpated in throat, no dysphagia/odynophagia, no hoarseness  Cardiovascular: no chest pain, no shortness of breath, no palpitations, no leg swelling Respiratory: no cough, no shortness of breath Gastrointestinal: no nausea/vomiting/diarrhea Musculoskeletal: no muscle/joint aches Skin: no rashes, no hyperemia Neurological: no tremors, + numbness/tingling/nerve pain to bilateral feet- worse at night (somewhat improved with OTC Magnesium supplement), no dizziness Psychiatric: no depression, no anxiety  Objective:     BP 124/77 (BP Location: Left Arm, Patient Position: Sitting, Cuff Size: Large)   Pulse 64   Ht 5\' 9"  (1.753 m)   Wt (!) 334 lb 12.8 oz (151.9 kg)   BMI 49.44 kg/m   Wt Readings from Last 3 Encounters:  04/20/23 (!) 334 lb 12.8 oz (151.9 kg)  01/18/23 (!) 334 lb (151.5 kg)  12/19/22 (!) 335 lb 3.2 oz (152 kg)     BP Readings from Last 3 Encounters:  04/20/23 124/77  04/03/23 120/70  03/10/23 (!) 140/80     Physical Exam- Limited  Constitutional:  Body mass index is 49.44 kg/m. , not in acute distress, normal state of mind Eyes:  EOMI, no exophthalmos Musculoskeletal: no gross deformities, strength intact in all four extremities, no gross restriction of joint movements Skin:  no rashes, no hyperemia Neurological: no tremor with outstretched hands   Diabetic Foot Exam - Simple   No data filed     CMP ( most recent) CMP     Component Value  Date/Time   NA 139 03/10/2023 0950   K 5.2 03/10/2023 0950   CL 98 03/10/2023 0950   CO2 25 03/10/2023 0950   GLUCOSE 208 (H) 03/10/2023 0950   GLUCOSE 184 (H) 01/22/2021 0931   BUN 26 03/10/2023 0950   CREATININE 1.53 (H) 03/10/2023 0950   CREATININE 2.19 (H) 04/13/2016 1048   CALCIUM 10.4 (H) 03/10/2023 0950   PROT 6.3 (L) 01/22/2021 0931   ALBUMIN 3.7 01/22/2021 0931   AST 21 01/22/2021 0931   ALT 37 01/22/2021 0931   ALKPHOS 41 01/22/2021 0931   BILITOT 1.0 01/22/2021 0931   GFRNONAA 49 (L) 01/22/2021 0931   GFRAA 71 05/07/2020 1156     Diabetic Labs (most recent): Lab Results  Component Value Date   HGBA1C 6.2 (A) 04/20/2023   HGBA1C 6.7 (A) 01/18/2023   HGBA1C 6.2 10/04/2022     Lipid Panel ( most recent) Lipid Panel     Component Value Date/Time   TRIG 175 (A) 10/04/2022 0000   LDLCALC 128 10/04/2022 0000      Lab Results  Component Value Date   TSH 1.459 03/30/2020   TSH 2.459 03/31/2016           Assessment & Plan:   1) Type 2 Diabetes mellitus without complication, without long-term current use of insulin  He presents today, accompanied by his wife, with his logs showing stable, slightly above target fasting glycemic profile.  His POCT A1c today is 6.2%, improving from last visit of 6.7%.  He notes he has just recently started exercising (got exercise bike at home) and continues to work on [employer].  He denies any significant hypoglycemia.  - DAMARIAN AREY has diabetes since 63 years of age.   -Recent labs reviewed.  - I had a long discussion with him about the progressive nature of diabetes and the pathology behind its complications. -his prediabetes is complicated by CHF, CKD stage 3a and he remains at a high risk for more acute and chronic complications which include CAD, CVA, CKD, retinopathy, and neuropathy. These are all discussed in detail  with him.  The following Lifestyle Medicine recommendations according to American College of  Lifestyle Medicine [facility]) were discussed and offered to patient and he agrees to start the journey:  A. Whole Foods, Plant-based plate comprising of fruits and vegetables, plant-based proteins, whole-grain carbohydrates was discussed in detail with the patient.   A list for source of those nutrients were also provided to the patient.  Patient will use only water or unsweetened tea for hydration. B.  The need to stay away from risky substances including alcohol, smoking; obtaining 7 to 9 hours of restorative sleep, at least 150 minutes of moderate intensity exercise weekly, the importance of healthy social connections,  and stress reduction techniques were discussed. C.  A full color page of  Calorie density of various food groups per pound showing examples of each food groups was provided to the patient.  - Nutritional counseling repeated at each appointment due to patients tendency to fall back in to old habits.  - The patient admits there is a room for improvement in their diet and drink choices. -  Suggestion is made for the patient to avoid simple carbohydrates from their diet including Cakes, Sweet Desserts / Pastries, Ice Cream, Soda (diet and regular), Sweet Tea, Candies, Chips, Cookies, Sweet Pastries, Store Bought Juices, Alcohol in Excess of 1-2 drinks a day, Artificial Sweeteners, Coffee Creamer, and "Sugar-free" Products. This will help patient to have stable blood glucose profile and potentially avoid unintended weight gain.   - I encouraged the patient to switch to unprocessed or minimally processed complex starch and increased protein intake (animal or plant source), fruits, and vegetables.   - Patient is advised to stick to a routine mealtimes to eat 3 meals a day and avoid unnecessary snacks (to snack only to correct hypoglycemia).  - I have approached him with the following individualized plan to manage his diabetes and patient agrees:   He has made significant progress with  lifestyle modifications.  Will trial Mounjaro to help him on his journey.  Start Mounjaro 2.5 mg SQ weekly x 1 month, then increase to 5 mg SQ weekly. He could benefit from GIP therapy to prevent CV complications as he already has diabetes, HTN, and HLD.  -he is encouraged to continue monitoring glucose at least once daily, before breakfast, and call the clinic if he has readings less than 70 or above 200 for 3 tests in row.  - Adjustment parameters are given to him for hypo and hyperglycemia in writing.  - he is not a candidate for Metformin due to concurrent renal insufficiency.  - Specific targets for  A1c; LDL, HDL, and Triglycerides were discussed with the patient.  2) Blood Pressure /Hypertension:  his blood pressure is controlled to target.   he is advised to continue his current medications including Valsartan 320 mg po daily, HCTZ 25 mg po daily, Norvasc 5 mg po daily, and Metoprolol 75 mg po daily.    3) Lipids/Hyperlipidemia:    Review of his recent lipid panel from 10/04/22 showed uncontrolled LDL at 128 and slightly elevated triglycerides of 175.  He is currently not on any lipid lowering medications.   4)  Weight/Diet:  his Body mass index is 49.44 kg/m.  -  clearly complicating his diabetes care.   he is a candidate for weight loss. I discussed with him the fact that loss of 5 - 10% of his  current body weight will have the most impact on his diabetes management.  Exercise, and  detailed carbohydrates information provided  -  detailed on discharge instructions.  5) Chronic Care/Health Maintenance: -he is on ACEI/ARB and is not on Statin medications and is encouraged to initiate and continue to follow up with Ophthalmology, Dentist, Podiatrist at least yearly or according to recommendations, and advised to stay away from smoking. I have recommended yearly flu vaccine and pneumonia vaccine at least every 5 years; moderate intensity exercise for up to 150 minutes weekly; and sleep for  at least 7 hours a day.  - he is advised to maintain close follow up with Creola Corn, MD for primary care needs, as well as his other providers for optimal and coordinated care.  -I did encourage him to wear proper fitting shoes with adequate support to help reduce foot discomfort.    I spent  46  minutes in the care of the patient today including review of labs from CMP, Lipids, Thyroid Function, Hematology (current and previous including abstractions from other facilities); face-to-face time discussing  his blood glucose readings/logs, discussing hypoglycemia and hyperglycemia episodes and symptoms, medications doses, his options of short and long term treatment based on the latest standards of care / guidelines;  discussion about incorporating lifestyle medicine;  and documenting the encounter. Risk reduction counseling performed per USPSTF guidelines to reduce obesity and cardiovascular risk factors.     Please refer to Patient Instructions for Blood Glucose Monitoring and Insulin/Medications Dosing Guide"  in media tab for additional information. Please  also refer to " Patient Self Inventory" in the Media  tab for reviewed elements of pertinent patient history.  Henry Dougherty participated in the discussions, expressed understanding, and voiced agreement with the above plans.  All questions were answered to his satisfaction. he is encouraged to contact clinic should he have any questions or concerns prior to his return visit.     Follow up plan: - Return in about 3 months (around 07/21/2023) for Diabetes F/U with A1c in office, No previsit labs, Bring meter and logs.   Ronny Bacon, [facility] [address] [place], [place] [address] Phone: [phone] Fax: [phone]  04/20/2023, 8:42 AM

## 2023-04-20 NOTE — Patient Instructions (Signed)

## 2023-04-21 ENCOUNTER — Other Ambulatory Visit (HOSPITAL_COMMUNITY): Payer: Self-pay

## 2023-04-21 ENCOUNTER — Telehealth: Payer: Self-pay

## 2023-04-21 NOTE — Telephone Encounter (Signed)
Patient Advocate Encounter   Received notification from [facility] that prior authorization is required for [facility] 2.5MG /0.5ML pen-injectors  Submitted: 04/21/23 Key [id number]  Status is pending

## 2023-04-26 NOTE — Telephone Encounter (Signed)
Pharmacy Patient Advocate Encounter  Prior Authorization has been APPROVED  Effective through 04/20/2024

## 2023-04-26 NOTE — Telephone Encounter (Signed)
Patient was called and made aware. 

## 2023-05-01 ENCOUNTER — Other Ambulatory Visit (HOSPITAL_COMMUNITY): Payer: Self-pay

## 2023-05-04 ENCOUNTER — Other Ambulatory Visit: Payer: Self-pay | Admitting: Cardiology

## 2023-05-11 ENCOUNTER — Other Ambulatory Visit (HOSPITAL_COMMUNITY): Payer: Self-pay

## 2023-05-11 NOTE — Telephone Encounter (Signed)
Pt said that he has been given the 5mg  for [facility] and in 2 weeks will start that. He said that he does need a PA on this.

## 2023-06-05 ENCOUNTER — Other Ambulatory Visit: Payer: Self-pay | Admitting: Cardiology

## 2023-06-05 DIAGNOSIS — I4892 Unspecified atrial flutter: Secondary | ICD-10-CM

## 2023-06-06 NOTE — Telephone Encounter (Signed)
Prescription refill request for Eliquis received. Indication:aflutter Last office visit:5/24 Scr:1.53  4/24 Age: 62 Weight:151.9  kg  Prescription refilled

## 2023-07-27 ENCOUNTER — Encounter: Payer: Self-pay | Admitting: Nurse Practitioner

## 2023-07-27 ENCOUNTER — Ambulatory Visit (INDEPENDENT_AMBULATORY_CARE_PROVIDER_SITE_OTHER): Payer: BLUE CROSS/BLUE SHIELD | Admitting: Nurse Practitioner

## 2023-07-27 VITALS — BP 160/88 | HR 80 | Ht 69.0 in | Wt 329.6 lb

## 2023-07-27 DIAGNOSIS — E119 Type 2 diabetes mellitus without complications: Secondary | ICD-10-CM

## 2023-07-27 DIAGNOSIS — Z7985 Long-term (current) use of injectable non-insulin antidiabetic drugs: Secondary | ICD-10-CM

## 2023-07-27 DIAGNOSIS — E782 Mixed hyperlipidemia: Secondary | ICD-10-CM

## 2023-07-27 LAB — POCT GLYCOSYLATED HEMOGLOBIN (HGB A1C): Hemoglobin A1C: 6.1 % — AB (ref 4.0–5.6)

## 2023-07-27 MED ORDER — TIRZEPATIDE 7.5 MG/0.5ML ~~LOC~~ SOAJ: 7.5000 mg | SUBCUTANEOUS | 1 refills | Status: DC

## 2023-07-27 NOTE — Progress Notes (Signed)
Endocrinology Follow Up Note       07/27/2023, 9:59 AM   Subjective:    Patient ID: Henry Dougherty, male    DOB: 1961/02/18.  Henry Dougherty is being seen in follow up after being seen for management of diabetes requested by  Creola Corn, MD.   Past Medical History:  Diagnosis Date   Allergic rhinitis    Atrial fibrillation with RVR (HCC) 03/2020   CHF (congestive heart failure) (HCC)    EF 45%/ VALVULAR CM   DOE (dyspnea on exertion)    Dyspepsia    Environmental allergies    Hypertension    OA (osteoarthritis)    Obese    Preglaucoma    SOB (shortness of breath)    Uveitis    AS A TEENAGER    Past Surgical History:  Procedure Laterality Date   CARDIAC SURGERY     CARDIOVERSION N/A 04/01/2020   Procedure: CARDIOVERSION;  Surgeon: Sande Rives, MD;  Location: [facility] ENDOSCOPY;  Service: Cardiovascular;  Laterality: N/A;   COLONOSCOPY     PT WAS 47 IN [place]- 2-3 TINY POLYPS   COLONOSCOPY WITH PROPOFOL N/A 04/21/2022   Procedure: COLONOSCOPY WITH PROPOFOL;  Surgeon: Jenel Lucks, MD;  Location: [facility] ENDOSCOPY;  Service: Gastroenterology;  Laterality: N/A;   KNEE ARTHROSCOPY Right 2005   MITRAL VALVE REPAIR     POLYPECTOMY  04/21/2022   Procedure: POLYPECTOMY;  Surgeon: Jenel Lucks, MD;  Location: [facility] ENDOSCOPY;  Service: Gastroenterology;;   SHOULDER ARTHROSCOPY Right 2005   TEE WITHOUT CARDIOVERSION N/A 04/01/2016   Procedure: TRANSESOPHAGEAL ECHOCARDIOGRAM (TEE);  Surgeon: Jake Bathe, MD;  Location: [facility] ENDOSCOPY;  Service: Cardiovascular;  Laterality: N/A;   TEE WITHOUT CARDIOVERSION N/A 04/01/2020   Procedure: TRANSESOPHAGEAL ECHOCARDIOGRAM (TEE);  Surgeon: Sande Rives, MD;  Location: [facility] ENDOSCOPY;  Service: Cardiovascular;  Laterality: N/A;    Social History   Socioeconomic History   Marital status: Married    Spouse name: Not on file   Number of children: 3   Years of  education: Not on file   Highest education level: Not on file  Occupational History   Occupation: VP FOR [employer]  Tobacco Use   Smoking status: Never   Smokeless tobacco: Never  Vaping Use   Vaping status: Never Used  Substance and Sexual Activity   Alcohol use: Yes   Drug use: No   Sexual activity: Not on file  Other Topics Concern   Not on file  Social History Narrative   Not on file   Social Determinants of Health   Financial Resource Strain: Not on file  Food Insecurity: Not on file  Transportation Needs: Not on file  Physical Activity: Not on file  Stress: Not on file  Social Connections: Not on file    Family History  Problem Relation Age of Onset   Heart Problems Mother    Atrial fibrillation Mother    Colon cancer Father    Heart Problems Father    Heart attack Maternal Grandfather        died from heart attack in his 37's   Heart attack Paternal Grandfather    Stroke Paternal  Grandfather    Pancreatic cancer Neg Hx    Esophageal cancer Neg Hx    Liver cancer Neg Hx    Stomach cancer Neg Hx     Outpatient Encounter Medications as of 07/27/2023  Medication Sig   Accu-Chek Softclix Lancets lancets Use as instructed to monitor glucose twice daily   acetaminophen (TYLENOL) 500 MG tablet Take 1,000 mg by mouth every 6 (six) hours as needed for moderate pain or mild pain.   amLODipine (NORVASC) 5 MG tablet Take 1 tablet (5 mg total) by mouth daily.   amoxicillin (AMOXIL) 500 MG capsule TAKE FOUR CAPSULES BY MOUTH ONE HOUR BEFORE DENTAL APPOINTMENT   colchicine 0.6 MG tablet Take 0.6 mg by mouth daily as needed (gout).   ELIQUIS 5 MG TABS tablet TAKE 1 TABLET BY MOUTH TWICE A DAY   glucose blood (ACCU-CHEK GUIDE) test strip Use as instructed to monitor glucose twice daily   hydrochlorothiazide (HYDRODIURIL) 25 MG tablet Take 1 tablet (25 mg total) by mouth daily.   metoprolol succinate (TOPROL-XL) 50 MG 24 hr tablet TAKE 1 AND 1/2 TABLET BY MOUTH ONCE  DAILY IMMEDIATELY FOLLOWING A MEAL   tirzepatide (MOUNJARO) 7.5 MG/0.5ML Pen Inject 7.5 mg into the skin once a week.   valsartan (DIOVAN) 320 MG tablet Take 0.5 tablets (160 mg total) by mouth daily.   [DISCONTINUED] tirzepatide [facility]) 5 MG/0.5ML Pen Inject 5 mg into the skin once a week.   Blood Glucose Monitoring Suppl (ACCU-CHEK GUIDE ME) w/Device KIT Use to monitor glucose twice daily   [DISCONTINUED] tirzepatide (MOUNJARO) 2.5 MG/0.5ML Pen Inject 2.5 mg into the skin once a week.   No facility-administered encounter medications on file as of 07/27/2023.    ALLERGIES: Allergies  Allergen Reactions   Amiodarone Other (See Comments)    Blisters and whelps to IV FORM.   Cortisone     Glaucoma HX ,  Triggers increased pressure in eye    VACCINATION STATUS:  There is no immunization history on file for this patient.  Diabetes He presents for his follow-up diabetic visit. Diabetes type: prediabetes. Onset time: prediabetes since age 56. His disease course has been stable. There are no hypoglycemic associated symptoms. Associated symptoms include foot paresthesias. Pertinent negatives for diabetes include no weight loss (slow). Symptoms are stable. Diabetic complications include heart disease (CHF- s/p mitral valve repair) and nephropathy. Risk factors for coronary artery disease include dyslipidemia, male sex, obesity, hypertension and sedentary lifestyle. Current diabetic treatment includes diet. He is compliant with treatment most of the time. His weight is decreasing steadily. He is following a generally healthy and diabetic diet. Meal planning includes avoidance of concentrated sweets. He has not had a previous visit with a dietitian. He rarely participates in exercise. His breakfast blood glucose range is generally 110-130 mg/dl. (He presents today, accompanied by his wife, with his logs showing stable, slightly above target fasting glycemic profile.  His POCT A1c today is 6.1%,  essentially unchanged from previous visit.  He notes he has just recently started exercising (got exercise bike at home) and continues to work on [employer].  He denies any significant hypoglycemia, notes his glucose dropped into the 70's while working in the yard once.  He notes some nausea the day of his Mounjaro injection but it is tolerable.) An ACE inhibitor/angiotensin II receptor blocker is being taken. He does not see a podiatrist.Eye exam is not current.     Review of systems  Constitutional: + stable body weight, current  Body mass index is 48.67 kg/m., no fatigue, no subjective hyperthermia, no subjective hypothermia Eyes: no blurry vision, no xerophthalmia ENT: no sore throat, no nodules palpated in throat, no dysphagia/odynophagia, no hoarseness Cardiovascular: no chest pain, no shortness of breath, no palpitations, no leg swelling Respiratory: no cough, no shortness of breath Gastrointestinal: no nausea/vomiting/diarrhea Musculoskeletal: no muscle/joint aches Skin: no rashes, no hyperemia Neurological: no tremors, + numbness/tingling/nerve pain to bilateral feet- worse at night (somewhat improved with OTC Magnesium supplement), no dizziness Psychiatric: no depression, no anxiety  Objective:     BP (!) 160/88 (BP Location: Right Arm, Patient Position: Sitting, Cuff Size: Large) Comment: Retake Manuel Cuff  Pulse 80   Ht 5\' 9"  (1.753 m)   Wt (!) 329 lb 9.6 oz (149.5 kg)   BMI 48.67 kg/m   Wt Readings from Last 3 Encounters:  07/27/23 (!) 329 lb 9.6 oz (149.5 kg)  04/20/23 (!) 334 lb 12.8 oz (151.9 kg)  01/18/23 (!) 334 lb (151.5 kg)     BP Readings from Last 3 Encounters:  07/27/23 (!) 160/88  04/20/23 124/77  04/03/23 120/70      Physical Exam- Limited  Constitutional:  Body mass index is 48.67 kg/m. , not in acute distress, normal state of mind Eyes:  EOMI, no exophthalmos Musculoskeletal: no gross deformities, strength intact in all four extremities, no  gross restriction of joint movements Skin:  no rashes, no hyperemia Neurological: no tremor with outstretched hands   Diabetic Foot Exam - Simple   No data filed     CMP ( most recent) CMP     Component Value Date/Time   NA 139 03/10/2023 0950   K 5.2 03/10/2023 0950   CL 98 03/10/2023 0950   CO2 25 03/10/2023 0950   GLUCOSE 208 (H) 03/10/2023 0950   GLUCOSE 184 (H) 01/22/2021 0931   BUN 26 03/10/2023 0950   CREATININE 1.53 (H) 03/10/2023 0950   CREATININE 2.19 (H) 04/13/2016 1048   CALCIUM 10.4 (H) 03/10/2023 0950   PROT 6.3 (L) 01/22/2021 0931   ALBUMIN 3.7 01/22/2021 0931   AST 21 01/22/2021 0931   ALT 37 01/22/2021 0931   ALKPHOS 41 01/22/2021 0931   BILITOT 1.0 01/22/2021 0931   GFRNONAA 49 (L) 01/22/2021 0931   GFRAA 71 05/07/2020 1156     Diabetic Labs (most recent): Lab Results  Component Value Date   HGBA1C 6.1 (A) 07/27/2023   HGBA1C 6.2 (A) 04/20/2023   HGBA1C 6.7 (A) 01/18/2023     Lipid Panel ( most recent) Lipid Panel     Component Value Date/Time   TRIG 175 (A) 10/04/2022 0000   LDLCALC 128 10/04/2022 0000      Lab Results  Component Value Date   TSH 1.459 03/30/2020   TSH 2.459 03/31/2016           Assessment & Plan:   1) Type 2 Diabetes mellitus without complication, without long-term current use of insulin  He presents today, accompanied by his wife, with his logs showing stable, slightly above target fasting glycemic profile.  His POCT A1c today is 6.1%, essentially unchanged from previous visit.  He notes he has just recently started exercising (got exercise bike at home) and continues to work on [employer].  He denies any significant hypoglycemia, notes his glucose dropped into the 70's while working in the yard once.  He notes some nausea the day of his Mounjaro injection but it is tolerable.  - Henry Dougherty has diabetes since  62 years of age.   -Recent labs reviewed.  - I had a long discussion with him about the  progressive nature of diabetes and the pathology behind its complications. -his prediabetes is complicated by CHF, CKD stage 3a and he remains at a high risk for more acute and chronic complications which include CAD, CVA, CKD, retinopathy, and neuropathy. These are all discussed in detail with him.  The following Lifestyle Medicine recommendations according to American College of Lifestyle Medicine [facility]) were discussed and offered to patient and he agrees to start the journey:  A. Whole Foods, Plant-based plate comprising of fruits and vegetables, plant-based proteins, whole-grain carbohydrates was discussed in detail with the patient.   A list for source of those nutrients were also provided to the patient.  Patient will use only water or unsweetened tea for hydration. B.  The need to stay away from risky substances including alcohol, smoking; obtaining 7 to 9 hours of restorative sleep, at least 150 minutes of moderate intensity exercise weekly, the importance of healthy social connections,  and stress reduction techniques were discussed. C.  A full color page of  Calorie density of various food groups per pound showing examples of each food groups was provided to the patient.  - Nutritional counseling repeated at each appointment due to patients tendency to fall back in to old habits.  - The patient admits there is a room for improvement in their diet and drink choices. -  Suggestion is made for the patient to avoid simple carbohydrates from their diet including Cakes, Sweet Desserts / Pastries, Ice Cream, Soda (diet and regular), Sweet Tea, Candies, Chips, Cookies, Sweet Pastries, Store Bought Juices, Alcohol in Excess of 1-2 drinks a day, Artificial Sweeteners, Coffee Creamer, and "Sugar-free" Products. This will help patient to have stable blood glucose profile and potentially avoid unintended weight gain.   - I encouraged the patient to switch to unprocessed or minimally processed complex  starch and increased protein intake (animal or plant source), fruits, and vegetables.   - Patient is advised to stick to a routine mealtimes to eat 3 meals a day and avoid unnecessary snacks (to snack only to correct hypoglycemia).  - I have approached him with the following individualized plan to manage his diabetes and patient agrees:   He is advised to increase his Mounjaro to 7.5 mg SQ weekly.  He is aware of the potential side effects with the dosage increase and advised to reach out if they are too much to bear.  -he is encouraged to continue monitoring glucose at least once daily, before breakfast, and call the clinic if he has readings less than 70 or above 200 for 3 tests in row.  - Adjustment parameters are given to him for hypo and hyperglycemia in writing.  - he is not a candidate for Metformin due to concurrent renal insufficiency.  - Specific targets for  A1c; LDL, HDL, and Triglycerides were discussed with the patient.  2) Blood Pressure /Hypertension:  his blood pressure is not controlled to target today.   he is advised to continue his current medications including Valsartan 320 mg po daily, HCTZ 25 mg po daily, Norvasc 5 mg po daily, and Metoprolol 75 mg po daily.    3) Lipids/Hyperlipidemia:    Review of his recent lipid panel from 10/04/22 showed uncontrolled LDL at 128 and slightly elevated triglycerides of 175.  He is currently not on any lipid lowering medications.  He has appt with PCP coming up,  requested he bring copy of any labs they do for our record keeping here as well.  4)  Weight/Diet:  his Body mass index is 48.67 kg/m.  -  clearly complicating his diabetes care.   he is a candidate for weight loss. I discussed with him the fact that loss of 5 - 10% of his  current body weight will have the most impact on his diabetes management.  Exercise, and detailed carbohydrates information provided  -  detailed on discharge instructions.  5) Chronic Care/Health  Maintenance: -he is on ACEI/ARB and is not on Statin medications and is encouraged to initiate and continue to follow up with Ophthalmology, Dentist, Podiatrist at least yearly or according to recommendations, and advised to stay away from smoking. I have recommended yearly flu vaccine and pneumonia vaccine at least every 5 years; moderate intensity exercise for up to 150 minutes weekly; and sleep for at least 7 hours a day.  - he is advised to maintain close follow up with Creola Corn, MD for primary care needs, as well as his other providers for optimal and coordinated care.  -I did encourage him to wear proper fitting shoes with adequate support to help reduce foot discomfort.      I spent  36  minutes in the care of the patient today including review of labs from CMP, Lipids, Thyroid Function, Hematology (current and previous including abstractions from other facilities); face-to-face time discussing  his blood glucose readings/logs, discussing hypoglycemia and hyperglycemia episodes and symptoms, medications doses, his options of short and long term treatment based on the latest standards of care / guidelines;  discussion about incorporating lifestyle medicine;  and documenting the encounter. Risk reduction counseling performed per USPSTF guidelines to reduce obesity and cardiovascular risk factors.     Please refer to Patient Instructions for Blood Glucose Monitoring and Insulin/Medications Dosing Guide"  in media tab for additional information. Please  also refer to " Patient Self Inventory" in the Media  tab for reviewed elements of pertinent patient history.  Henry Dougherty participated in the discussions, expressed understanding, and voiced agreement with the above plans.  All questions were answered to his satisfaction. he is encouraged to contact clinic should he have any questions or concerns prior to his return visit.     Follow up plan: - Return in about 4 months (around  11/26/2023) for Diabetes F/U with A1c in office, No previsit labs.   Ronny Bacon, [facility] [address] [place], [place] [address] Phone: [phone] Fax: [phone]  07/27/2023, 9:59 AM

## 2023-07-27 NOTE — Patient Instructions (Signed)
 High-Fiber Eating Plan Fiber, also called dietary fiber, is found in foods such as fruits, vegetables, whole grains, and beans. A high-fiber diet can be good for your health. Your health care provider may recommend a high-fiber diet to help: Prevent trouble pooping (constipation). Lower your cholesterol. Treat the following conditions: Hemorrhoids. This is inflammation of veins in the anus. Inflammation of specific areas of the digestive tract. Irritable bowel syndrome (IBS). This is a problem of the large intestine, also called the colon, that sometimes causes belly pain and bloating. Prevent overeating as part of a weight-loss plan. Lower the risk of heart disease, type 2 diabetes, and certain cancers. What are tips for following this plan? Reading food labels  Check the nutrition facts label on foods for the amount of dietary fiber. Choose foods that have 4 grams of fiber or more per serving. The recommended goals for how much fiber you should eat each day include: Males 40 years old or younger: 30-34 g. Males over 37 years old: 28-34 g. Females 64 years old or younger: 25-28 g. Females over 69 years old: 22-25 g. Your daily fiber goal is _____________ g. Shopping Choose whole fruits and vegetables instead of processed. For example, choose apples instead of apple juice or applesauce. Choose a variety of high-fiber foods such as avocados, lentils, oats, and pinto beans. Read the nutrition facts label on foods. Check for foods with added fiber. These foods often have high sugar and salt (sodium) amounts per serving. Cooking Use whole-grain flour for baking and cooking. Cook with brown rice instead of white rice. Make meals that have a lot of beans and vegetables in them, such as chili or vegetable-based soups. Meal planning Start the day with a breakfast that is high in fiber, such as a cereal that has 5 g of fiber or more per serving. Eat breads and cereals that are made with  whole-grain flour instead of refined flour or white flour. Eat brown rice, bulgur wheat, or millet instead of white rice. Use beans in place of meat in soups, salads, and pasta dishes. Be sure that half of the grains you eat each day are whole grains. General information You can get the recommended amount of dietary fiber by: Eating a variety of fruits, vegetables, grains, nuts, and beans. Taking a fiber supplement if you aren't able to eat enough fiber. It's better to get fiber through food than from a supplement. Slowly increase how much fiber you eat. If you increase the amount of fiber you eat too quickly, you may have bloating, cramping, or gas. Drink plenty of water to help you digest fiber. Choose high-fiber snacks, such as berries, raw vegetables, nuts, and popcorn. What foods should I eat? Fruits Berries. Pears. Apples. Oranges. Avocado. Prunes and raisins. Dried figs. Vegetables Sweet potatoes. Spinach. Kale. Artichokes. Cabbage. Broccoli. Cauliflower. Green peas. Carrots. Squash. Grains Whole-grain breads. Multigrain cereal. Oats and oatmeal. Brown rice. Barley. Bulgur wheat. Millet. Quinoa. Bran muffins. Popcorn. Rye wafer crackers. Meats and other proteins Navy beans, kidney beans, and pinto beans. Soybeans. Split peas. Lentils. Nuts and seeds. Dairy Fiber-fortified yogurt. Fortified means that fiber has been added to the product. Beverages Fiber-fortified soy milk. Fiber-fortified orange juice. Other foods Fiber bars. The items listed above may not be all the foods and drinks you can have. Talk to a dietitian to learn more. What foods should I avoid? Fruits Fruit juice. Cooked, strained fruit. Vegetables Fried potatoes. Canned vegetables. Well-cooked vegetables. Grains White bread. Pasta made with refined  flour. White rice. Meats and other proteins Fatty meat. Fried chicken or fried fish. Dairy Milk. Cream cheese. Sour cream. Fats and  oils Butters. Beverages Soft drinks. Other foods Cakes and pastries. The items listed above may not be all the foods and drinks you should avoid. Talk to a dietitian to learn more. This information is not intended to replace advice given to you by your health care provider. Make sure you discuss any questions you have with your health care provider. Document Revised: 01/23/2023 Document Reviewed: 01/23/2023 Elsevier Patient Education  2024 [employer].

## 2023-09-27 ENCOUNTER — Other Ambulatory Visit: Payer: Self-pay | Admitting: Cardiology

## 2023-10-10 LAB — LIPID PANEL
LDL Cholesterol: 134
Triglycerides: 259 — AB (ref 40–160)

## 2023-10-10 LAB — COMPREHENSIVE METABOLIC PANEL: eGFR: 57

## 2023-10-10 LAB — HEMOGLOBIN A1C: Hemoglobin A1C: 6.1

## 2023-10-10 LAB — BASIC METABOLIC PANEL
BUN: 28 — AB (ref 4–21)
Creatinine: 1.5 — AB (ref 0.6–1.3)

## 2023-10-10 LAB — TSH: TSH: 1.57 (ref 0.41–5.90)

## 2023-10-11 ENCOUNTER — Encounter: Payer: Self-pay | Admitting: Nurse Practitioner

## 2023-10-23 ENCOUNTER — Encounter: Payer: Self-pay | Admitting: Nurse Practitioner

## 2023-10-23 MED ORDER — TIRZEPATIDE 10 MG/0.5ML ~~LOC~~ SOAJ: 10.0000 mg | SUBCUTANEOUS | 1 refills | Status: DC

## 2023-11-03 ENCOUNTER — Other Ambulatory Visit: Payer: Self-pay | Admitting: Cardiology

## 2023-11-03 DIAGNOSIS — I4892 Unspecified atrial flutter: Secondary | ICD-10-CM

## 2023-11-03 NOTE — Telephone Encounter (Signed)
Eliquis 5mg  refill request received. Patient is 62 years old, weight-149.5kg, Crea-1.53 on 03/10/23, Diagnosis-Aflutter, and last seen by Dr. Anne Fu on 11/29/22. Dose is appropriate based on dosing criteria. Will send in refill to requested pharmacy.

## 2023-11-10 ENCOUNTER — Encounter: Payer: Self-pay | Admitting: Podiatry

## 2023-11-10 ENCOUNTER — Ambulatory Visit (INDEPENDENT_AMBULATORY_CARE_PROVIDER_SITE_OTHER): Payer: BLUE CROSS/BLUE SHIELD | Admitting: Podiatry

## 2023-11-10 ENCOUNTER — Ambulatory Visit (INDEPENDENT_AMBULATORY_CARE_PROVIDER_SITE_OTHER): Payer: BLUE CROSS/BLUE SHIELD

## 2023-11-10 DIAGNOSIS — M79674 Pain in right toe(s): Secondary | ICD-10-CM

## 2023-11-10 DIAGNOSIS — M79675 Pain in left toe(s): Secondary | ICD-10-CM | POA: Diagnosis not present

## 2023-11-10 DIAGNOSIS — E1142 Type 2 diabetes mellitus with diabetic polyneuropathy: Secondary | ICD-10-CM

## 2023-11-10 DIAGNOSIS — E1140 Type 2 diabetes mellitus with diabetic neuropathy, unspecified: Secondary | ICD-10-CM

## 2023-11-10 DIAGNOSIS — M778 Other enthesopathies, not elsewhere classified: Secondary | ICD-10-CM

## 2023-11-10 DIAGNOSIS — B351 Tinea unguium: Secondary | ICD-10-CM

## 2023-11-10 NOTE — Progress Notes (Unsigned)
°  Subjective:  Patient ID: Henry Dougherty, male    DOB: Jun 05, 1961,  MRN: [id number]  Chief Complaint  Patient presents with   Difficulty Walking    He reports that he was talking to his dad and uncle who have neuropathy and wants to get it check out. He is pre-diabetic, His last A1c was 6.1. He is not taking anything for nerve pain. He is on blood thinners. Has increased pain with sitting long times or walking.     62 y.o. male presents with the above complaint. History confirmed with patient. Patient's primary complaint worsening numbness and tingling sensations to his feet. Patient also presenting with pain related to dystrophic thickened elongated nails. Patient is unable to trim own nails related to nail dystrophy and/or mobility issues. Patient endorses history of pre diabetes. States that he is on blood thinners. Of note, does report history of gout.  Objective:  Physical Exam: warm, good capillary refill nail exam onychomycosis of the toenails with thickening, discolaration, subungal debris. Left great toenail most notable. DP pulses palpable, PT pulses palpable, protective sensation diminished 7/10 via SWMF, and vibratory sensation absent Left Foot:  Pain with palpation of nails due to elongation and dystrophic growth.  Right Foot: Pain with palpation of nails due to elongation and dystrophic growth.   Bilateral radiographs were obtained: Joint spaces well-preserved.  No acute fracture or acute osseous abnormality appreciated.  No significant Furth abnormality.  Previous left bunion surgery.  Assessment:   1. Capsulitis of foot   2. Pain due to onychomycosis of toenails of both feet   3. Diabetic polyneuropathy associated with type 2 diabetes mellitus (HCC)      Plan:  Patient was evaluated and treated and all questions answered.  #Onychomycosis with pain  -Nails palliatively debrided as below. -Educated on self-care -Left hallux nail specimen collected to be sent off for  pathological analysis.  Procedure: Nail Debridement Rationale: Pain Type of Debridement: manual, sharp debridement. Instrumentation: Nail nipper, rotary burr. Number of Nails: 10  #Pre-Diabetes with neuropathy -Patient states he is prediabetic but has had worsening neuropathic pain over the past year - Discussed other causes of nerve pain in the lower extremities including back pain, patient does endorse history of this, states he may have this evaluated - Discussed importance of maintaining good glucose control to prevent progression of this condition. Did discuss that oral medication typically helps with symptom management but is non-curative. He would like to defer this for now. May want to consider Qutenza in the future. -Patient educated on diabetes. Discussed proper diabetic foot care and discussed risks and complications of disease. Educated patient in depth on reasons to return to the office immediately should he/she discover anything concerning or new on the feet. All questions answered. Discussed proper shoes as well.    Return in about 4 weeks (around 12/08/2023) for Nail Pathology.         Bronwen Betters, DPM [facility] / [facility]

## 2023-11-12 ENCOUNTER — Encounter: Payer: Self-pay | Admitting: Podiatry

## 2023-11-17 ENCOUNTER — Other Ambulatory Visit: Payer: Self-pay | Admitting: Podiatry

## 2023-12-04 ENCOUNTER — Encounter: Payer: Self-pay | Admitting: Nurse Practitioner

## 2023-12-04 ENCOUNTER — Ambulatory Visit: Payer: BLUE CROSS/BLUE SHIELD | Admitting: Nurse Practitioner

## 2023-12-04 VITALS — BP 144/86 | HR 75 | Ht 69.0 in | Wt 325.0 lb

## 2023-12-04 DIAGNOSIS — E782 Mixed hyperlipidemia: Secondary | ICD-10-CM

## 2023-12-04 DIAGNOSIS — E119 Type 2 diabetes mellitus without complications: Secondary | ICD-10-CM | POA: Diagnosis not present

## 2023-12-04 DIAGNOSIS — Z7985 Long-term (current) use of injectable non-insulin antidiabetic drugs: Secondary | ICD-10-CM

## 2023-12-04 NOTE — Progress Notes (Signed)
Endocrinology Follow Up Note       12/04/2023, 8:32 AM   Subjective:    Patient ID: Henry Dougherty, male    DOB: [birth year].  Henry Dougherty is being seen in follow up after being seen for management of diabetes requested by  Creola Corn, MD.   Past Medical History:  Diagnosis Date   Allergic rhinitis    Atrial fibrillation with RVR (HCC) 03/2020   CHF (congestive heart failure) (HCC)    EF 45%/ VALVULAR CM   DOE (dyspnea on exertion)    Dyspepsia    Environmental allergies    Hypertension    OA (osteoarthritis)    Obese    Preglaucoma    SOB (shortness of breath)    Uveitis    AS A TEENAGER    Past Surgical History:  Procedure Laterality Date   CARDIAC SURGERY     CARDIOVERSION N/A 04/01/2020   Procedure: CARDIOVERSION;  Surgeon: Sande Rives, MD;  Location: [facility] ENDOSCOPY;  Service: Cardiovascular;  Laterality: N/A;   COLONOSCOPY     PT WAS 47 IN [place]- 2-3 TINY POLYPS   COLONOSCOPY WITH PROPOFOL N/A 04/21/2022   Procedure: COLONOSCOPY WITH PROPOFOL;  Surgeon: Jenel Lucks, MD;  Location: [facility] ENDOSCOPY;  Service: Gastroenterology;  Laterality: N/A;   KNEE ARTHROSCOPY Right 2005   MITRAL VALVE REPAIR     POLYPECTOMY  04/21/2022   Procedure: POLYPECTOMY;  Surgeon: Jenel Lucks, MD;  Location: [facility] ENDOSCOPY;  Service: Gastroenterology;;   SHOULDER ARTHROSCOPY Right 2005   TEE WITHOUT CARDIOVERSION N/A 04/01/2016   Procedure: TRANSESOPHAGEAL ECHOCARDIOGRAM (TEE);  Surgeon: Jake Bathe, MD;  Location: [facility] ENDOSCOPY;  Service: Cardiovascular;  Laterality: N/A;   TEE WITHOUT CARDIOVERSION N/A 04/01/2020   Procedure: TRANSESOPHAGEAL ECHOCARDIOGRAM (TEE);  Surgeon: Sande Rives, MD;  Location: [facility] ENDOSCOPY;  Service: Cardiovascular;  Laterality: N/A;    Social History   Socioeconomic History   Marital status: Married    Spouse name: Not on file   Number of children: 3   Years of  education: Not on file   Highest education level: Not on file  Occupational History   Occupation: VP FOR [employer]  Tobacco Use   Smoking status: Never   Smokeless tobacco: Never  Vaping Use   Vaping status: Never Used  Substance and Sexual Activity   Alcohol use: Yes   Drug use: No   Sexual activity: Not on file  Other Topics Concern   Not on file  Social History Narrative   Not on file   Social Drivers of Health   Financial Resource Strain: Not on file  Food Insecurity: Not on file  Transportation Needs: Not on file  Physical Activity: Not on file  Stress: Not on file  Social Connections: Not on file    Family History  Problem Relation Age of Onset   Heart Problems Mother    Atrial fibrillation Mother    Colon cancer Father    Heart Problems Father    Heart attack Maternal Grandfather        died from heart attack in his 75's   Heart attack Paternal Grandfather    Stroke Paternal  Grandfather    Pancreatic cancer Neg Hx    Esophageal cancer Neg Hx    Liver cancer Neg Hx    Stomach cancer Neg Hx     Outpatient Encounter Medications as of 12/04/2023  Medication Sig   Accu-Chek Softclix Lancets lancets Use as instructed to monitor glucose twice daily   acetaminophen (TYLENOL) 500 MG tablet Take 1,000 mg by mouth every 6 (six) hours as needed for moderate pain or mild pain.   amLODipine (NORVASC) 5 MG tablet Take 1 tablet (5 mg total) by mouth daily.   amoxicillin (AMOXIL) 500 MG capsule TAKE FOUR CAPSULES BY MOUTH ONE HOUR BEFORE DENTAL APPOINTMENT   apixaban (ELIQUIS) 5 MG TABS tablet TAKE 1 TABLET BY MOUTH TWICE A DAY   colchicine 0.6 MG tablet Take 0.6 mg by mouth daily as needed (gout).   glucose blood (ACCU-CHEK GUIDE) test strip Use as instructed to monitor glucose twice daily   hydrochlorothiazide (HYDRODIURIL) 25 MG tablet TAKE 1 TABLET BY MOUTH EVERY DAY   metoprolol succinate (TOPROL-XL) 50 MG 24 hr tablet TAKE 1 AND 1/2 TABLET BY MOUTH ONCE DAILY  IMMEDIATELY FOLLOWING A MEAL   rosuvastatin (CRESTOR) 10 MG tablet Take 10 mg by mouth daily.   tirzepatide (MOUNJARO) 10 MG/0.5ML Pen Inject 10 mg into the skin once a week.   valsartan (DIOVAN) 320 MG tablet Take 0.5 tablets (160 mg total) by mouth daily.   No facility-administered encounter medications on file as of 12/04/2023.    ALLERGIES: Allergies  Allergen Reactions   Amiodarone Other (See Comments)    Blisters and whelps to IV FORM.   Cortisone     Glaucoma HX ,  Triggers increased pressure in eye    VACCINATION STATUS:  There is no immunization history on file for this patient.  Diabetes He presents for his follow-up diabetic visit. Diabetes type: prediabetes. Onset time: prediabetes since age 59. His disease course has been stable. Hypoglycemia symptoms include dizziness, nervousness/anxiousness, sweats and tremors. (When waits too long between meals) Associated symptoms include foot paresthesias and weight loss (slow). Symptoms are stable. Diabetic complications include heart disease (CHF- s/p mitral valve repair) and nephropathy. Risk factors for coronary artery disease include dyslipidemia, male sex, obesity, hypertension and sedentary lifestyle. Current diabetic treatment includes diet. He is compliant with treatment most of the time. His weight is decreasing steadily. He is following a generally healthy and diabetic diet. Meal planning includes avoidance of concentrated sweets. He has not had a previous visit with a dietitian. He rarely participates in exercise. His breakfast blood glucose range is generally 110-130 mg/dl. (He presents today with his logs showing slightly above target fasting and at goal postprandial readings.  His most recent A1c on 11/26 by his PCP was 6.1%, unchanged from previous visit.  He denies any significant hypoglycemia but he does note that if he waits too long between meals, he does get symptomatic.  He just recently started the 10 mg of Mounjaro, so  far is tolerating it well.) An ACE inhibitor/angiotensin II receptor blocker is being taken. He does not see a podiatrist.Eye exam is not current.   Review of systems  Constitutional: + steadily decreasing body weight,  current Body mass index is 47.99 kg/m. , no fatigue, no subjective hyperthermia, no subjective hypothermia Eyes: no blurry vision, no xerophthalmia ENT: no sore throat, no nodules palpated in throat, no dysphagia/odynophagia, no hoarseness Cardiovascular: no chest pain, no shortness of breath, no palpitations, no leg swelling Respiratory: no cough, no  shortness of breath Gastrointestinal: no nausea/vomiting/diarrhea Musculoskeletal: no muscle/joint aches Skin: no rashes, no hyperemia Neurological: no tremors, no numbness, no tingling, no dizziness Psychiatric: no depression, no anxiety  Objective:     BP (!) 144/86 (BP Location: Left Arm, Patient Position: Sitting, Cuff Size: Large) Comment: Recehck Manuel Cuff - Patient states that he had just taken BP Med. Patient follows Dr. Timothy Lasso. Sanders Manninen made aware. Comment (BP Location): Lower left arm  Pulse 75   Ht 5\' 9"  (1.753 m)   Wt (!) 325 lb (147.4 kg)   BMI 47.99 kg/m   Wt Readings from Last 3 Encounters:  12/04/23 (!) 325 lb (147.4 kg)  07/27/23 (!) 329 lb 9.6 oz (149.5 kg)  04/20/23 (!) 334 lb 12.8 oz (151.9 kg)     BP Readings from Last 3 Encounters:  12/04/23 (!) 144/86  07/27/23 (!) 160/88  04/20/23 124/77      Physical Exam- Limited  Constitutional:  Body mass index is 47.99 kg/m. , not in acute distress, normal state of mind Eyes:  EOMI, no exophthalmos Musculoskeletal: no gross deformities, strength intact in all four extremities, no gross restriction of joint movements Skin:  no rashes, no hyperemia Neurological: no tremor with outstretched hands   Diabetic Foot Exam - Simple   No data filed     CMP ( most recent) CMP     Component Value Date/Time   NA 139 03/10/2023 0950   K 5.2  03/10/2023 0950   CL 98 03/10/2023 0950   CO2 25 03/10/2023 0950   GLUCOSE 208 (H) 03/10/2023 0950   GLUCOSE 184 (H) 01/22/2021 0931   BUN 28 (A) 10/10/2023 0000   CREATININE 1.5 (A) 10/10/2023 0000   CREATININE 1.53 (H) 03/10/2023 0950   CREATININE 2.19 (H) 04/13/2016 1048   CALCIUM 10.4 (H) 03/10/2023 0950   PROT 6.3 (L) 01/22/2021 0931   ALBUMIN 3.7 01/22/2021 0931   AST 21 01/22/2021 0931   ALT 37 01/22/2021 0931   ALKPHOS 41 01/22/2021 0931   BILITOT 1.0 01/22/2021 0931   GFRNONAA 49 (L) 01/22/2021 0931   GFRAA 71 05/07/2020 1156     Diabetic Labs (most recent): Lab Results  Component Value Date   HGBA1C 6.1 10/10/2023   HGBA1C 6.1 (A) 07/27/2023   HGBA1C 6.2 (A) 04/20/2023     Lipid Panel ( most recent) Lipid Panel     Component Value Date/Time   TRIG 259 (A) 10/10/2023 0000   LDLCALC 134 10/10/2023 0000      Lab Results  Component Value Date   TSH 1.57 10/10/2023   TSH 1.459 03/30/2020   TSH 2.459 03/31/2016           Assessment & Plan:   1) Type 2 Diabetes mellitus without complication, without long-term current use of insulin  He presents today with his logs showing slightly above target fasting and at goal postprandial readings.  His most recent A1c on 11/26 by his PCP was 6.1%, unchanged from previous visit.  He denies any significant hypoglycemia but he does note that if he waits too long between meals, he does get symptomatic.  He just recently started the 10 mg of Mounjaro, so far is tolerating it well.  - Henry Dougherty has diabetes since 63 years of age.   -Recent labs reviewed.  - I had a long discussion with him about the progressive nature of diabetes and the pathology behind its complications. -his prediabetes is complicated by CHF, CKD stage 3a and he remains  at a high risk for more acute and chronic complications which include CAD, CVA, CKD, retinopathy, and neuropathy. These are all discussed in detail with him.  The following  Lifestyle Medicine recommendations according to American College of Lifestyle Medicine [facility]) were discussed and offered to patient and he agrees to start the journey:  A. Whole Foods, Plant-based plate comprising of fruits and vegetables, plant-based proteins, whole-grain carbohydrates was discussed in detail with the patient.   A list for source of those nutrients were also provided to the patient.  Patient will use only water or unsweetened tea for hydration. B.  The need to stay away from risky substances including alcohol, smoking; obtaining 7 to 9 hours of restorative sleep, at least 150 minutes of moderate intensity exercise weekly, the importance of healthy social connections,  and stress reduction techniques were discussed. C.  A full color page of  Calorie density of various food groups per pound showing examples of each food groups was provided to the patient.  - Nutritional counseling repeated at each appointment due to patients tendency to fall back in to old habits.  - The patient admits there is a room for improvement in their diet and drink choices. -  Suggestion is made for the patient to avoid simple carbohydrates from their diet including Cakes, Sweet Desserts / Pastries, Ice Cream, Soda (diet and regular), Sweet Tea, Candies, Chips, Cookies, Sweet Pastries, Store Bought Juices, Alcohol in Excess of 1-2 drinks a day, Artificial Sweeteners, Coffee Creamer, and "Sugar-free" Products. This will help patient to have stable blood glucose profile and potentially avoid unintended weight gain.   - I encouraged the patient to switch to unprocessed or minimally processed complex starch and increased protein intake (animal or plant source), fruits, and vegetables.   - Patient is advised to stick to a routine mealtimes to eat 3 meals a day and avoid unnecessary snacks (to snack only to correct hypoglycemia).  - I have approached him with the following individualized plan to manage his diabetes  and patient agrees:   He is advised to continue Mounjaro 10 mg SQ weekly.    -he is encouraged to continue monitoring glucose at least once daily, before breakfast, and call the clinic if he has readings less than 70 or above 200 for 3 tests in row.  - Adjustment parameters are given to him for hypo and hyperglycemia in writing.  - he is not a candidate for Metformin due to concurrent renal insufficiency.  - Specific targets for  A1c; LDL, HDL, and Triglycerides were discussed with the patient.  2) Blood Pressure /Hypertension:  his blood pressure is not controlled to target today but he notes he just took his medication prior to his appt.   he is advised to continue his current medications including Valsartan 320 mg po daily, HCTZ 25 mg po daily, Norvasc 5 mg po daily, and Metoprolol 75 mg po daily.    3) Lipids/Hyperlipidemia:    Review of his recent lipid panel from 10/10/23 showed uncontrolled LDL at 134 and elevated triglycerides of 259.  He was started on statin by PCP.    4)  Weight/Diet:  his Body mass index is 47.99 kg/m.  -  clearly complicating his diabetes care.   he is a candidate for weight loss. I discussed with him the fact that loss of 5 - 10% of his  current body weight will have the most impact on his diabetes management.  Exercise, and detailed carbohydrates information provided  -  detailed on discharge instructions.  5) Chronic Care/Health Maintenance: -he is on ACEI/ARB and is not on Statin medications and is encouraged to initiate and continue to follow up with Ophthalmology, Dentist, Podiatrist at least yearly or according to recommendations, and advised to stay away from smoking. I have recommended yearly flu vaccine and pneumonia vaccine at least every 5 years; moderate intensity exercise for up to 150 minutes weekly; and sleep for at least 7 hours a day.  - he is advised to maintain close follow up with Creola Corn, MD for primary care needs, as well as his other  providers for optimal and coordinated care.       I spent  45  minutes in the care of the patient today including review of labs from CMP, Lipids, Thyroid Function, Hematology (current and previous including abstractions from other facilities); face-to-face time discussing  his blood glucose readings/logs, discussing hypoglycemia and hyperglycemia episodes and symptoms, medications doses, his options of short and long term treatment based on the latest standards of care / guidelines;  discussion about incorporating lifestyle medicine;  and documenting the encounter. Risk reduction counseling performed per USPSTF guidelines to reduce obesity and cardiovascular risk factors.     Please refer to Patient Instructions for Blood Glucose Monitoring and Insulin/Medications Dosing Guide"  in media tab for additional information. Please  also refer to " Patient Self Inventory" in the Media  tab for reviewed elements of pertinent patient history.  Henry Dougherty participated in the discussions, expressed understanding, and voiced agreement with the above plans.  All questions were answered to his satisfaction. he is encouraged to contact clinic should he have any questions or concerns prior to his return visit.    Follow up plan: - Return in about 4 months (around 04/02/2024) for Diabetes F/U with A1c in office, No previsit labs.   Ronny Bacon, [facility] [address] [place], [place] [address] Phone: [phone] Fax: [phone]  12/04/2023, 8:32 AM

## 2023-12-08 ENCOUNTER — Ambulatory Visit: Payer: BLUE CROSS/BLUE SHIELD | Admitting: Podiatry

## 2023-12-08 ENCOUNTER — Encounter: Payer: Self-pay | Admitting: Podiatry

## 2023-12-08 DIAGNOSIS — M1A3710 Chronic gout due to renal impairment, right ankle and foot, without tophus (tophi): Secondary | ICD-10-CM | POA: Diagnosis not present

## 2023-12-08 DIAGNOSIS — M79674 Pain in right toe(s): Secondary | ICD-10-CM

## 2023-12-08 DIAGNOSIS — B351 Tinea unguium: Secondary | ICD-10-CM | POA: Diagnosis not present

## 2023-12-08 DIAGNOSIS — M79675 Pain in left toe(s): Secondary | ICD-10-CM

## 2023-12-08 DIAGNOSIS — E1140 Type 2 diabetes mellitus with diabetic neuropathy, unspecified: Secondary | ICD-10-CM | POA: Diagnosis not present

## 2023-12-08 MED ORDER — TERBINAFINE HCL 250 MG PO TABS: 250.0000 mg | ORAL_TABLET | Freq: Every day | ORAL | 0 refills | Status: AC

## 2023-12-08 NOTE — Progress Notes (Unsigned)
Chief Complaint  Patient presents with   Follow Up    Discuss pathology report. Did have a flare of gout over the past couple of days in RT foot, but it is improving. LT foot is still the same, describes it as "neuropathy"     HPI: 63 y.o. male presents today to follow-up on nail pathology.  This was positive for fungus.  Reports recent gout flare with some residual soreness and swelling to the right forefoot but states that this does appear to be improving.  Neuropathic pain overall unchanged. He does not wish to start pharmacologic treatment at this time.  Recently saw his endocrinologist for diabetes, last A1c 6.1%  Past Medical History:  Diagnosis Date   Allergic rhinitis    Atrial fibrillation with RVR (HCC) 03/2020   CHF (congestive heart failure) (HCC)    EF 45%/ VALVULAR CM   DOE (dyspnea on exertion)    Dyspepsia    Environmental allergies    Hypertension    OA (osteoarthritis)    Obese    Preglaucoma    SOB (shortness of breath)    Uveitis    AS A TEENAGER    Past Surgical History:  Procedure Laterality Date   CARDIAC SURGERY     CARDIOVERSION N/A 04/01/2020   Procedure: CARDIOVERSION;  Surgeon: Sande Rives, MD;  Location: [facility] ENDOSCOPY;  Service: Cardiovascular;  Laterality: N/A;   COLONOSCOPY     PT WAS 49 IN [place]- 2-3 TINY POLYPS   COLONOSCOPY WITH PROPOFOL N/A 04/21/2022   Procedure: COLONOSCOPY WITH PROPOFOL;  Surgeon: Jenel Lucks, MD;  Location: [facility] ENDOSCOPY;  Service: Gastroenterology;  Laterality: N/A;   KNEE ARTHROSCOPY Right 2005   MITRAL VALVE REPAIR     POLYPECTOMY  04/21/2022   Procedure: POLYPECTOMY;  Surgeon: Jenel Lucks, MD;  Location: [facility] ENDOSCOPY;  Service: Gastroenterology;;   SHOULDER ARTHROSCOPY Right 2005   TEE WITHOUT CARDIOVERSION N/A 04/01/2016   Procedure: TRANSESOPHAGEAL ECHOCARDIOGRAM (TEE);  Surgeon: Oather Muilenburg Bathe, MD;  Location: [facility] ENDOSCOPY;  Service: Cardiovascular;  Laterality: N/A;   TEE WITHOUT  CARDIOVERSION N/A 04/01/2020   Procedure: TRANSESOPHAGEAL ECHOCARDIOGRAM (TEE);  Surgeon: Sande Rives, MD;  Location: [facility] ENDOSCOPY;  Service: Cardiovascular;  Laterality: N/A;    Allergies  Allergen Reactions   Amiodarone Other (See Comments)    Blisters and whelps to IV FORM.   Cortisone     Glaucoma HX ,  Triggers increased pressure in eye    ROS    Physical Exam: There were no vitals filed for this visit.  General: The patient is alert and oriented x3 in no acute distress.  Dermatology: Skin is warm, dry and supple bilateral lower extremities. Interspaces are clear of maceration and debris.  Thickening, yellow discoloration, subungual debris present to the toenails bilaterally, left great toenail most notable.  Vascular: Palpable pedal pulses bilaterally. Capillary refill within normal limits.  No diffuse appreciable edema.  No erythema or calor.  Neurological: Light touch sensation grossly intact bilateral feet.  Protective sensation diminished, vibratory sensation absent  Musculoskeletal Exam: Tenderness on palpation of the lesser MPJs right foot dorsal plantar.  Some residual edema here.   Assessment/Plan of Care: 1. Pain due to onychomycosis of toenails of both feet   2. Chronic gout of right foot due to renal impairment without tophus   3. Diabetic neuropathy, painful (HCC)      Meds ordered this encounter  Medications   terbinafine (LAMISIL) 250 MG  tablet    Sig: Take 1 tablet (250 mg total) by mouth daily.    Dispense:  90 tablet    Refill:  0   None  Discussed clinical findings with patient today.  # Pain due to onychomycosis -Will start patient on oral terbinafine - Obtain CBC and hepatic function panel - Return in 3 months to monitor for progress.  May have nails trimmed at this time as well  # Chronic gout right foot, due to renal impairment -Discussed with patient.  Okay for him to take home regimen of colchicine until symptoms resolve - May  use over-the-counter Voltaren gel.  # Neuropathy pain associated with diabetes - Patient states that he has been prediabetic.  Does have diagnosis of diabetes from endocrinologist - Wishing to defer oral medication further at this time. Discussed that over the counter magnesium and vitamin B supplementation may be helpful.   Rufus Cypert L. Marchia Bond, AACFAS [facility]     [address] [place], [place] [address]                Office [phone]  Fax [phone]

## 2023-12-09 LAB — CBC WITH DIFFERENTIAL/PLATELET
Basophils Absolute: 0 10*3/uL (ref 0.0–0.2)
Basos: 0 %
EOS (ABSOLUTE): 0.2 10*3/uL (ref 0.0–0.4)
Eos: 2 %
Hematocrit: 50.1 % (ref 37.5–51.0)
Hemoglobin: 16.5 g/dL (ref 13.0–17.7)
Immature Grans (Abs): 0.1 10*3/uL (ref 0.0–0.1)
Immature Granulocytes: 1 %
Lymphocytes Absolute: 1.4 10*3/uL (ref 0.7–3.1)
Lymphs: 15 %
MCH: 31.3 pg (ref 26.6–33.0)
MCHC: 32.9 g/dL (ref 31.5–35.7)
MCV: 95 fL (ref 79–97)
Monocytes Absolute: 0.8 10*3/uL (ref 0.1–0.9)
Monocytes: 8 %
Neutrophils Absolute: 7.1 10*3/uL — ABNORMAL HIGH (ref 1.4–7.0)
Neutrophils: 74 %
Platelets: 386 10*3/uL (ref 150–450)
RBC: 5.28 x10E6/uL (ref 4.14–5.80)
RDW: 12.3 % (ref 11.6–15.4)
WBC: 9.6 10*3/uL (ref 3.4–10.8)

## 2023-12-09 LAB — HEPATIC FUNCTION PANEL
ALT: 26 [IU]/L (ref 0–44)
AST: 17 [IU]/L (ref 0–40)
Albumin: 4.5 g/dL (ref 3.9–4.9)
Alkaline Phosphatase: 60 [IU]/L (ref 44–121)
Bilirubin Total: 0.3 mg/dL (ref 0.0–1.2)
Bilirubin, Direct: 0.14 mg/dL (ref 0.00–0.40)
Total Protein: 6.8 g/dL (ref 6.0–8.5)

## 2024-02-22 ENCOUNTER — Other Ambulatory Visit: Payer: Self-pay | Admitting: Cardiology

## 2024-03-08 ENCOUNTER — Ambulatory Visit: Payer: BLUE CROSS/BLUE SHIELD | Admitting: Podiatry

## 2024-03-12 ENCOUNTER — Other Ambulatory Visit: Payer: Self-pay | Admitting: Nurse Practitioner

## 2024-03-13 ENCOUNTER — Other Ambulatory Visit: Payer: Self-pay | Admitting: Nurse Practitioner

## 2024-03-15 ENCOUNTER — Other Ambulatory Visit: Payer: Self-pay | Admitting: Cardiology

## 2024-03-22 ENCOUNTER — Ambulatory Visit (INDEPENDENT_AMBULATORY_CARE_PROVIDER_SITE_OTHER): Admitting: Podiatry

## 2024-03-22 ENCOUNTER — Encounter: Payer: Self-pay | Admitting: Podiatry

## 2024-03-22 VITALS — Ht 69.0 in | Wt 325.0 lb

## 2024-03-22 DIAGNOSIS — E1140 Type 2 diabetes mellitus with diabetic neuropathy, unspecified: Secondary | ICD-10-CM | POA: Diagnosis not present

## 2024-03-22 DIAGNOSIS — B351 Tinea unguium: Secondary | ICD-10-CM | POA: Diagnosis not present

## 2024-03-22 DIAGNOSIS — M79674 Pain in right toe(s): Secondary | ICD-10-CM

## 2024-03-22 DIAGNOSIS — M79675 Pain in left toe(s): Secondary | ICD-10-CM | POA: Diagnosis not present

## 2024-03-22 NOTE — Progress Notes (Unsigned)
  Subjective:  Patient ID: Henry Dougherty, male    DOB: 06-10-1961,  MRN: [id number]  Chief Complaint  Patient presents with   Nail Problem    RFC    63 y.o. male presents with the above complaint. History confirmed with patient. Patient presenting with pain related to dystrophic thickened elongated nails. Patient is unable to trim own nails related to nail dystrophy and/or mobility issues. Patient does have a history of T2DM.  No significant callus today.  Does endorse neuropathic pain.  He has completed 90-day course of oral Lamisil .  Objective:  Physical Exam: warm, good capillary refill, diminished pedal hair growth. nail exam onychomycosis of the toenails, onycholysis, and dystrophic nails DP pulses palpable, PT pulses palpable, and protective sensation absent Left Foot:  Pain with palpation of nails due to elongation and dystrophic growth.  Right Foot: Pain with palpation of nails due to elongation and dystrophic growth.  Left first toenail most affected, there is about 15% proximal clearance here. Assessment:   1. Pain due to onychomycosis of toenails of both feet   2. Diabetic neuropathy, painful (HCC)      Plan:  Patient was evaluated and treated and all questions answered.  #Onychomycosis with pain  -Nails palliatively debrided as below. -Educated on self-care - He has had about 15% clearance of left proximal first toenail. - We discussed continuing the oral terbinafine , he is unsure on this.  We will proceed with getting blood work to monitor for any changes to liver function.  Will have him contact office if he would like to continue.  Procedure: Nail Debridement Rationale: Pain Type of Debridement: manual, sharp debridement. Instrumentation: Nail nipper, rotary burr. Number of Nails: 10  Patient educated on diabetes. Discussed proper diabetic foot care and discussed risks and complications of disease. Educated patient in depth on reasons to return to the office  immediately should he/she discover anything concerning or new on the feet. All questions answered. Discussed proper shoes as well.    Return in about 3 months (around 06/22/2024) for Diabetic Foot Care.         Eve Hinders, DPM [facility] / [facility]

## 2024-03-23 ENCOUNTER — Other Ambulatory Visit: Payer: Self-pay | Admitting: Cardiology

## 2024-03-23 LAB — CBC
Hematocrit: 48.9 % (ref 37.5–51.0)
Hemoglobin: 16.8 g/dL (ref 13.0–17.7)
MCH: 32.6 pg (ref 26.6–33.0)
MCHC: 34.4 g/dL (ref 31.5–35.7)
MCV: 95 fL (ref 79–97)
Platelets: 330 10*3/uL (ref 150–450)
RBC: 5.15 x10E6/uL (ref 4.14–5.80)
RDW: 12.7 % (ref 11.6–15.4)
WBC: 12.1 10*3/uL — ABNORMAL HIGH (ref 3.4–10.8)

## 2024-03-23 LAB — HEPATIC FUNCTION PANEL
ALT: 20 IU/L (ref 0–44)
AST: 21 IU/L (ref 0–40)
Albumin: 4.8 g/dL (ref 3.9–4.9)
Alkaline Phosphatase: 63 IU/L (ref 44–121)
Bilirubin Total: 0.4 mg/dL (ref 0.0–1.2)
Bilirubin, Direct: 0.16 mg/dL (ref 0.00–0.40)
Total Protein: 6.8 g/dL (ref 6.0–8.5)

## 2024-03-24 ENCOUNTER — Encounter: Payer: Self-pay | Admitting: Podiatry

## 2024-03-25 ENCOUNTER — Encounter: Payer: Self-pay | Admitting: Podiatry

## 2024-03-25 ENCOUNTER — Other Ambulatory Visit: Payer: Self-pay | Admitting: Podiatry

## 2024-03-25 DIAGNOSIS — B351 Tinea unguium: Secondary | ICD-10-CM

## 2024-03-25 MED ORDER — TERBINAFINE HCL 250 MG PO TABS: 250.0000 mg | ORAL_TABLET | Freq: Every day | ORAL | 0 refills | Status: AC

## 2024-03-25 NOTE — Progress Notes (Signed)
 Reviewed patient's labs, he would like to proceed with another 90 course of PO Lamisil . Sending into patient's pharmacy.

## 2024-04-03 ENCOUNTER — Ambulatory Visit: Payer: BLUE CROSS/BLUE SHIELD | Admitting: Nurse Practitioner

## 2024-04-03 ENCOUNTER — Telehealth: Payer: Self-pay

## 2024-04-03 ENCOUNTER — Other Ambulatory Visit (HOSPITAL_COMMUNITY): Payer: Self-pay

## 2024-04-03 ENCOUNTER — Encounter: Payer: Self-pay | Admitting: Nurse Practitioner

## 2024-04-03 VITALS — BP 138/80 | HR 75 | Ht 69.0 in | Wt 320.0 lb

## 2024-04-03 DIAGNOSIS — E782 Mixed hyperlipidemia: Secondary | ICD-10-CM | POA: Diagnosis not present

## 2024-04-03 DIAGNOSIS — Z7985 Long-term (current) use of injectable non-insulin antidiabetic drugs: Secondary | ICD-10-CM

## 2024-04-03 DIAGNOSIS — E119 Type 2 diabetes mellitus without complications: Secondary | ICD-10-CM

## 2024-04-03 LAB — POCT GLYCOSYLATED HEMOGLOBIN (HGB A1C): Hemoglobin A1C: 6.3 % — AB (ref 4.0–5.6)

## 2024-04-03 MED ORDER — TIRZEPATIDE 12.5 MG/0.5ML ~~LOC~~ SOAJ: 12.5000 mg | SUBCUTANEOUS | 1 refills | Status: DC

## 2024-04-03 NOTE — Progress Notes (Signed)
 Endocrinology Follow Up Note       04/03/2024, 8:23 AM   Subjective:    Patient ID: Henry Dougherty, male    DOB: July 23, 1961.  Henry Dougherty is being seen in follow up after being seen for management of diabetes requested by  Margarete Sharps, MD.   Past Medical History:  Diagnosis Date   Allergic rhinitis    Atrial fibrillation with RVR (HCC) 03/2020   CHF (congestive heart failure) (HCC)    EF 45%/ VALVULAR CM   DOE (dyspnea on exertion)    Dyspepsia    Environmental allergies    Hypertension    OA (osteoarthritis)    Obese    Preglaucoma    SOB (shortness of breath)    Uveitis    AS A TEENAGER    Past Surgical History:  Procedure Laterality Date   CARDIAC SURGERY     CARDIOVERSION N/A 04/01/2020   Procedure: CARDIOVERSION;  Surgeon: Harrold Lincoln, MD;  Location: [facility] ENDOSCOPY;  Service: Cardiovascular;  Laterality: N/A;   COLONOSCOPY     PT WAS 47 IN [place]- 2-3 TINY POLYPS   COLONOSCOPY WITH PROPOFOL  N/A 04/21/2022   Procedure: COLONOSCOPY WITH PROPOFOL ;  Surgeon: Elois Hair, MD;  Location: [facility] ENDOSCOPY;  Service: Gastroenterology;  Laterality: N/A;   KNEE ARTHROSCOPY Right 2005   MITRAL VALVE REPAIR     POLYPECTOMY  04/21/2022   Procedure: POLYPECTOMY;  Surgeon: Elois Hair, MD;  Location: [facility] ENDOSCOPY;  Service: Gastroenterology;;   SHOULDER ARTHROSCOPY Right 2005   TEE WITHOUT CARDIOVERSION N/A 04/01/2016   Procedure: TRANSESOPHAGEAL ECHOCARDIOGRAM (TEE);  Surgeon: Hugh Madura, MD;  Location: [facility] ENDOSCOPY;  Service: Cardiovascular;  Laterality: N/A;   TEE WITHOUT CARDIOVERSION N/A 04/01/2020   Procedure: TRANSESOPHAGEAL ECHOCARDIOGRAM (TEE);  Surgeon: Harrold Lincoln, MD;  Location: [facility] ENDOSCOPY;  Service: Cardiovascular;  Laterality: N/A;    Social History   Socioeconomic History   Marital status: Married    Spouse name: Not on file   Number of children: 3   Years of  education: Not on file   Highest education level: Not on file  Occupational History   Occupation: VP FOR [employer]  Tobacco Use   Smoking status: Never   Smokeless tobacco: Never  Vaping Use   Vaping status: Never Used  Substance and Sexual Activity   Alcohol use: Yes   Drug use: No   Sexual activity: Not on file  Other Topics Concern   Not on file  Social History Narrative   Not on file   Social Drivers of Health   Financial Resource Strain: Not on file  Food Insecurity: Not on file  Transportation Needs: Not on file  Physical Activity: Not on file  Stress: Not on file  Social Connections: Not on file    Family History  Problem Relation Age of Onset   Heart Problems Mother    Atrial fibrillation Mother    Colon cancer Father    Heart Problems Father    Heart attack Maternal Grandfather        died from heart attack in his 6's   Heart attack Paternal Grandfather    Stroke Paternal  Grandfather    Pancreatic cancer Neg Hx    Esophageal cancer Neg Hx    Liver cancer Neg Hx    Stomach cancer Neg Hx     Outpatient Encounter Medications as of 04/03/2024  Medication Sig   Accu-Chek Softclix Lancets lancets USE AS INSTRUCTED TO MONITOR GLUCOSE TWICE DAILY   acetaminophen  (TYLENOL ) 500 MG tablet Take 1,000 mg by mouth every 6 (six) hours as needed for moderate pain or mild pain.   amLODipine  (NORVASC ) 5 MG tablet Take 1 tablet (5 mg total) by mouth daily.   amoxicillin  (AMOXIL ) 500 MG capsule TAKE FOUR CAPSULES BY MOUTH ONE HOUR BEFORE DENTAL APPOINTMENT   apixaban  (ELIQUIS ) 5 MG TABS tablet TAKE 1 TABLET BY MOUTH TWICE A DAY   colchicine 0.6 MG tablet Take 0.6 mg by mouth daily as needed (gout).   glucose blood (ACCU-CHEK GUIDE TEST) test strip USE TO MONITOR GLUCOSE DAILY AS INSTRUCTED   hydrochlorothiazide  (HYDRODIURIL ) 25 MG tablet TAKE 1 TABLET BY MOUTH EVERY DAY   metoprolol  succinate (TOPROL -XL) 50 MG 24 hr tablet TAKE 1 AND 1/2 TABLET BY MOUTH ONCE DAILY  IMMEDIATELY FOLLOWING A MEAL   rosuvastatin (CRESTOR) 10 MG tablet Take 10 mg by mouth daily.   terbinafine  (LAMISIL ) 250 MG tablet Take 1 tablet (250 mg total) by mouth daily.   tirzepatide  (MOUNJARO ) 12.5 MG/0.5ML Pen Inject 12.5 mg into the skin once a week.   valsartan  (DIOVAN ) 320 MG tablet TAKE 1/2 TABLET (160 MG TOTAL) BY MOUTH DAILY.   [DISCONTINUED] tirzepatide  (MOUNJARO ) 10 MG/0.5ML Pen Inject 10 mg into the skin once a week.   No facility-administered encounter medications on file as of 04/03/2024.    ALLERGIES: Allergies  Allergen Reactions   Amiodarone  Other (See Comments)    Blisters and whelps to IV FORM.   Cortisone     Glaucoma HX ,  Triggers increased pressure in eye    VACCINATION STATUS:  There is no immunization history on file for this patient.  Diabetes He presents for his follow-up diabetic visit. Diabetes type: prediabetes. Onset time: prediabetes since age 36. His disease course has been stable. Hypoglycemia symptoms include dizziness, nervousness/anxiousness, sweats and tremors. (When waits too long between meals) Associated symptoms include foot paresthesias and weight loss (slow). Symptoms are stable. Diabetic complications include heart disease (CHF- s/p mitral valve repair) and nephropathy. Risk factors for coronary artery disease include dyslipidemia, male sex, obesity, hypertension and sedentary lifestyle. Current diabetic treatment includes diet (and Mounjaro ). He is compliant with treatment most of the time. His weight is decreasing steadily. He is following a generally healthy and diabetic diet. Meal planning includes avoidance of concentrated sweets. He has not had a previous visit with a dietitian. He rarely participates in exercise. His home blood glucose trend is decreasing steadily. His breakfast blood glucose range is generally 110-130 mg/dl. (He presents today with his logs showing at mostly at goal fasting glycemic profile overall.  His POCT A1c  today is 6.3%, essentially unchanged from last visit.  He denies any significant hypoglycemia.  He is tolerating the 10 mg dose of Mounjaro  well, without any unpleasant side effects.  He notes he is not craving sweets anymore and is proud of his progress.  He did retire in January.  He has been more active with the warmer weather.) An ACE inhibitor/angiotensin II receptor blocker is being taken. He does not see a podiatrist.Eye exam is not current.   Review of systems  Constitutional: + steadily decreasing body weight,  current Body mass index is 47.26 kg/m. , no fatigue, no subjective hyperthermia, no subjective hypothermia Eyes: no blurry vision, no xerophthalmia ENT: no sore throat, no nodules palpated in throat, no dysphagia/odynophagia, no hoarseness Cardiovascular: no chest pain, no shortness of breath, no palpitations, no leg swelling Respiratory: no cough, no shortness of breath Gastrointestinal: no nausea/vomiting/diarrhea Musculoskeletal: no muscle/joint aches Skin: no rashes, no hyperemia Neurological: no tremors, no numbness, no tingling, no dizziness Psychiatric: no depression, no anxiety  Objective:     BP 138/80 (BP Location: Left Arm, Patient Position: Sitting, Cuff Size: Large)   Pulse 75   Ht 5\' 9"  (1.753 m)   Wt (!) 320 lb (145.2 kg)   BMI 47.26 kg/m   Wt Readings from Last 3 Encounters:  04/03/24 (!) 320 lb (145.2 kg)  03/22/24 (!) 325 lb (147.4 kg)  12/04/23 (!) 325 lb (147.4 kg)     BP Readings from Last 3 Encounters:  04/03/24 138/80  12/04/23 (!) 144/86  07/27/23 (!) 160/88      Physical Exam- Limited  Constitutional:  Body mass index is 47.26 kg/m. , not in acute distress, normal state of mind Eyes:  EOMI, no exophthalmos Musculoskeletal: no gross deformities, strength intact in all four extremities, no gross restriction of joint movements Skin:  no rashes, no hyperemia Neurological: no tremor with outstretched hands   Diabetic Foot Exam -  Simple   No data filed     CMP ( most recent) CMP     Component Value Date/Time   NA 139 03/10/2023 0950   K 5.2 03/10/2023 0950   CL 98 03/10/2023 0950   CO2 25 03/10/2023 0950   GLUCOSE 208 (H) 03/10/2023 0950   GLUCOSE 184 (H) 01/22/2021 0931   BUN 28 (A) 10/10/2023 0000   CREATININE 1.5 (A) 10/10/2023 0000   CREATININE 1.53 (H) 03/10/2023 0950   CREATININE 2.19 (H) 04/13/2016 1048   CALCIUM  10.4 (H) 03/10/2023 0950   PROT 6.8 03/22/2024 1304   ALBUMIN 4.8 03/22/2024 1304   AST 21 03/22/2024 1304   ALT 20 03/22/2024 1304   ALKPHOS 63 03/22/2024 1304   BILITOT 0.4 03/22/2024 1304   GFRNONAA 49 (L) 01/22/2021 0931   GFRAA 71 05/07/2020 1156     Diabetic Labs (most recent): Lab Results  Component Value Date   HGBA1C 6.3 (A) 04/03/2024   HGBA1C 6.1 10/10/2023   HGBA1C 6.1 (A) 07/27/2023     Lipid Panel ( most recent) Lipid Panel     Component Value Date/Time   TRIG 259 (A) 10/10/2023 0000   LDLCALC 134 10/10/2023 0000      Lab Results  Component Value Date   TSH 1.57 10/10/2023   TSH 1.459 03/30/2020   TSH 2.459 03/31/2016           Assessment & Plan:   1) Type 2 Diabetes mellitus without complication, without long-term current use of insulin  He presents today with his logs showing at mostly at goal fasting glycemic profile overall.  His POCT A1c today is 6.3%, essentially unchanged from last visit.  He denies any significant hypoglycemia.  He is tolerating the 10 mg dose of Mounjaro  well, without any unpleasant side effects.  He notes he is not craving sweets anymore and is proud of his progress.  He did retire in January.  He has been more active with the warmer weather.  - Henry Dougherty has diabetes since 63 years of age.   -Recent labs reviewed.  - I  had a long discussion with him about the progressive nature of diabetes and the pathology behind its complications. -his prediabetes is complicated by CHF, CKD stage 3a and he remains at a high  risk for more acute and chronic complications which include CAD, CVA, CKD, retinopathy, and neuropathy. These are all discussed in detail with him.  The following Lifestyle Medicine recommendations according to American College of Lifestyle Medicine [facility]) were discussed and offered to patient and he agrees to start the journey:  A. Whole Foods, Plant-based plate comprising of fruits and vegetables, plant-based proteins, whole-grain carbohydrates was discussed in detail with the patient.   A list for source of those nutrients were also provided to the patient.  Patient will use only water or unsweetened tea for hydration. B.  The need to stay away from risky substances including alcohol, smoking; obtaining 7 to 9 hours of restorative sleep, at least 150 minutes of moderate intensity exercise weekly, the importance of healthy social connections,  and stress reduction techniques were discussed. C.  A full color page of  Calorie density of various food groups per pound showing examples of each food groups was provided to the patient.  - Nutritional counseling repeated at each appointment due to patients tendency to fall back in to old habits.  - The patient admits there is a room for improvement in their diet and drink choices. -  Suggestion is made for the patient to avoid simple carbohydrates from their diet including Cakes, Sweet Desserts / Pastries, Ice Cream, Soda (diet and regular), Sweet Tea, Candies, Chips, Cookies, Sweet Pastries, Store Bought Juices, Alcohol in Excess of 1-2 drinks a day, Artificial Sweeteners, Coffee Creamer, and "Sugar-free" Products. This will help patient to have stable blood glucose profile and potentially avoid unintended weight gain.   - I encouraged the patient to switch to unprocessed or minimally processed complex starch and increased protein intake (animal or plant source), fruits, and vegetables.   - Patient is advised to stick to a routine mealtimes to eat 3 meals a  day and avoid unnecessary snacks (to snack only to correct hypoglycemia).  - I have approached him with the following individualized plan to manage his diabetes and patient agrees:   Will increase his Mounjaro  to 12.5 mg SQ weekly.    -he is encouraged to continue monitoring glucose at least once daily, before breakfast, and call the clinic if he has readings less than 70 or above 200 for 3 tests in row.  - Adjustment parameters are given to him for hypo and hyperglycemia in writing.  - he is not a candidate for Metformin due to concurrent renal insufficiency.  - Specific targets for  A1c; LDL, HDL, and Triglycerides were discussed with the patient.  2) Blood Pressure /Hypertension:  his blood pressure is controlled to target today. He is advised to continue medications as prescribed by PCP.   3) Lipids/Hyperlipidemia:    Review of his recent lipid panel from 10/10/23 showed uncontrolled LDL at 134 and elevated triglycerides of 259.  He was started on statin by PCP.    4)  Weight/Diet:  his Body mass index is 47.26 kg/m.  -  clearly complicating his diabetes care.   he is a candidate for weight loss. I discussed with him the fact that loss of 5 - 10% of his  current body weight will have the most impact on his diabetes management.  Exercise, and detailed carbohydrates information provided  -  detailed on discharge instructions.  5) Chronic Care/Health Maintenance: -he is on ACEI/ARB and is not on Statin medications and is encouraged to initiate and continue to follow up with Ophthalmology, Dentist, Podiatrist at least yearly or according to recommendations, and advised to stay away from smoking. I have recommended yearly flu vaccine and pneumonia vaccine at least every 5 years; moderate intensity exercise for up to 150 minutes weekly; and sleep for at least 7 hours a day.  - he is advised to maintain close follow up with Margarete Sharps, MD for primary care needs, as well as his other  providers for optimal and coordinated care.      I spent  41  minutes in the care of the patient today including review of labs from CMP, Lipids, Thyroid Function, Hematology (current and previous including abstractions from other facilities); face-to-face time discussing  his blood glucose readings/logs, discussing hypoglycemia and hyperglycemia episodes and symptoms, medications doses, his options of short and long term treatment based on the latest standards of care / guidelines;  discussion about incorporating lifestyle medicine;  and documenting the encounter. Risk reduction counseling performed per USPSTF guidelines to reduce obesity and cardiovascular risk factors.     Please refer to Patient Instructions for Blood Glucose Monitoring and Insulin/Medications Dosing Guide"  in media tab for additional information. Please  also refer to " Patient Self Inventory" in the Media  tab for reviewed elements of pertinent patient history.  Henry Dougherty participated in the discussions, expressed understanding, and voiced agreement with the above plans.  All questions were answered to his satisfaction. he is encouraged to contact clinic should he have any questions or concerns prior to his return visit.    Follow up plan: - Return in about 4 months (around 08/04/2024) for Diabetes F/U with A1c in office, Previsit labs.   Henry Dougherty, [facility] [address] [place], [place] [address] Phone: [phone] Fax: [phone]  04/03/2024, 8:23 AM

## 2024-04-03 NOTE — Telephone Encounter (Signed)
 Pharmacy Patient Advocate Encounter   Received notification from CoverMyMeds that prior authorization for Mounjaro  12.5MG /0.5ML auto-injectors is required/requested.   Insurance verification completed.   The patient is insured through [employer] .   Per test claim: PA required; PA submitted to above mentioned insurance via CoverMyMeds Key/confirmation #/EOC [id number] Status is pending

## 2024-04-04 NOTE — Telephone Encounter (Signed)
 Pharmacy Patient Advocate Encounter  Received notification from [employer] that Prior Authorization for Mounjaro  12.5MG /0.5ML auto-injectors has been APPROVED from 04-03-2024 to 04-07-2025   PA #/Case ID/Reference #: [id number]

## 2024-04-09 ENCOUNTER — Other Ambulatory Visit: Payer: Self-pay | Admitting: Cardiology

## 2024-04-11 NOTE — Telephone Encounter (Signed)
 Dr. Dorathy Gals pt. Last OV was 11/2022. He has had one attempt with this. Does Dr. Renna Cary want to refill? Please advise.

## 2024-04-18 ENCOUNTER — Telehealth: Payer: Self-pay | Admitting: Cardiology

## 2024-04-18 NOTE — Telephone Encounter (Signed)
  Per MyChart scheduling message:  Pt c/o BP issue: STAT if pt c/o blurred vision, one-sided weakness or slurred speech.  STAT if BP is GREATER than 180/120 TODAY.  STAT if BP is LESS than 90/60 and SYMPTOMATIC TODAY  1. What is your BP concern?   2. Have you taken any BP medication today?  3. What are your last 5 BP readings?  4. Are you having any other symptoms (ex. Dizziness, headache, blurred vision, passed out)?    1. My blood pressure has been fluxuating. I retired in January and have lost some weight, i think with less stress and weight loss,  my meds may need adjusting? I have been getting light headed and feeling weak when standing. 2. I do take my meds as prescribed. 3. I have been readings from 85/53 to 198/110 4. Yes, dizziness, weak feeling when getting up and feeling like I may pass out, however never passed out.   BP readings    6/2.  84/52.  1:20pm 6/2.  127/70. 3:30pm 6/2.  198/110 8:30 pm 6/3.   149/81. 8:00am 6/3.    90/52.   12:30pm 6/3.    106/57.  9:30pm 6/4.    105/54.   12:30pm 6/4.    226/108. 8:00pm   Obviously when my readings are under 100 systolic is when I feel lightheaded when standing.

## 2024-04-18 NOTE — Telephone Encounter (Signed)
 Pt called to report that his BP has been fluctuating drastically:  6/2.  84/52.  1:20pm 6/2.  127/70. 3:30pm 6/2.  198/110 8:30 pm 6/3.   149/81. 8:00am 6/3.    90/52.   12:30pm 6/3.    106/57.  9:30pm 6/4.    105/54.   12:30pm 6/4.    226/108. 8:00pm  He has been having symptoms of "hot flashes" when his BP spikes.... and after he sits down and relaxes his BP then drops very low.   He had recent labwork with his PCP and he is seeing his PCP this Monday 04/22/24... I made him an appt with Dr Renna Cary but not until 05/03/24... I will send to him for his review.

## 2024-04-20 ENCOUNTER — Other Ambulatory Visit: Payer: Self-pay | Admitting: Cardiology

## 2024-04-22 ENCOUNTER — Encounter (HOSPITAL_BASED_OUTPATIENT_CLINIC_OR_DEPARTMENT_OTHER): Payer: Self-pay | Admitting: Cardiology

## 2024-04-22 DIAGNOSIS — R0989 Other specified symptoms and signs involving the circulatory and respiratory systems: Secondary | ICD-10-CM

## 2024-04-22 DIAGNOSIS — I10 Essential (primary) hypertension: Secondary | ICD-10-CM

## 2024-04-23 NOTE — Telephone Encounter (Signed)
 Pt also requested this be sent over to you!

## 2024-04-25 NOTE — Telephone Encounter (Signed)
 Here ya go!

## 2024-04-25 NOTE — Telephone Encounter (Signed)
Pt following up with you.

## 2024-04-25 NOTE — Addendum Note (Signed)
 Addended by: Kenneith Stief D on: 04/25/2024 02:42 PM   Modules accepted: Orders

## 2024-04-29 NOTE — Telephone Encounter (Signed)
 Patient retired from high stress job Dec 31. He is not traveling anymore. Has lost 30lb.  Having episodes of very high and very low BP.  He had episodes in the past of low BP but they were very rare. Now much more frequent. When he is low-he can't stand up bc he feels like he will pass out. When they are really high he has reflux and a cold sweat. Doesn't do anything differently with his meds when he is high or low.  I stopped his amlodipine  on Thursday.   Yesterday- Reflux 200/100 in AM- cold sweat 2hr later it bottoms out Normalizes throughout the day 9 AM 220/111 12:30 PM 87/59 4PM 130/78 7PM 191/101 9PM 181/99  This AM 7AM 87/54 9 AM 97/64 130/74 Fell this AM bc he was lightheaded HR are low- no tachycardia  Hx of MV repair- wife would like repeat echo   Friday night takes mounjaro , every Tuesday he has a low episode, but now more frequently  Having issues with gout-will need to stop hydrochlorothiazide   Goes real high then crashes Drinks lots of water per pt report  Autonomic dysfunction? Pheochromocytoma?  Will check plasma metanephrines and catecholamines- fasting lab in AM Wear compression stockings  Seeing Dr. Renna Cary Friday Seek medical care if he worsens

## 2024-04-29 NOTE — Addendum Note (Signed)
 Addended by: Isabell Bonafede D on: 04/29/2024 03:57 PM   Modules accepted: Orders

## 2024-04-30 ENCOUNTER — Other Ambulatory Visit (HOSPITAL_COMMUNITY): Payer: Self-pay | Admitting: Internal Medicine

## 2024-05-01 ENCOUNTER — Other Ambulatory Visit: Payer: Self-pay

## 2024-05-01 ENCOUNTER — Inpatient Hospital Stay (HOSPITAL_BASED_OUTPATIENT_CLINIC_OR_DEPARTMENT_OTHER)
Admission: EM | Admit: 2024-05-01 | Discharge: 2024-05-07 | DRG: 305 | Disposition: A | Discharge status: Home / Self Care | Attending: Internal Medicine | Admitting: Internal Medicine

## 2024-05-01 ENCOUNTER — Emergency Department (HOSPITAL_BASED_OUTPATIENT_CLINIC_OR_DEPARTMENT_OTHER)

## 2024-05-01 DIAGNOSIS — E162 Hypoglycemia, unspecified: Secondary | ICD-10-CM

## 2024-05-01 DIAGNOSIS — D72829 Elevated white blood cell count, unspecified: Secondary | ICD-10-CM | POA: Diagnosis present

## 2024-05-01 DIAGNOSIS — H409 Unspecified glaucoma: Secondary | ICD-10-CM | POA: Diagnosis present

## 2024-05-01 DIAGNOSIS — E66813 Obesity, class 3: Secondary | ICD-10-CM | POA: Diagnosis present

## 2024-05-01 DIAGNOSIS — E1122 Type 2 diabetes mellitus with diabetic chronic kidney disease: Secondary | ICD-10-CM | POA: Diagnosis present

## 2024-05-01 DIAGNOSIS — E11649 Type 2 diabetes mellitus with hypoglycemia without coma: Secondary | ICD-10-CM | POA: Diagnosis present

## 2024-05-01 DIAGNOSIS — M1990 Unspecified osteoarthritis, unspecified site: Secondary | ICD-10-CM | POA: Diagnosis present

## 2024-05-01 DIAGNOSIS — I4892 Unspecified atrial flutter: Secondary | ICD-10-CM | POA: Diagnosis present

## 2024-05-01 DIAGNOSIS — Z888 Allergy status to other drugs, medicaments and biological substances status: Secondary | ICD-10-CM

## 2024-05-01 DIAGNOSIS — I5032 Chronic diastolic (congestive) heart failure: Secondary | ICD-10-CM | POA: Diagnosis present

## 2024-05-01 DIAGNOSIS — E875 Hyperkalemia: Secondary | ICD-10-CM | POA: Diagnosis not present

## 2024-05-01 DIAGNOSIS — I160 Hypertensive urgency: Secondary | ICD-10-CM | POA: Diagnosis not present

## 2024-05-01 DIAGNOSIS — Z79899 Other long term (current) drug therapy: Secondary | ICD-10-CM

## 2024-05-01 DIAGNOSIS — M109 Gout, unspecified: Secondary | ICD-10-CM | POA: Diagnosis present

## 2024-05-01 DIAGNOSIS — Z8249 Family history of ischemic heart disease and other diseases of the circulatory system: Secondary | ICD-10-CM

## 2024-05-01 DIAGNOSIS — Z800 Family history of malignant neoplasm of digestive organs: Secondary | ICD-10-CM

## 2024-05-01 DIAGNOSIS — I4891 Unspecified atrial fibrillation: Secondary | ICD-10-CM | POA: Diagnosis present

## 2024-05-01 DIAGNOSIS — Z7985 Long-term (current) use of injectable non-insulin antidiabetic drugs: Secondary | ICD-10-CM

## 2024-05-01 DIAGNOSIS — N1831 Chronic kidney disease, stage 3a: Secondary | ICD-10-CM | POA: Diagnosis present

## 2024-05-01 DIAGNOSIS — Z7901 Long term (current) use of anticoagulants: Secondary | ICD-10-CM

## 2024-05-01 DIAGNOSIS — I130 Hypertensive heart and chronic kidney disease with heart failure and stage 1 through stage 4 chronic kidney disease, or unspecified chronic kidney disease: Secondary | ICD-10-CM | POA: Diagnosis present

## 2024-05-01 DIAGNOSIS — K219 Gastro-esophageal reflux disease without esophagitis: Secondary | ICD-10-CM | POA: Diagnosis present

## 2024-05-01 DIAGNOSIS — K224 Dyskinesia of esophagus: Secondary | ICD-10-CM | POA: Diagnosis not present

## 2024-05-01 DIAGNOSIS — E16A2 Hypoglycemia level 2: Secondary | ICD-10-CM | POA: Diagnosis present

## 2024-05-01 DIAGNOSIS — T383X5A Adverse effect of insulin and oral hypoglycemic [antidiabetic] drugs, initial encounter: Secondary | ICD-10-CM | POA: Diagnosis present

## 2024-05-01 DIAGNOSIS — Z6841 Body Mass Index (BMI) 40.0 and over, adult: Secondary | ICD-10-CM

## 2024-05-01 DIAGNOSIS — G4733 Obstructive sleep apnea (adult) (pediatric): Secondary | ICD-10-CM | POA: Diagnosis present

## 2024-05-01 DIAGNOSIS — Z823 Family history of stroke: Secondary | ICD-10-CM

## 2024-05-01 DIAGNOSIS — Z9889 Other specified postprocedural states: Secondary | ICD-10-CM

## 2024-05-01 DIAGNOSIS — R1013 Epigastric pain: Secondary | ICD-10-CM

## 2024-05-01 LAB — CBG MONITORING, ED
Glucose-Capillary: 53 mg/dL — ABNORMAL LOW (ref 70–99)
Glucose-Capillary: 78 mg/dL (ref 70–99)
Glucose-Capillary: 90 mg/dL (ref 70–99)
Glucose-Capillary: 79 mg/dL (ref 70–99)
Glucose-Capillary: 82 mg/dL (ref 70–99)

## 2024-05-01 LAB — CBC WITH DIFFERENTIAL/PLATELET
Abs Immature Granulocytes: 0.04 10*3/uL (ref 0.00–0.07)
Basophils Absolute: 0.1 10*3/uL (ref 0.0–0.1)
Basophils Relative: 1 %
Eosinophils Absolute: 0.2 10*3/uL (ref 0.0–0.5)
Eosinophils Relative: 2 %
HCT: 45.6 % (ref 39.0–52.0)
Hemoglobin: 16.2 g/dL (ref 13.0–17.0)
Immature Granulocytes: 0 %
Lymphocytes Relative: 18 %
Lymphs Abs: 1.9 10*3/uL (ref 0.7–4.0)
MCH: 32.7 pg (ref 26.0–34.0)
MCHC: 35.5 g/dL (ref 30.0–36.0)
MCV: 91.9 fL (ref 80.0–100.0)
Monocytes Absolute: 1.3 10*3/uL — ABNORMAL HIGH (ref 0.1–1.0)
Monocytes Relative: 12 %
Neutro Abs: 7.1 10*3/uL (ref 1.7–7.7)
Neutrophils Relative %: 67 %
Platelets: 283 10*3/uL (ref 150–400)
RBC: 4.96 MIL/uL (ref 4.22–5.81)
RDW: 12.5 % (ref 11.5–15.5)
WBC: 10.6 10*3/uL — ABNORMAL HIGH (ref 4.0–10.5)
nRBC: 0 % (ref 0.0–0.2)

## 2024-05-01 LAB — COMPREHENSIVE METABOLIC PANEL WITH GFR
ALT: 21 IU/L (ref 0–44)
ALT: 19 U/L (ref 0–44)
AST: 17 IU/L (ref 0–40)
AST: 20 U/L (ref 15–41)
Albumin: 4.8 g/dL (ref 3.9–4.9)
Albumin: 4.3 g/dL (ref 3.5–5.0)
Alkaline Phosphatase: 74 IU/L (ref 44–121)
Alkaline Phosphatase: 59 U/L (ref 38–126)
Anion gap: 14 (ref 5–15)
BUN/Creatinine Ratio: 17 (ref 10–24)
BUN: 32 mg/dL — ABNORMAL HIGH (ref 8–27)
BUN: 39 mg/dL — ABNORMAL HIGH (ref 8–23)
Bilirubin Total: 0.4 mg/dL (ref 0.0–1.2)
CO2: 22 mmol/L (ref 20–29)
CO2: 28 mmol/L (ref 22–32)
Calcium: 10.5 mg/dL — ABNORMAL HIGH (ref 8.6–10.2)
Calcium: 10.3 mg/dL (ref 8.9–10.3)
Chloride: 97 mmol/L (ref 96–106)
Chloride: 101 mmol/L (ref 98–111)
Creatinine, Ser: 1.83 mg/dL — ABNORMAL HIGH (ref 0.76–1.27)
Creatinine, Ser: 1.8 mg/dL — ABNORMAL HIGH (ref 0.61–1.24)
GFR, Estimated: 42 mL/min — ABNORMAL LOW (ref >60)
Globulin, Total: 2.3 g/dL (ref 1.5–4.5)
Glucose, Bld: 46 mg/dL — ABNORMAL LOW (ref 70–99)
Glucose: 213 mg/dL — ABNORMAL HIGH (ref 70–99)
Potassium: 5.8 mmol/L — ABNORMAL HIGH (ref 3.5–5.2)
Potassium: 4.3 mmol/L (ref 3.5–5.1)
Sodium: 141 mmol/L (ref 134–144)
Sodium: 143 mmol/L (ref 135–145)
Total Bilirubin: 0.3 mg/dL (ref 0.0–1.2)
Total Protein: 7.1 g/dL (ref 6.0–8.5)
Total Protein: 7 g/dL (ref 6.5–8.1)
eGFR: 41 mL/min/{1.73_m2} — ABNORMAL LOW (ref >59)

## 2024-05-01 LAB — URINALYSIS, ROUTINE W REFLEX MICROSCOPIC
Bacteria, UA: NONE SEEN
Bilirubin Urine: NEGATIVE
Glucose, UA: 500 mg/dL — AB
Hgb urine dipstick: NEGATIVE
Ketones, ur: NEGATIVE mg/dL
Leukocytes,Ua: NEGATIVE
Nitrite: NEGATIVE
Protein, ur: NEGATIVE mg/dL
Specific Gravity, Urine: 1.017 (ref 1.005–1.030)
pH: 5.5 (ref 5.0–8.0)

## 2024-05-01 LAB — TROPONIN T, HIGH SENSITIVITY
Troponin T High Sensitivity: 33 ng/L — ABNORMAL HIGH (ref <19)
Troponin T High Sensitivity: 28 ng/L — ABNORMAL HIGH (ref <19)

## 2024-05-01 MED ORDER — SODIUM CHLORIDE 0.9 % IV BOLUS
1000.0000 mL | Freq: Once | INTRAVENOUS | Status: AC
  Administered 2024-05-01: 1000 mL via INTRAVENOUS

## 2024-05-01 MED ORDER — HYDRALAZINE HCL 20 MG/ML IJ SOLN
5.0000 mg | Freq: Once | INTRAMUSCULAR | Status: AC
  Administered 2024-05-01: 5 mg via INTRAVENOUS
  Filled 2024-05-01: qty 1

## 2024-05-01 MED ORDER — METOPROLOL SUCCINATE ER 25 MG PO TB24
50.0000 mg | ORAL_TABLET | Freq: Once | ORAL | Status: AC
  Administered 2024-05-01: 50 mg via ORAL
  Filled 2024-05-01: qty 2

## 2024-05-01 NOTE — Addendum Note (Signed)
 Addended by: Cariana Karge D on: 05/01/2024 03:00 PM   Modules accepted: Orders

## 2024-05-01 NOTE — ED Notes (Signed)
 Juice and crackers given.

## 2024-05-01 NOTE — ED Triage Notes (Signed)
 Patient states he has been having near syncopal episodes multiple times a day. States BP will be elevated (200s/100s) then drops to 80s/40s at home. Hx of mitral valve replacement. States BP drops regardless of positioning.

## 2024-05-01 NOTE — ED Provider Notes (Cosign Needed Addendum)
 [facility] EMERGENCY DEPARTMENT AT [facility] Provider Note   CSN: [id number] Arrival date & time: 05/01/24  1444     Patient presents with: Near Syncope   Henry Dougherty is a 63 y.o. male past medical history significant for a fib, hypertension, and CHF presents today for near syncopal episodes multiple times a day.  Patient states that his blood pressure will be elevated in the 200s systolic and then drop to the 40J systolics.  Patient does not note correlation between positioning and episodes.  Patient denies fever, chills, shortness of breath, chest pain, urinary symptoms, abdominal pain, blurred vision, any other complaints at this time.  Patient stopped his amlodipine  approximately 2 weeks ago, however has already taken his valsartan  today.  His doctor told him to stop taking his valsartan  as well today.  Has appointment with his cardiologist on 6/20 for same.    Near Syncope       Prior to Admission medications   Medication Sig Start Date End Date Taking? Authorizing Provider  Accu-Chek Softclix Lancets lancets USE AS INSTRUCTED TO MONITOR GLUCOSE TWICE DAILY 03/13/24   Wendel Hals, NP  acetaminophen  (TYLENOL ) 500 MG tablet Take 1,000 mg by mouth every 6 (six) hours as needed for moderate pain or mild pain.    [provider]  amoxicillin  (AMOXIL ) 500 MG capsule TAKE FOUR CAPSULES BY MOUTH 1 hr BEFORE DENTAL APPT-must have office visit for further refills or obtain from primary care 04/12/24   Hugh Madura, MD  apixaban  (ELIQUIS ) 5 MG TABS tablet TAKE 1 TABLET BY MOUTH TWICE A DAY 11/03/23   Hugh Madura, MD  colchicine 0.6 MG tablet Take 0.6 mg by mouth daily as needed (gout). 10/03/22   [provider]  glucose blood (ACCU-CHEK GUIDE TEST) test strip USE TO MONITOR GLUCOSE DAILY AS INSTRUCTED 03/12/24   Wendel Hals, NP  hydrochlorothiazide  (HYDRODIURIL ) 25 MG tablet TAKE 1 TABLET BY MOUTH EVERY DAY 09/27/23   Hugh Madura, MD   metoprolol  succinate (TOPROL -XL) 50 MG 24 hr tablet TAKE 1 AND 1/2 TABLET BY MOUTH ONCE DAILY IMMEDIATELY FOLLOWING A MEAL 05/05/23   Hugh Madura, MD  rosuvastatin (CRESTOR) 10 MG tablet Take 10 mg by mouth daily.    [provider]  terbinafine  (LAMISIL ) 250 MG tablet Take 1 tablet (250 mg total) by mouth daily. 03/25/24 06/23/24  Reina Cara, DPM  tirzepatide  (MOUNJARO ) 12.5 MG/0.5ML Pen Inject 12.5 mg into the skin once a week. 04/03/24   Wendel Hals, NP    Allergies: Amiodarone  and Cortisone    Review of Systems  Cardiovascular:  Positive for near-syncope.  Neurological:        Near syncope    Updated Vital Signs BP (!) 232/110   Pulse 94   Temp 98 F (36.7 C) (Oral)   Resp 20   SpO2 97%   Physical Exam Vitals and nursing note reviewed.  Constitutional:      General: He is not in acute distress.    Appearance: He is well-developed. He is obese. He is diaphoretic.  HENT:     Head: Normocephalic and atraumatic.     Right Ear: External ear normal.     Left Ear: External ear normal.     Mouth/Throat:     Mouth: Mucous membranes are moist.     Pharynx: Oropharynx is clear.   Eyes:     Conjunctiva/sclera: Conjunctivae normal.    Cardiovascular:     Rate  and Rhythm: Normal rate and regular rhythm.     Pulses: Normal pulses.     Heart sounds: Normal heart sounds. No murmur heard. Pulmonary:     Effort: Pulmonary effort is normal. No respiratory distress.     Breath sounds: Normal breath sounds.  Abdominal:     Palpations: Abdomen is soft.     Tenderness: There is no abdominal tenderness.   Musculoskeletal:        General: No swelling. Normal range of motion.     Cervical back: Normal range of motion and neck supple.     Right lower leg: No edema.     Left lower leg: No edema.   Skin:    General: Skin is warm.     Capillary Refill: Capillary refill takes less than 2 seconds.     Coloration: Skin is pale.   Neurological:     General: No focal  deficit present.     Mental Status: He is alert and oriented to person, place, and time.   Psychiatric:        Mood and Affect: Mood normal.     (all labs ordered are listed, but only abnormal results are displayed) Labs Reviewed  CBC WITH DIFFERENTIAL/PLATELET - Abnormal; Notable for the following components:      Result Value   WBC 10.6 (*)    Monocytes Absolute 1.3 (*)    All other components within normal limits  COMPREHENSIVE METABOLIC PANEL WITH GFR - Abnormal; Notable for the following components:   Glucose, Bld 46 (*)    BUN 39 (*)    Creatinine, Ser 1.80 (*)    GFR, Estimated 42 (*)    All other components within normal limits  URINALYSIS, ROUTINE W REFLEX MICROSCOPIC - Abnormal; Notable for the following components:   Glucose, UA 500 (*)    All other components within normal limits  CBG MONITORING, ED - Abnormal; Notable for the following components:   Glucose-Capillary 53 (*)    All other components within normal limits  TROPONIN T, HIGH SENSITIVITY - Abnormal; Notable for the following components:   Troponin T High Sensitivity 33 (*)    All other components within normal limits  TROPONIN T, HIGH SENSITIVITY - Abnormal; Notable for the following components:   Troponin T High Sensitivity 28 (*)    All other components within normal limits  CBG MONITORING, ED  CBG MONITORING, ED  CBG MONITORING, ED  CBG MONITORING, ED    EKG: EKG Interpretation Date/Time:  Wednesday May 01 2024 14:55:58 EDT Ventricular Rate:  88 PR Interval:  136 QRS Duration:  82 QT Interval:  388 QTC Calculation: 469 R Axis:   28  Text Interpretation: Normal sinus rhythm Nonspecific ST abnormality Confirmed by Jerilynn Montenegro [phone]) on 05/01/2024 3:04:49 PM  Radiology: Lenell Query Chest Portable 1 View Result Date: 05/01/2024 CLINICAL DATA:  Near-syncope. EXAM: PORTABLE CHEST 1 VIEW COMPARISON:  01/22/2021. FINDINGS: Stable cardiomegaly. Prior cardiac valve replacement and atrial clipping.  Minimal right basilar scarring/atelectasis. No focal consolidation, pleural effusion, or pneumothorax. No acute osseous abnormality. IMPRESSION: 1. Minimal right basilar scarring/atelectasis. Otherwise, no acute cardiopulmonary findings. 2. Cardiomegaly. Electronically Signed   By: Mannie Seek M.D.   On: 05/01/2024 16:41     Procedures   Medications Ordered in the ED  metoprolol  succinate (TOPROL -XL) 24 hr tablet 50 mg (has no administration in time range)  sodium chloride  0.9 % bolus 1,000 mL (0 mLs Intravenous Stopped 05/01/24 1853)  hydrALAZINE (APRESOLINE) injection 5 mg (  5 mg Intravenous Given 05/01/24 2200)                                    Medical Decision Making Amount and/or Complexity of Data Reviewed Labs: ordered. Radiology: ordered.  Risk Prescription drug management.   This patient presents to the ED for concern of near syncope, this involves an extensive number of treatment options, and is a complaint that carries with it a high risk of complications and morbidity.  The differential diagnosis includes Electrolyte abnormality, arrhythmia, STEMI, NSTEMI, vasovagal syncope, orthostatic hypotension  Additional history obtained:  Additional history obtained from EMR External records from outside source obtained and reviewed including cardiology note   Lab Tests:  I Ordered, and personally interpreted labs.  The pertinent results include: Mild leukocytosis at 10.5, hypoglycemia 46, elevated creatinine at 1.8 which is chronic for historical values, elevated bun at 39, UA with 500 glucose, elevated troponin at 33, 28   Imaging Studies ordered:  I ordered imaging studies including chest x-ray I independently visualized and interpreted imaging which showed minimal right basilar scarring/atelectasis.  Otherwise no acute cardiopulmonary findings I agree with the radiologist interpretation   Cardiac Monitoring: / EKG:  The patient was maintained on a cardiac  monitor.  I personally viewed and interpreted the cardiac monitored which showed an underlying rhythm of: NSR with nonspecific ST abnormality   Problem List / ED Course / Critical interventions / Medication management I ordered medication including IVF   Reevaluation of the patient after these medicines showed that the patient stayed the same I have reviewed the patients home medicines and have made adjustments as needed Orthostatic positive   Test / Admission - Considered:  Consulted hospitalist, Dr. Levern Reader who is agreeable to admission     Final diagnoses:  Hypoglycemia  Hypertensive urgency    ED Discharge Orders     None          Carie Charity, PA-C 05/01/24 2030    Carie Charity, PA-C 05/01/24 2221    Jerilynn Montenegro, MD 05/03/24 [phone]

## 2024-05-02 ENCOUNTER — Encounter (HOSPITAL_COMMUNITY): Payer: Self-pay | Admitting: Family Medicine

## 2024-05-02 DIAGNOSIS — R55 Syncope and collapse: Secondary | ICD-10-CM | POA: Diagnosis not present

## 2024-05-02 DIAGNOSIS — I160 Hypertensive urgency: Secondary | ICD-10-CM

## 2024-05-02 DIAGNOSIS — Z7401 Bed confinement status: Secondary | ICD-10-CM | POA: Diagnosis not present

## 2024-05-02 DIAGNOSIS — I10 Essential (primary) hypertension: Secondary | ICD-10-CM | POA: Diagnosis not present

## 2024-05-02 DIAGNOSIS — E162 Hypoglycemia, unspecified: Secondary | ICD-10-CM | POA: Diagnosis present

## 2024-05-02 LAB — CREATININE, SERUM
Creatinine, Ser: 1.72 mg/dL — ABNORMAL HIGH (ref 0.61–1.24)
GFR, Estimated: 44 mL/min — ABNORMAL LOW (ref >60)

## 2024-05-02 LAB — CBG MONITORING, ED: Glucose-Capillary: 183 mg/dL — ABNORMAL HIGH (ref 70–99)

## 2024-05-02 LAB — HIV ANTIBODY (ROUTINE TESTING W REFLEX): HIV Screen 4th Generation wRfx: NONREACTIVE

## 2024-05-02 LAB — CBC
HCT: 47.9 % (ref 39.0–52.0)
Hemoglobin: 16.1 g/dL (ref 13.0–17.0)
MCH: 31.3 pg (ref 26.0–34.0)
MCHC: 33.6 g/dL (ref 30.0–36.0)
MCV: 93.2 fL (ref 80.0–100.0)
Platelets: 321 10*3/uL (ref 150–400)
RBC: 5.14 MIL/uL (ref 4.22–5.81)
RDW: 12.6 % (ref 11.5–15.5)
WBC: 11.6 10*3/uL — ABNORMAL HIGH (ref 4.0–10.5)
nRBC: 0 % (ref 0.0–0.2)

## 2024-05-02 LAB — BRAIN NATRIURETIC PEPTIDE: B Natriuretic Peptide: 208.6 pg/mL — ABNORMAL HIGH (ref 0.0–100.0)

## 2024-05-02 LAB — MRSA NEXT GEN BY PCR, NASAL: MRSA by PCR Next Gen: NOT DETECTED

## 2024-05-02 MED ORDER — CARVEDILOL 12.5 MG PO TABS: 12.5000 mg | ORAL_TABLET | Freq: Two times a day (BID) | ORAL | Status: DC

## 2024-05-02 MED ORDER — CARVEDILOL 6.25 MG PO TABS: 6.2500 mg | ORAL_TABLET | Freq: Two times a day (BID) | ORAL | Status: DC

## 2024-05-02 MED ORDER — METOPROLOL SUCCINATE ER 50 MG PO TB24: 50.0000 mg | ORAL_TABLET | Freq: Once | ORAL | Status: DC

## 2024-05-02 MED ORDER — HYDROCHLOROTHIAZIDE 25 MG PO TABS
25.0000 mg | ORAL_TABLET | ORAL | Status: DC
  Filled 2024-05-02: qty 1

## 2024-05-02 MED ORDER — ACETAMINOPHEN 500 MG PO TABS
1000.0000 mg | ORAL_TABLET | Freq: Four times a day (QID) | ORAL | Status: DC | PRN
  Administered 2024-05-06: 1000 mg via ORAL
  Filled 2024-05-02: qty 2

## 2024-05-02 MED ORDER — APIXABAN 5 MG PO TABS
5.0000 mg | ORAL_TABLET | Freq: Two times a day (BID) | ORAL | Status: DC
  Administered 2024-05-02 – 2024-05-07 (×11): 5 mg via ORAL
  Filled 2024-05-02 (×9): qty 1
  Filled 2024-05-02: qty 2
  Filled 2024-05-02: qty 1

## 2024-05-02 MED ORDER — CARVEDILOL 12.5 MG PO TABS
12.5000 mg | ORAL_TABLET | Freq: Two times a day (BID) | ORAL | Status: DC
  Administered 2024-05-02 – 2024-05-07 (×9): 12.5 mg via ORAL
  Filled 2024-05-02 (×9): qty 1

## 2024-05-02 NOTE — Plan of Care (Signed)
  Problem: Education: Goal: Knowledge of General Education information will improve Description: Including pain rating scale, medication(s)/side effects and non-pharmacologic comfort measures Outcome: Progressing   Problem: Health Behavior/Discharge Planning: Goal: Ability to manage health-related needs will improve Outcome: Progressing   Problem: Clinical Measurements: Goal: Ability to maintain clinical measurements within normal limits will improve Outcome: Progressing   Problem: Clinical Measurements: Goal: Will remain free from infection Outcome: Progressing   Problem: Clinical Measurements: Goal: Respiratory complications will improve Outcome: Progressing   Problem: Clinical Measurements: Goal: Cardiovascular complication will be avoided Outcome: Progressing   Problem: Nutrition: Goal: Adequate nutrition will be maintained Outcome: Progressing   Problem: Coping: Goal: Level of anxiety will decrease Outcome: Progressing   Problem: Safety: Goal: Ability to remain free from injury will improve Outcome: Progressing   Problem: Skin Integrity: Goal: Risk for impaired skin integrity will decrease Outcome: Progressing   Problem: Pain Managment: Goal: General experience of comfort will improve and/or be controlled Outcome: Progressing

## 2024-05-02 NOTE — ED Notes (Signed)
 Called [facility] at [facility] for transport

## 2024-05-02 NOTE — H&P (Signed)
 History and Physical    Patient: Henry Dougherty ZOX:[id number] DOB: [birth year] DOA: 05/01/2024 DOS: the patient was seen and examined on 05/02/2024 PCP: Margarete Sharps, MD  Patient coming from: Home  Chief Complaint:  Chief Complaint  Patient presents with   Near Syncope   HPI: [name] is a 63 y.o. male with medical history significant of Afib, HFpEF, HTN, obesity, and glaucoma p/w presyncope iso wide blood pressure swings.  Pt states that he has been unwell for quite some time. Most recently, he had an episode where his legs gave out (no LOC or head strike) while in the kitchen that prompted him to present to the ED yesterday. Unfortunately, these episodes have been going on for the past 3 months, with his BP being very labile from SBP 180 to as low as to 70s. Of note, he retired from a very stressful job at the end of last year, where he traveled for 2 weeks at a time, and has lost 33lbs since starting Moun[employer] . Unfortunately, his labile BP episodes have worsened since this time as well.  In the ED, pt was hypertensive. Labs remarkable for Cr 1.8. CXR w/o focal consolidations and pt admitted to medicine.  Review of Systems: As mentioned in the history of present illness. All other systems reviewed and are negative. Past Medical History:  Diagnosis Date   Allergic rhinitis    Atrial fibrillation with RVR (HCC) 03/2020   CHF (congestive heart failure) (HCC)    EF 45%/ VALVULAR CM   DOE (dyspnea on exertion)    Dyspepsia    Environmental allergies    Hypertension    OA (osteoarthritis)    Obese    Preglaucoma    SOB (shortness of breath)    Uveitis    AS A TEENAGER   Past Surgical History:  Procedure Laterality Date   CARDIAC SURGERY     CARDIOVERSION N/A 04/01/2020   Procedure: CARDIOVERSION;  Surgeon: Harrold Lincoln, MD;  Location: [facility] ENDOSCOPY;  Service: Cardiovascular;  Laterality: N/A;   COLONOSCOPY     PT WAS 47 IN [place]- 2-3 TINY POLYPS   COLONOSCOPY WITH  PROPOFOL  N/A 04/21/2022   Procedure: COLONOSCOPY WITH PROPOFOL ;  Surgeon: Elois Hair, MD;  Location: [facility] ENDOSCOPY;  Service: Gastroenterology;  Laterality: N/A;   KNEE ARTHROSCOPY Right 2005   MITRAL VALVE REPAIR     POLYPECTOMY  04/21/2022   Procedure: POLYPECTOMY;  Surgeon: Elois Hair, MD;  Location: [facility] ENDOSCOPY;  Service: Gastroenterology;;   SHOULDER ARTHROSCOPY Right 2005   TEE WITHOUT CARDIOVERSION N/A 04/01/2016   Procedure: TRANSESOPHAGEAL ECHOCARDIOGRAM (TEE);  Surgeon: Hugh Madura, MD;  Location: [facility] ENDOSCOPY;  Service: Cardiovascular;  Laterality: N/A;   TEE WITHOUT CARDIOVERSION N/A 04/01/2020   Procedure: TRANSESOPHAGEAL ECHOCARDIOGRAM (TEE);  Surgeon: Harrold Lincoln, MD;  Location: [facility] ENDOSCOPY;  Service: Cardiovascular;  Laterality: N/A;   Social History:  reports that he has never smoked. He has never used smokeless tobacco. He reports current alcohol use. He reports that he does not use drugs.  Allergies  Allergen Reactions   Amiodarone  Other (See Comments)    Blisters and whelps to IV FORM.   Cortisone     Glaucoma HX ,  Triggers increased pressure in eye    Family History  Problem Relation Age of Onset   Heart Problems Mother    Atrial fibrillation Mother    Colon cancer Father    Heart Problems Father    Heart attack Maternal Grandfather  died from heart attack in his 67's   Heart attack Paternal Grandfather    Stroke Paternal Grandfather    Pancreatic cancer Neg Hx    Esophageal cancer Neg Hx    Liver cancer Neg Hx    Stomach cancer Neg Hx     Prior to Admission medications   Medication Sig Start Date End Date Taking? Authorizing Provider  acetaminophen  (TYLENOL ) 500 MG tablet Take 1,000 mg by mouth every 6 (six) hours as needed for moderate pain or mild pain.   Yes [provider]  amLODipine  (NORVASC ) 5 MG tablet Take 5 mg by mouth at bedtime.   Yes [provider]  amoxicillin  (AMOXIL ) 500 MG capsule TAKE  FOUR CAPSULES BY MOUTH 1 hr BEFORE DENTAL APPT-must have office visit for further refills or obtain from primary care 04/12/24  Yes Hugh Madura, MD  apixaban  (ELIQUIS ) 5 MG TABS tablet TAKE 1 TABLET BY MOUTH TWICE A DAY 11/03/23  Yes Hugh Madura, MD  colchicine 0.6 MG tablet Take 0.6 mg by mouth daily as needed (gout). 10/03/22  Yes [provider]  hydrochlorothiazide  (HYDRODIURIL ) 25 MG tablet TAKE 1 TABLET BY MOUTH EVERY DAY 09/27/23  Yes Hugh Madura, MD  metoprolol  succinate (TOPROL -XL) 50 MG 24 hr tablet TAKE 1 AND 1/2 TABLET BY MOUTH ONCE DAILY IMMEDIATELY FOLLOWING A MEAL 05/05/23  Yes Hugh Madura, MD  rosuvastatin (CRESTOR) 10 MG tablet Take 10 mg by mouth daily.   Yes [provider]  terbinafine  (LAMISIL ) 250 MG tablet Take 1 tablet (250 mg total) by mouth daily. 03/25/24 06/23/24 Yes Semon, Jake L, DPM  Timolol Maleate, Once-Daily, 0.5 % SOLN Apply 1 drop to eye daily. 04/05/24  Yes [provider]  tirzepatide  (MOUNJARO ) 12.5 MG/0.5ML Pen Inject 12.5 mg into the skin once a week. 04/03/24  Yes Wendel Hals, NP  valsartan  (DIOVAN ) 320 MG tablet Take 160 mg by mouth every morning.   Yes [provider]  Accu-Chek Softclix Lancets lancets USE AS INSTRUCTED TO MONITOR GLUCOSE TWICE DAILY 03/13/24   Wendel Hals, NP  glucose blood (ACCU-CHEK GUIDE TEST) test strip USE TO MONITOR GLUCOSE DAILY AS INSTRUCTED 03/12/24   Wendel Hals, NP    Physical Exam: Vitals:   05/02/24 1400 05/02/24 1409 05/02/24 1503 05/02/24 1508  BP: (!) 160/67  (!) 169/86   Pulse: 82  85   Resp: 20  17   Temp:  98.4 F (36.9 C) 98.4 F (36.9 C)   TempSrc:   Oral   SpO2: 98%  97%   Weight:    (!) 142.8 kg  Height:    5' 10 (1.778 m)   General: Alert, oriented x3, resting comfortably in no acute distress Respiratory: Lungs clear to auscultation bilaterally with normal respiratory effort; no w/r/r Cardiovascular: Regular rate and rhythm w/o  m/r/g   Data Reviewed:  Lab Results  Component Value Date   WBC 10.6 (H) 05/01/2024   HGB 16.2 05/01/2024   HCT 45.6 05/01/2024   MCV 91.9 05/01/2024   PLT 283 05/01/2024   Lab Results  Component Value Date   GLUCOSE 46 (L) 05/01/2024   CALCIUM  10.3 05/01/2024   NA 143 05/01/2024   K 4.3 05/01/2024   CO2 28 05/01/2024   CL 101 05/01/2024   BUN 39 (H) 05/01/2024   CREATININE 1.80 (H) 05/01/2024   Lab Results  Component Value Date   ALT 19 05/01/2024   AST 20 05/01/2024   ALKPHOS  59 05/01/2024   BILITOT 0.3 05/01/2024   Lab Results  Component Value Date   INR 1.2 01/22/2021   INR 1.2 04/01/2020   INR 5.4 04/13/2016    Radiology: No results found.  Assessment and Plan: 9M h/o Afib, HFpEF, HTN, obesity, and glaucoma p/w presyncope iso labile blood pressures.  HTN Labile blood pressure High suspicion for overmedication of HTN since losing 33lbs; his drastic BP dips may be explained by hypotension from Afib as well. -Tele for now; consider 14d holter monitor at discharge to capture Afib frequency as pt may benefit from anti-arrhythmic drug -PTA Eliquis  5mg  BID -HOLD and consider d/c altogether hydrochlorothiazide , amlodipine , and metoprolol  -Coreg 12.5mg  BID; tirate for normotension -Consider EP consult overnight if pt with episodes of AFRVR despite coreg -F/u TTE -F/u BNP   Advance Care Planning:   Code Status: Full Code   Consults: N/A  Family Communication: Wife in Epic  Severity of Illness: The appropriate patient status for this patient is OBSERVATION. Observation status is judged to be reasonable and necessary in order to provide the required intensity of service to ensure the patient's safety. The patient's presenting symptoms, physical exam findings, and initial radiographic and laboratory data in the context of their medical condition is felt to place them at decreased risk for further clinical deterioration. Furthermore, it is anticipated that the  patient will be medically stable for discharge from the hospital within 2 midnights of admission.    ------- I spent 55 minutes reviewing previous labs/notes, obtaining separate history at the bedside, counseling/discussing the treatment plan outlined above, ordering medications/tests, and performing clinical documentation.  Author: Arne Langdon, MD 05/02/2024 4:10 PM  For on call review [email or web address].

## 2024-05-02 NOTE — ED Notes (Signed)
 Patient states he wants to refuse the hydrochlorothiazide  d/t potential for gout flare up Provider notified.

## 2024-05-02 NOTE — TOC CM/SW Note (Signed)
 Transition of Care [facility]) - Inpatient Brief Assessment   Patient Details  Name: Henry Dougherty MRN: [id number] Date of Birth: Jul 13, 1961  [facility] CM/SW Contact:    Juliane Och, LCSW Phone Number: 05/02/2024, 3:37 PM   Clinical Narrative:  3:37 PM Per chart review, patient resides at home with spouse. Patient has a PCP and insurance. Patient does not have SNF or HH history. Patient has home CPAP and air mattress. Patient's preferred pharmacy is [facility] [address] [facility]. No TOC needs were identified at this time. TOC will continue to follow and be available to assist.  Transition of Care Asessment: Insurance and Status: Insurance coverage has been reviewed Patient has primary care physician: Yes Home environment has been reviewed: Private Residence Prior level of function:: N/A Prior/Current Home Services: No current home services Social Drivers of Health Review: SDOH reviewed no interventions necessary Readmission risk has been reviewed: Yes Transition of care needs: no transition of care needs at this time

## 2024-05-03 ENCOUNTER — Encounter (HOSPITAL_COMMUNITY): Payer: Self-pay | Admitting: Hospitalist

## 2024-05-03 ENCOUNTER — Ambulatory Visit (HOSPITAL_BASED_OUTPATIENT_CLINIC_OR_DEPARTMENT_OTHER): Payer: Self-pay | Admitting: Cardiology

## 2024-05-03 ENCOUNTER — Encounter (HOSPITAL_BASED_OUTPATIENT_CLINIC_OR_DEPARTMENT_OTHER): Payer: Self-pay

## 2024-05-03 ENCOUNTER — Observation Stay (HOSPITAL_COMMUNITY)

## 2024-05-03 DIAGNOSIS — I160 Hypertensive urgency: Secondary | ICD-10-CM | POA: Diagnosis not present

## 2024-05-03 DIAGNOSIS — R55 Syncope and collapse: Secondary | ICD-10-CM | POA: Diagnosis not present

## 2024-05-03 DIAGNOSIS — G4733 Obstructive sleep apnea (adult) (pediatric): Secondary | ICD-10-CM | POA: Diagnosis not present

## 2024-05-03 DIAGNOSIS — Z9889 Other specified postprocedural states: Secondary | ICD-10-CM

## 2024-05-03 DIAGNOSIS — I4892 Unspecified atrial flutter: Secondary | ICD-10-CM | POA: Diagnosis not present

## 2024-05-03 DIAGNOSIS — Z7901 Long term (current) use of anticoagulants: Secondary | ICD-10-CM

## 2024-05-03 DIAGNOSIS — Z6841 Body Mass Index (BMI) 40.0 and over, adult: Secondary | ICD-10-CM

## 2024-05-03 LAB — BASIC METABOLIC PANEL WITH GFR
Anion gap: 8 (ref 5–15)
BUN: 29 mg/dL — ABNORMAL HIGH (ref 8–23)
CO2: 27 mmol/L (ref 22–32)
Calcium: 8.9 mg/dL (ref 8.9–10.3)
Chloride: 104 mmol/L (ref 98–111)
Creatinine, Ser: 1.65 mg/dL — ABNORMAL HIGH (ref 0.61–1.24)
GFR, Estimated: 46 mL/min — ABNORMAL LOW (ref >60)
Glucose, Bld: 150 mg/dL — ABNORMAL HIGH (ref 70–99)
Potassium: 5.4 mmol/L — ABNORMAL HIGH (ref 3.5–5.1)
Sodium: 139 mmol/L (ref 135–145)

## 2024-05-03 LAB — TSH: TSH: 1.488 u[IU]/mL (ref 0.350–4.500)

## 2024-05-03 LAB — ECHOCARDIOGRAM COMPLETE BUBBLE STUDY
Area-P 1/2: 5.38 cm2
S' Lateral: 3.8 cm

## 2024-05-03 MED ORDER — PERFLUTREN LIPID MICROSPHERE
1.0000 mL | INTRAVENOUS | Status: AC | PRN
  Administered 2024-05-03: 5 mL via INTRAVENOUS

## 2024-05-03 MED ORDER — SODIUM ZIRCONIUM CYCLOSILICATE 5 G PO PACK
5.0000 g | PACK | Freq: Once | ORAL | Status: AC
  Administered 2024-05-03: 5 g via ORAL
  Filled 2024-05-03: qty 1

## 2024-05-03 MED ORDER — IRBESARTAN 150 MG PO TABS
150.0000 mg | ORAL_TABLET | Freq: Every day | ORAL | Status: DC
  Administered 2024-05-03 – 2024-05-07 (×5): 150 mg via ORAL
  Filled 2024-05-03 (×5): qty 1

## 2024-05-03 MED ORDER — PANTOPRAZOLE SODIUM 40 MG PO TBEC
40.0000 mg | DELAYED_RELEASE_TABLET | Freq: Every day | ORAL | Status: DC
  Administered 2024-05-03 – 2024-05-05 (×3): 40 mg via ORAL
  Filled 2024-05-03 (×3): qty 1

## 2024-05-03 MED ORDER — METOPROLOL TARTRATE 5 MG/5ML IV SOLN: 2.5000 mg | Freq: Four times a day (QID) | INTRAVENOUS | Status: DC | PRN

## 2024-05-03 NOTE — Progress Notes (Signed)
 PROGRESS NOTE  Henry Dougherty EXB:[id number] DOB: 04/27/1961 DOA: 05/01/2024 PCP: Margarete Sharps, MD  HPI/Recap of past 24 hours: Henry Dougherty is a 63 y.o. male with medical history significant of Afib/flutter, Maze procedure, prior mitral valve repair, PFO closure, HFpEF, HTN, obesity, and glaucoma p/w presyncope with labile BP readings which has been ongoing for about 3 months. Patient reports SBPs could go as high as 220s, and then once rechecked may be as low as SBP 80s without any interventions. Had an episode PTA, where his legs gave out, denies LOC or hitting his head. Of note, he retired from a very stressful job at the end of last year, where he traveled for 2 weeks at a time, and has lost 33lbs since starting Mounjaro . Unfortunately, his labile BP episodes have worsened since this time as well.  Of note, patient had followed up with cardiology about adjusting his meds. In the ED, pt was hypertensive. Labs remarkable for Cr 1.8. CXR w/o focal consolidations and pt admitted to medicine.    Today, discussed extensively with patient, wife and daughter at bedside.  Noted systolic BPs in the 240s, does report some reflux/heartburn whenever he feels like his BP is very elevated, in addition to sweating.  He also reports feeling dizzy and lightheaded when his BP readings dropped to the systolics of 70s to 80s.  Continues to deny any chest pain but does report some heartburn/reflux symptoms.  Patient denies any shortness of breath, abdominal pain, nausea/vomiting, fever/chills.    Assessment/Plan: Principal Problem:   Hypertensive urgency   Labile BP Hypertensive crisis Today BP elevated to about systolics of 240s and dropped down to systolics of 100s Last documented orthostatic vitals appeared normal, although complained about dizziness when standing Unclear etiology Troponin with flat trend, EKG with no acute ST changes Echo with EF of 60 to 65%, no regional wall motion abnormality, left  ventricular hypertrophy, prior MV repair with ring annuloplasty Cardiology consulted, appreciate recs, renal artery duplex, plasma metanephrines, 24-hour urine  catecholamines, may need CT imaging of adrenal glands Continue Coreg, ARB for now  CKD stage IIIa Daily BMP  Mild hyperkalemia Gave 1 dose of Lokelma Daily BMP  Prediabetes A1c 6.3 Continue Mounjaro   History of atrial fibs/flutter Heart rate noted in the 100s Continue Coreg, Eliquis   S/p mitral valve repair Adequate functioning by echo  Morbid obesity Lifestyle modification advised  Estimated body mass index is 45.18 kg/m as calculated from the following:   Height as of this encounter: 5' 10 (1.778 m).   Weight as of this encounter: 142.8 kg.     Code Status: Full  Family Communication: Discussed with daughter and wife at bedside  Disposition Plan: Status is: Observation The patient remains OBS appropriate and will d/c before 2 midnights.      Consultants: Cardiology  Procedures: None  Antimicrobials: None  DVT prophylaxis: Eliquis    Objective: Vitals:   05/03/24 0305 05/03/24 0731 05/03/24 1131 05/03/24 1501  BP: (!) 114/48 (!) 149/79 102/74 (!) 117/49  Pulse: 80 73 95 79  Resp: 19 20 16 16   Temp: 97.9 F (36.6 C) 97.9 F (36.6 C) 98 F (36.7 C) 98.3 F (36.8 C)  TempSrc: Oral Oral Oral Oral  SpO2: 95% 97% 98% 95%  Weight:      Height:        Intake/Output Summary (Last 24 hours) at 05/03/2024 1613 Last data filed at 05/03/2024 0740 Gross per 24 hour  Intake 840 ml  Output --  Net 840 ml   Filed Weights   05/02/24 1508  Weight: (!) 142.8 kg    Exam: General: NAD  Cardiovascular: S1, S2 present Respiratory: CTAB Abdomen: Soft, nontender, nondistended, bowel sounds present Musculoskeletal: No bilateral pedal edema noted Skin: Normal Psychiatry: Normal mood     Data Reviewed: CBC: Recent Labs  Lab 05/01/24 1525 05/02/24 1525  WBC 10.6* 11.6*  NEUTROABS 7.1  --    HGB 16.2 16.1  HCT 45.6 47.9  MCV 91.9 93.2  PLT 283 321   Basic Metabolic Panel: Recent Labs  Lab 04/30/24 0810 05/01/24 1525 05/02/24 1525 05/03/24 0258  NA 141 143  --  139  K 5.8* 4.3  --  5.4*  CL 97 101  --  104  CO2 22 28  --  27  GLUCOSE 213* 46*  --  150*  BUN 32* 39*  --  29*  CREATININE 1.83* 1.80* 1.72* 1.65*  CALCIUM  10.5* 10.3  --  8.9   GFR: Estimated Creatinine Clearance: 64.5 mL/min (A) (by C-G formula based on SCr of 1.65 mg/dL (H)). Liver Function Tests: Recent Labs  Lab 04/30/24 0810 05/01/24 1525  AST 17 20  ALT 21 19  ALKPHOS 74 59  BILITOT 0.4 0.3  PROT 7.1 7.0  ALBUMIN 4.8 4.3   No results for input(s): LIPASE, AMYLASE in the last 168 hours. No results for input(s): AMMONIA in the last 168 hours. Coagulation Profile: No results for input(s): INR, PROTIME in the last 168 hours. Cardiac Enzymes: No results for input(s): CKTOTAL, CKMB, CKMBINDEX, TROPONINI in the last 168 hours. BNP (last 3 results) No results for input(s): PROBNP in the last 8760 hours. HbA1C: No results for input(s): HGBA1C in the last 72 hours. CBG: Recent Labs  Lab 05/01/24 1630 05/01/24 1726 05/01/24 1854 05/01/24 2008 05/02/24 1415  GLUCAP 78 90 79 82 183*   Lipid Profile: No results for input(s): CHOL, HDL, LDLCALC, TRIG, CHOLHDL, LDLDIRECT in the last 72 hours. Thyroid Function Tests: No results for input(s): TSH, T4TOTAL, FREET4, T3FREE, THYROIDAB in the last 72 hours. Anemia Panel: No results for input(s): VITAMINB12, FOLATE, FERRITIN, TIBC, IRON, RETICCTPCT in the last 72 hours. Urine analysis:    Component Value Date/Time   COLORURINE YELLOW 05/01/2024 1526   APPEARANCEUR CLEAR 05/01/2024 1526   LABSPEC 1.017 05/01/2024 1526   PHURINE 5.5 05/01/2024 1526   GLUCOSEU 500 (A) 05/01/2024 1526   HGBUR NEGATIVE 05/01/2024 1526   BILIRUBINUR NEGATIVE 05/01/2024 1526   KETONESUR NEGATIVE 05/01/2024  1526   PROTEINUR NEGATIVE 05/01/2024 1526   NITRITE NEGATIVE 05/01/2024 1526   LEUKOCYTESUR NEGATIVE 05/01/2024 1526   Sepsis Labs: @LABRCNTIP (procalcitonin:4,lacticidven:4)  ) Recent Results (from the past 240 hours)  MRSA Next Gen by PCR, Nasal     Status: None   Collection Time: 05/02/24  3:08 PM   Specimen: Nasal Mucosa; Nasal Swab  Result Value Ref Range Status   MRSA by PCR Next Gen NOT DETECTED NOT DETECTED Final    Comment: (NOTE) The GeneXpert MRSA Assay (FDA approved for NASAL specimens only), is one component of a comprehensive MRSA colonization surveillance program. It is not intended to diagnose MRSA infection nor to guide or monitor treatment for MRSA infections. Test performance is not FDA approved in patients less than 11 years old. Performed at [facility], [address] [place], [place] [address]       Studies: ECHOCARDIOGRAM COMPLETE BUBBLE STUDY Result Date: 05/03/2024    ECHOCARDIOGRAM REPORT   Patient Name:  Henry Dougherty Date of Exam: 05/03/2024 Medical Rec #:  [id number]      Height:       70.0 in Accession #:    [id number]     Weight:       314.9 lb Date of Birth:  20-Jul-1961     BSA:          2.531 m Patient Age:    62 years       BP:           149/79 mmHg Patient Gender: M              HR:           77 bpm. Exam Location:  Inpatient Procedure: 2D Echo, Cardiac Doppler, Color Doppler, Intracardiac Opacification            Agent and Saline Contrast Bubble Study (Both Spectral and Color Flow            Doppler were utilized during procedure). Indications:    Syncope780.2/R55  History:        Patient has prior history of Echocardiogram examinations. PFO                 closure and CHF, Prosthetic annuloplasty ring present in the                 mitral position. Procedure date: 02/2016, Arrythmias:Atrial                 Fibrillation, Signs/Symptoms:Syncope; Risk Factors:Hypertension.  Sonographer:    Kip Peon RDCS Referring Phys: [id number] Arne Langdon   Sonographer Comments: Image acquisition challenging due to patient body habitus. IMPRESSIONS  1. Left ventricular ejection fraction, by estimation, is 60 to 65%. The left ventricle has normal function. The left ventricle has no regional wall motion abnormalities. There is mild left ventricular hypertrophy. Left ventricular diastolic parameters are indeterminate.  2. Right ventricular systolic function is normal. The right ventricular size is normal.  3. Left atrial size was severely dilated.  4. Right atrial size was mildly dilated.  5. Prior MV repair with ring annuloplasty trivial residual MR . The mitral valve has been repaired/replaced. Trivial mitral valve regurgitation. No evidence of mitral stenosis.  6. The aortic valve is tricuspid. There is mild calcification of the aortic valve. Aortic valve regurgitation is mild. Aortic valve sclerosis is present, with no evidence of aortic valve stenosis.  7. The inferior vena cava is dilated in size with >50% respiratory variability, suggesting right atrial pressure of 8 mmHg.  8. Agitated saline contrast bubble study was negative, with no evidence of any interatrial shunt. FINDINGS  Left Ventricle: Left ventricular ejection fraction, by estimation, is 60 to 65%. The left ventricle has normal function. The left ventricle has no regional wall motion abnormalities. Definity  contrast agent was given IV to delineate the left ventricular  endocardial borders. Strain was performed and the global longitudinal strain is indeterminate. The left ventricular internal cavity size was normal in size. There is mild left ventricular hypertrophy. Left ventricular diastolic parameters are indeterminate. Right Ventricle: The right ventricular size is normal. No increase in right ventricular wall thickness. Right ventricular systolic function is normal. Left Atrium: Left atrial size was severely dilated. Right Atrium: Right atrial size was mildly dilated. Pericardium: There is no  evidence of pericardial effusion. Mitral Valve: Prior MV repair with ring annuloplasty trivial residual MR. The mitral valve has been repaired/replaced. Trivial mitral valve regurgitation. No evidence of mitral valve stenosis.  MV peak gradient, 13.8 mmHg. The mean mitral valve gradient is 5.0 mmHg. Tricuspid Valve: The tricuspid valve is normal in structure. Tricuspid valve regurgitation is mild . No evidence of tricuspid stenosis. Aortic Valve: The aortic valve is tricuspid. There is mild calcification of the aortic valve. Aortic valve regurgitation is mild. Aortic valve sclerosis is present, with no evidence of aortic valve stenosis. Pulmonic Valve: The pulmonic valve was normal in structure. Pulmonic valve regurgitation is not visualized. No evidence of pulmonic stenosis. Aorta: The aortic root is normal in size and structure. Venous: The inferior vena cava is dilated in size with greater than 50% respiratory variability, suggesting right atrial pressure of 8 mmHg. IAS/Shunts: No atrial level shunt detected by color flow Doppler. Agitated saline contrast was given intravenously to evaluate for intracardiac shunting. Agitated saline contrast bubble study was negative, with no evidence of any interatrial shunt. Additional Comments: 3D was performed not requiring image post processing on an independent workstation and was indeterminate.  LEFT VENTRICLE PLAX 2D LVIDd:         5.80 cm   Diastology LVIDs:         3.80 cm   LV e' medial:    12.60 cm/s LV PW:         1.00 cm   LV E/e' medial:  13.7 LV IVS:        1.30 cm   LV e' lateral:   9.57 cm/s LVOT diam:     2.40 cm   LV E/e' lateral: 18.1 LVOT Area:     4.52 cm  RIGHT VENTRICLE             IVC RV Basal diam:  4.20 cm     IVC diam: 2.60 cm RV Mid diam:    3.60 cm RV S prime:     11.80 cm/s LEFT ATRIUM             Index        RIGHT ATRIUM           Index LA diam:        5.40 cm 2.13 cm/m   RA Area:     20.00 cm LA Vol (A2C):   76.6 ml 30.26 ml/m  RA Volume:    48.90 ml  19.32 ml/m LA Vol (A4C):   83.4 ml 32.95 ml/m LA Biplane Vol: 81.5 ml 32.19 ml/m   AORTA Ao Root diam: 3.40 cm MITRAL VALVE MV Area (PHT): 5.38 cm     SHUNTS MV Peak grad:  13.8 mmHg    Systemic Diam: 2.40 cm MV Mean grad:  5.0 mmHg MV Vmax:       1.86 m/s MV Vmean:      93.3 cm/s MV Decel Time: 141 msec MV E velocity: 173.00 cm/s MV A velocity: 137.00 cm/s MV E/A ratio:  1.26 Janelle Mediate MD Electronically signed by Janelle Mediate MD Signature Date/Time: 05/03/2024/10:06:14 AM    Final     Scheduled Meds:  apixaban   5 mg Oral BID   carvedilol  12.5 mg Oral BID WC   irbesartan   150 mg Oral Daily   pantoprazole   40 mg Oral Daily    Continuous Infusions:   LOS: 0 days     Veronica Gordon, MD [facility]  If 7PM-7AM, please contact night-coverage [email or web address] 05/03/2024, 4:13 PM

## 2024-05-03 NOTE — Progress Notes (Signed)
  Echocardiogram 2D Echocardiogram has been performed.  Farley Honer, RDCS 05/03/2024, 9:51 AM

## 2024-05-03 NOTE — Consult Note (Addendum)
 Cardiology Consultation   Patient ID: Henry Dougherty MRN: [id number]; DOB: Apr 11, 1961  Admit date: 05/01/2024 Date of Consult: 05/03/2024  PCP:  Margarete Sharps, MD   [facility] Providers Cardiologist:  Dorothye Gathers, MD  Electrophysiologist:  Boyce Byes, MD       Patient Profile: Henry Dougherty is a 63 y.o. male with a hx of atrial fibrillation/flutter, CHF, prior mitral valve repair, PFO closure, MAZE procedure, DM type II, HFrecEF who is being seen 05/03/2024 for the evaluation of labile BP at the request of [facility].  History of Present Illness: Henry Dougherty presented to the ED on 6/18 with persistently labile BP, as high as 200 systolic and as low as 80s systolic with near syncope. Denied noticing positional component to changing BP. Initial labs with creatinine 1.8 (recent baseline closer to 1.4), glucose 213, potassium 5.8. CBC with mild leukocytosis 10.6. Troponin minimally elevated and flat 33->28. EKG with sinus rhythm, HR 88bpm. BNP 208.6. Per admission note, suspicion for overmedication of hypertension with weight loss of 33lbs since starting Mounjaro . Coreg 12.5mg  BID started. Amlodipine , hydrochlorothiazide , metoprolol  held. Formulary alternative to patient's ARB continued this morning.    Of note, reported fluctuant BP on 6/5 with readings as low as 84/52 and as high as 226/108. He was able to seen his PCP on 6/9 but appears that with cardiology office visit pending for 11 days later, no changes made. Patient advised to stop his Amlodipine  by [facility]. On 6/16, there is a telephone encounter from Melissa Maccia, clinical RPH with [facility]. Plasma metanephrines and catecholamines ordered, not yet checked.   Patient evaluated in to see today with his spouse and daughters present.  Patient confirms above history with some additional details today.  He clarifies that he feels that the fluctuating blood pressures have worsened over the last 3 weeks since the  original onset.  Also reports some flushing. Has not found any causative factor though does note an almost prodromal like burning sensation in his esophagus that he calls GERD prior to onset of significantly elevated blood pressure.  Patient interestingly notes that into the last week he found that his symptoms and blood pressures were more fluctuant on Tuesdays, 4 days after he would take his Mounjaro .  He very clearly denies chest pain, shortness of breath, orthopnea, lower leg swelling.  He denies palpitations.  Patient also adds that he has been seeing unusually elevated  Patient confirms that he did present for lab draw after metanephrines and catecholamines were ordered.  I suspect that there may have been a lab error that prevented these from resulting.   Past Medical History:  Diagnosis Date   Allergic rhinitis    Atrial fibrillation with RVR (HCC) 03/2020   CHF (congestive heart failure) (HCC)    EF 45%/ VALVULAR CM   DOE (dyspnea on exertion)    Dyspepsia    Environmental allergies    Hypertension    OA (osteoarthritis)    Obese    Preglaucoma    SOB (shortness of breath)    Uveitis    AS A TEENAGER    Past Surgical History:  Procedure Laterality Date   CARDIAC SURGERY     CARDIOVERSION N/A 04/01/2020   Procedure: CARDIOVERSION;  Surgeon: Harrold Lincoln, MD;  Location: [facility] ENDOSCOPY;  Service: Cardiovascular;  Laterality: N/A;   COLONOSCOPY     PT WAS 47 IN [place]- 2-3 TINY POLYPS   COLONOSCOPY WITH PROPOFOL  N/A 04/21/2022   Procedure: COLONOSCOPY WITH  PROPOFOL ;  Surgeon: Elois Hair, MD;  Location: [facility] ENDOSCOPY;  Service: Gastroenterology;  Laterality: N/A;   KNEE ARTHROSCOPY Right 2005   MITRAL VALVE REPAIR     POLYPECTOMY  04/21/2022   Procedure: POLYPECTOMY;  Surgeon: Elois Hair, MD;  Location: [facility] ENDOSCOPY;  Service: Gastroenterology;;   SHOULDER ARTHROSCOPY Right 2005   TEE WITHOUT CARDIOVERSION N/A 04/01/2016   Procedure: TRANSESOPHAGEAL  ECHOCARDIOGRAM (TEE);  Surgeon: Hugh Madura, MD;  Location: [facility] ENDOSCOPY;  Service: Cardiovascular;  Laterality: N/A;   TEE WITHOUT CARDIOVERSION N/A 04/01/2020   Procedure: TRANSESOPHAGEAL ECHOCARDIOGRAM (TEE);  Surgeon: Harrold Lincoln, MD;  Location: [facility] ENDOSCOPY;  Service: Cardiovascular;  Laterality: N/A;     Home Medications:  Prior to Admission medications   Medication Sig Start Date End Date Taking? Authorizing Provider  acetaminophen  (TYLENOL ) 500 MG tablet Take 1,000 mg by mouth every 6 (six) hours as needed for moderate pain or mild pain.   Yes [provider]  amLODipine  (NORVASC ) 5 MG tablet Take 5 mg by mouth at bedtime.   Yes [provider]  amoxicillin  (AMOXIL ) 500 MG capsule TAKE FOUR CAPSULES BY MOUTH 1 hr BEFORE DENTAL APPT-must have office visit for further refills or obtain from primary care 04/12/24  Yes Hugh Madura, MD  apixaban  (ELIQUIS ) 5 MG TABS tablet TAKE 1 TABLET BY MOUTH TWICE A DAY 11/03/23  Yes Hugh Madura, MD  colchicine 0.6 MG tablet Take 0.6 mg by mouth daily as needed (gout). 10/03/22  Yes [provider]  hydrochlorothiazide  (HYDRODIURIL ) 25 MG tablet TAKE 1 TABLET BY MOUTH EVERY DAY 09/27/23  Yes Hugh Madura, MD  metoprolol  succinate (TOPROL -XL) 50 MG 24 hr tablet TAKE 1 AND 1/2 TABLET BY MOUTH ONCE DAILY IMMEDIATELY FOLLOWING A MEAL 05/05/23  Yes Hugh Madura, MD  rosuvastatin (CRESTOR) 10 MG tablet Take 10 mg by mouth daily.   Yes [provider]  terbinafine  (LAMISIL ) 250 MG tablet Take 1 tablet (250 mg total) by mouth daily. 03/25/24 06/23/24 Yes Semon, Jake L, DPM  Timolol Maleate, Once-Daily, 0.5 % SOLN Apply 1 drop to eye daily. 04/05/24  Yes [provider]  tirzepatide  (MOUNJARO ) 12.5 MG/0.5ML Pen Inject 12.5 mg into the skin once a week. 04/03/24  Yes Wendel Hals, NP  valsartan  (DIOVAN ) 320 MG tablet Take 160 mg by mouth every morning.   Yes [provider]  Accu-Chek Softclix  Lancets lancets USE AS INSTRUCTED TO MONITOR GLUCOSE TWICE DAILY 03/13/24   Wendel Hals, NP  glucose blood (ACCU-CHEK GUIDE TEST) test strip USE TO MONITOR GLUCOSE DAILY AS INSTRUCTED 03/12/24   Wendel Hals, NP    Scheduled Meds:  apixaban   5 mg Oral BID   carvedilol  12.5 mg Oral BID WC   irbesartan   150 mg Oral Daily   pantoprazole   40 mg Oral Daily   Continuous Infusions:  PRN Meds: acetaminophen , metoprolol  tartrate  Allergies:    Allergies  Allergen Reactions   Amiodarone  Other (See Comments)    Blisters and whelps to IV FORM.   Cortisone     Glaucoma HX ,  Triggers increased pressure in eye    Social History:   Social History   Socioeconomic History   Marital status: Married    Spouse name: Not on file   Number of children: 3   Years of education: Not on file   Highest education level: Not on file  Occupational History   Occupation: VP FOR [employer]  Tobacco Use   Smoking status: Never   Smokeless tobacco: Never  Vaping Use   Vaping status: Never Used  Substance and Sexual Activity   Alcohol use: Yes   Drug use: No   Sexual activity: Not on file  Other Topics Concern   Not on file  Social History Narrative   Not on file   Social Drivers of Health   Financial Resource Strain: Not on file  Food Insecurity: No Food Insecurity (05/02/2024)   Hunger Vital Sign    Worried About Running Out of Food in the Last Year: Never true    Ran Out of Food in the Last Year: Never true  Transportation Needs: No Transportation Needs (05/02/2024)   PRAPARE - Administrator, Civil Service (Medical): No    Lack of Transportation (Non-Medical): No  Physical Activity: Not on file  Stress: Not on file  Social Connections: Not on file  Intimate Partner Violence: Not At Risk (05/02/2024)   Humiliation, Afraid, Rape, and Kick questionnaire    Fear of Current or Ex-Partner: No    Emotionally Abused: No    Physically Abused: No    Sexually Abused:  No    Family History:    Family History  Problem Relation Age of Onset   Heart Problems Mother    Atrial fibrillation Mother    Colon cancer Father    Heart Problems Father    Heart attack Maternal Grandfather        died from heart attack in his 27's   Heart attack Paternal Grandfather    Stroke Paternal Grandfather    Pancreatic cancer Neg Hx    Esophageal cancer Neg Hx    Liver cancer Neg Hx    Stomach cancer Neg Hx      ROS:  Please see the history of present illness.   All other ROS reviewed and negative.     Physical Exam/Data: Vitals:   05/03/24 0305 05/03/24 0731 05/03/24 1131 05/03/24 1501  BP: (!) 114/48 (!) 149/79 102/74 (!) 117/49  Pulse: 80 73 95 79  Resp: 19 20 16 16   Temp: 97.9 F (36.6 C) 97.9 F (36.6 C) 98 F (36.7 C) 98.3 F (36.8 C)  TempSrc: Oral Oral Oral Oral  SpO2: 95% 97% 98% 95%  Weight:      Height:        Intake/Output Summary (Last 24 hours) at 05/03/2024 1520 Last data filed at 05/03/2024 0740 Gross per 24 hour  Intake 840 ml  Output --  Net 840 ml      05/02/2024    3:08 PM 04/03/2024    7:54 AM 03/22/2024   10:51 AM  Last 3 Weights  Weight (lbs) 314 lb 14.4 oz 320 lb 325 lb  Weight (kg) 142.838 kg 145.151 kg 147.419 kg     Body mass index is 45.18 kg/m.  General:  Well nourished, well developed, in no acute distress HEENT: normal Neck: no JVD Vascular: No carotid bruits; Distal pulses 2+ bilaterally Cardiac:  normal S1, S2; RRR; no murmur  Lungs:  clear to auscultation bilaterally, no wheezing, rhonchi or rales  Abd: soft, nontender, no hepatomegaly  Ext: no edema Musculoskeletal:  No deformities, BUE and BLE strength normal and equal Skin: warm and dry  Neuro:  CNs 2-12 intact, no focal abnormalities noted Psych:  Normal affect   EKG:  The EKG was personally reviewed and demonstrates: Sinus rhythm with nonspecific T wave inversion of leads III and aVF.  Overall similar appearing EKG to previous Telemetry:  Telemetry  was personally reviewed and demonstrates: Sinus rhythm with some premature atrial contractions.  Isolated and brief run of supraventricular tachycardia.  Relevant CV Studies:  05/03/24 TTE  IMPRESSIONS     1. Left ventricular ejection fraction, by estimation, is 60 to 65%. The  left ventricle has normal function. The left ventricle has no regional  wall motion abnormalities. There is mild left ventricular hypertrophy.  Left ventricular diastolic parameters  are indeterminate.   2. Right ventricular systolic function is normal. The right ventricular  size is normal.   3. Left atrial size was severely dilated.   4. Right atrial size was mildly dilated.   5. Prior MV repair with ring annuloplasty trivial residual MR . The  mitral valve has been repaired/replaced. Trivial mitral valve  regurgitation. No evidence of mitral stenosis.   6. The aortic valve is tricuspid. There is mild calcification of the  aortic valve. Aortic valve regurgitation is mild. Aortic valve sclerosis  is present, with no evidence of aortic valve stenosis.   7. The inferior vena cava is dilated in size with >50% respiratory  variability, suggesting right atrial pressure of 8 mmHg.   8. Agitated saline contrast bubble study was negative, with no evidence  of any interatrial shunt.   FINDINGS   Left Ventricle: Left ventricular ejection fraction, by estimation, is 60  to 65%. The left ventricle has normal function. The left ventricle has no  regional wall motion abnormalities. Definity  contrast agent was given IV  to delineate the left ventricular   endocardial borders. Strain was performed and the global longitudinal  strain is indeterminate. The left ventricular internal cavity size was  normal in size. There is mild left ventricular hypertrophy. Left  ventricular diastolic parameters are  indeterminate.   Right Ventricle: The right ventricular size is normal. No increase in  right ventricular wall  thickness. Right ventricular systolic function is  normal.   Left Atrium: Left atrial size was severely dilated.   Right Atrium: Right atrial size was mildly dilated.   Pericardium: There is no evidence of pericardial effusion.   Mitral Valve: Prior MV repair with ring annuloplasty trivial residual MR.  The mitral valve has been repaired/replaced. Trivial mitral valve  regurgitation. No evidence of mitral valve stenosis. MV peak gradient,  13.8 mmHg. The mean mitral valve gradient  is 5.0 mmHg.   Tricuspid Valve: The tricuspid valve is normal in structure. Tricuspid  valve regurgitation is mild . No evidence of tricuspid stenosis.   Aortic Valve: The aortic valve is tricuspid. There is mild calcification  of the aortic valve. Aortic valve regurgitation is mild. Aortic valve  sclerosis is present, with no evidence of aortic valve stenosis.   Pulmonic Valve: The pulmonic valve was normal in structure. Pulmonic valve  regurgitation is not visualized. No evidence of pulmonic stenosis.   Aorta: The aortic root is normal in size and structure.   Venous: The inferior vena cava is dilated in size with greater than 50%  respiratory variability, suggesting right atrial pressure of 8 mmHg.   IAS/Shunts: No atrial level shunt detected by color flow Doppler. Agitated  saline contrast was given intravenously to evaluate for intracardiac  shunting. Agitated saline contrast bubble study was negative, with no  evidence of any interatrial shunt.   Additional Comments: 3D was performed not requiring image post processing  on an independent workstation and was indeterminate.   Laboratory Data: High Sensitivity Troponin:  No results for input(s): TROPONINIHS in the last 720 hours.   Chemistry Recent Labs  Lab 04/30/24 0810 05/01/24 1525 05/02/24 1525 05/03/24 0258  NA 141 143  --  139  K 5.8* 4.3  --  5.4*  CL 97 101  --  104  CO2 22 28  --  27  GLUCOSE 213* 46*  --  150*  BUN 32*  39*  --  29*  CREATININE 1.83* 1.80* 1.72* 1.65*  CALCIUM  10.5* 10.3  --  8.9  GFRNONAA  --  42* 44* 46*  ANIONGAP  --  14  --  8    Recent Labs  Lab 04/30/24 0810 05/01/24 1525  PROT 7.1 7.0  ALBUMIN 4.8 4.3  AST 17 20  ALT 21 19  ALKPHOS 74 59  BILITOT 0.4 0.3   Lipids No results for input(s): CHOL, TRIG, HDL, LABVLDL, LDLCALC, CHOLHDL in the last 168 hours.  Hematology Recent Labs  Lab 05/01/24 1525 05/02/24 1525  WBC 10.6* 11.6*  RBC 4.96 5.14  HGB 16.2 16.1  HCT 45.6 47.9  MCV 91.9 93.2  MCH 32.7 31.3  MCHC 35.5 33.6  RDW 12.5 12.6  PLT 283 321   Thyroid No results for input(s): TSH, FREET4 in the last 168 hours.  BNP Recent Labs  Lab 05/02/24 1728  BNP 208.6*    DDimer No results for input(s): DDIMER in the last 168 hours.  Radiology/Studies:  ECHOCARDIOGRAM COMPLETE BUBBLE STUDY Result Date: 05/03/2024    ECHOCARDIOGRAM REPORT   Patient Name:   Henry Dougherty [facility] Date of Exam: 05/03/2024 Medical Rec #:  [id number]      Height:       70.0 in Accession #:    [id number]     Weight:       314.9 lb Date of Birth:  22-Aug-1961     BSA:          2.531 m Patient Age:    62 years       BP:           149/79 mmHg Patient Gender: M              HR:           77 bpm. Exam Location:  Inpatient Procedure: 2D Echo, Cardiac Doppler, Color Doppler, Intracardiac Opacification            Agent and Saline Contrast Bubble Study (Both Spectral and Color Flow            Doppler were utilized during procedure). Indications:    Syncope780.2/R55  History:        Patient has prior history of Echocardiogram examinations. PFO                 closure and CHF, Prosthetic annuloplasty ring present in the                 mitral position. Procedure date: 02/2016, Arrythmias:Atrial                 Fibrillation, Signs/Symptoms:Syncope; Risk Factors:Hypertension.  Sonographer:    Kip Peon RDCS Referring Phys: [id number] Arne Langdon  Sonographer Comments: Image acquisition challenging due  to patient body habitus. IMPRESSIONS  1. Left ventricular ejection fraction, by estimation, is 60 to 65%. The left ventricle has normal function. The left ventricle has no regional wall motion abnormalities. There is mild left ventricular hypertrophy. Left ventricular diastolic parameters are indeterminate.  2. Right ventricular systolic function is normal. The  right ventricular size is normal.  3. Left atrial size was severely dilated.  4. Right atrial size was mildly dilated.  5. Prior MV repair with ring annuloplasty trivial residual MR . The mitral valve has been repaired/replaced. Trivial mitral valve regurgitation. No evidence of mitral stenosis.  6. The aortic valve is tricuspid. There is mild calcification of the aortic valve. Aortic valve regurgitation is mild. Aortic valve sclerosis is present, with no evidence of aortic valve stenosis.  7. The inferior vena cava is dilated in size with >50% respiratory variability, suggesting right atrial pressure of 8 mmHg.  8. Agitated saline contrast bubble study was negative, with no evidence of any interatrial shunt. FINDINGS  Left Ventricle: Left ventricular ejection fraction, by estimation, is 60 to 65%. The left ventricle has normal function. The left ventricle has no regional wall motion abnormalities. Definity  contrast agent was given IV to delineate the left ventricular  endocardial borders. Strain was performed and the global longitudinal strain is indeterminate. The left ventricular internal cavity size was normal in size. There is mild left ventricular hypertrophy. Left ventricular diastolic parameters are indeterminate. Right Ventricle: The right ventricular size is normal. No increase in right ventricular wall thickness. Right ventricular systolic function is normal. Left Atrium: Left atrial size was severely dilated. Right Atrium: Right atrial size was mildly dilated. Pericardium: There is no evidence of pericardial effusion. Mitral Valve: Prior MV repair  with ring annuloplasty trivial residual MR. The mitral valve has been repaired/replaced. Trivial mitral valve regurgitation. No evidence of mitral valve stenosis. MV peak gradient, 13.8 mmHg. The mean mitral valve gradient is 5.0 mmHg. Tricuspid Valve: The tricuspid valve is normal in structure. Tricuspid valve regurgitation is mild . No evidence of tricuspid stenosis. Aortic Valve: The aortic valve is tricuspid. There is mild calcification of the aortic valve. Aortic valve regurgitation is mild. Aortic valve sclerosis is present, with no evidence of aortic valve stenosis. Pulmonic Valve: The pulmonic valve was normal in structure. Pulmonic valve regurgitation is not visualized. No evidence of pulmonic stenosis. Aorta: The aortic root is normal in size and structure. Venous: The inferior vena cava is dilated in size with greater than 50% respiratory variability, suggesting right atrial pressure of 8 mmHg. IAS/Shunts: No atrial level shunt detected by color flow Doppler. Agitated saline contrast was given intravenously to evaluate for intracardiac shunting. Agitated saline contrast bubble study was negative, with no evidence of any interatrial shunt. Additional Comments: 3D was performed not requiring image post processing on an independent workstation and was indeterminate.  LEFT VENTRICLE PLAX 2D LVIDd:         5.80 cm   Diastology LVIDs:         3.80 cm   LV e' medial:    12.60 cm/s LV PW:         1.00 cm   LV E/e' medial:  13.7 LV IVS:        1.30 cm   LV e' lateral:   9.57 cm/s LVOT diam:     2.40 cm   LV E/e' lateral: 18.1 LVOT Area:     4.52 cm  RIGHT VENTRICLE             IVC RV Basal diam:  4.20 cm     IVC diam: 2.60 cm RV Mid diam:    3.60 cm RV S prime:     11.80 cm/s LEFT ATRIUM             Index  RIGHT ATRIUM           Index LA diam:        5.40 cm 2.13 cm/m   RA Area:     20.00 cm LA Vol (A2C):   76.6 ml 30.26 ml/m  RA Volume:   48.90 ml  19.32 ml/m LA Vol (A4C):   83.4 ml 32.95 ml/m LA  Biplane Vol: 81.5 ml 32.19 ml/m   AORTA Ao Root diam: 3.40 cm MITRAL VALVE MV Area (PHT): 5.38 cm     SHUNTS MV Peak grad:  13.8 mmHg    Systemic Diam: 2.40 cm MV Mean grad:  5.0 mmHg MV Vmax:       1.86 m/s MV Vmean:      93.3 cm/s MV Decel Time: 141 msec MV E velocity: 173.00 cm/s MV A velocity: 137.00 cm/s MV E/A ratio:  1.26 Janelle Mediate MD Electronically signed by Janelle Mediate MD Signature Date/Time: 05/03/2024/10:06:14 AM    Final    DG Chest Portable 1 View Result Date: 05/01/2024 CLINICAL DATA:  Near-syncope. EXAM: PORTABLE CHEST 1 VIEW COMPARISON:  01/22/2021. FINDINGS: Stable cardiomegaly. Prior cardiac valve replacement and atrial clipping. Minimal right basilar scarring/atelectasis. No focal consolidation, pleural effusion, or pneumothorax. No acute osseous abnormality. IMPRESSION: 1. Minimal right basilar scarring/atelectasis. Otherwise, no acute cardiopulmonary findings. 2. Cardiomegaly. Electronically Signed   By: Mannie Seek M.D.   On: 05/01/2024 16:41     Assessment and Plan:  Labile BP As above, patient with several weeks of increasingly labile blood pressures.  Patient reports burning sensation similar to GERD just prior to episodic and severe hypertension.  Also confirms sensation of flushing with high blood pressures.  Patient can then have significant drop in blood pressure causing near syncope. Etiology at this time unclear.  Differential includes renal artery stenosis, hyperaldosteronism (unlikely with elevated K), carcinoid syndrome. Labs to this point unremarkable for cardiac abnormality.  Echocardiogram is reassuring.  EKG and telemetry without significant electrical abnormalities. Check renal artery duplex Check TSH Check Renin aldosterone (though with caveat of current ARB use) Check plasma metanephrines and 24-hour urine urine catecholamines as well as 5 HIAA 24-hour urine. Discussed CT imaging of adrenal glands but after discussion with Dr. Albert Huff will  defer. Continue with current twice daily Coreg 12.5 mg, irbesartan  150 mg.  Continue to hold amlodipine .  Would discontinue hydrochlorothiazide  given patient's reported elevated intraocular pressure/glaucoma as well as gout exacerbation while taking.  History of atrial fibrillation/flutter No evidence of recurrent arrhythmia this admission on EKG or telemetry.  Continue Eliquis  5 mg twice daily.  Status post mitral valve repair Appropriate mitral valve function seen on echocardiogram this admission.  Risk Assessment/Risk Scores:    CHA2DS2-VASc Score = 3   This indicates a 3.2% annual risk of stroke. The patient's score is based upon: CHF History: 1 HTN History: 1 Diabetes History: 1 Stroke History: 0 Vascular Disease History: 0 Age Score: 0 Gender Score: 0   For questions or updates, please contact [facility] Please consult [email or web address] for contact info under    Signed, Leala Prince, PA-C  05/03/2024 3:20 PM  ADDENDUM:   Patient seen and examined with Leala Prince PA-C.  I personally taken a history, examined the patient, reviewed relevant notes,  laboratory data / imaging studies.  I performed a substantive portion of this encounter and formulated the important aspects of the plan.  I agree with the APP's note, impression, and recommendations; however, I have edited the note to reflect changes  or salient points.   Patient is accompanied by his wife and daughters at bedside. Has been experiencing labile blood pressures for the last 3 weeks. Patient states that his blood pressures can range anywhere from 80 to 90 mmHg to 200+ millimeters of mercury. Prior to this his blood pressures are very well-controlled. Patient has lost approximately 33 pounds or so with lifestyle changes and being on Mounjaro . He takes his Mounjaro  shots every Friday. The dose of Mounjaro  was increased approximately 3 weeks ago as well. He notices majority of the symptoms happening every  Tuesday or so.  His blood pressures could exactly be fine but prior to the onset he feels some epigastric discomfort/dyspepsia, flushing, palpitations, and elevated blood pressures.  The symptoms are pronounced with changing positions.  And spontaneously resolved within an hour.  No prior history of secondary workup specifically for hypertension. No tick bites or recent travels. He takes hydrochlorothiazide  and ARB in the morning and metoprolol  and amlodipine  at night.  Hydrochlorothiazide  was recently discontinued due to increased intraocular pressures as well as gout  PHYSICAL EXAM: Today's Vitals   05/03/24 0752 05/03/24 1131 05/03/24 1501 05/03/24 1758  BP:  102/74 (!) 117/49 (!) 132/43  Pulse:  95 79 87  Resp:  16 16   Temp:  98 F (36.7 C) 98.3 F (36.8 C)   TempSrc:  Oral Oral   SpO2:  98% 95%   Weight:      Height:      PainSc: 0-No pain      Body mass index is 45.18 kg/m.   Net IO Since Admission: 410 mL [05/03/24 1917]  Filed Weights   05/02/24 1508  Weight: (!) 142.8 kg    Physical Exam  Constitutional: No distress.  hemodynamically stable  Neck: No JVD present.  Cardiovascular: Normal rate, regular rhythm, S1 normal and S2 normal. Exam reveals no gallop, no S3 and no S4.  No murmur heard. Pulses:      Dorsalis pedis pulses are 2+ on the right side and 2+ on the left side.       Posterior tibial pulses are 2+ on the right side and 2+ on the left side.  Pulmonary/Chest: Effort normal and breath sounds normal. No stridor. He has no wheezes. He has no rales.  Abdominal: Soft. He exhibits no distension. There is no abdominal tenderness.  Abdominal obesity, not able to appreciate renal bruit  Musculoskeletal:        General: No edema.     Cervical back: Neck supple.  Skin: Skin is warm.    EKG: (personally reviewed by me) 05/03/2024 sinus rhythm, 99 bpm, nonspecific ST-T changes, PACs, without underlying injury pattern  Telemetry: (personally reviewed by  me) Sinus rhythm  Echo  05/03/2024  1. Left ventricular ejection fraction, by estimation, is 60 to 65%. The  left ventricle has normal function. The left ventricle has no regional  wall motion abnormalities. There is mild left ventricular hypertrophy.  Left ventricular diastolic parameters  are indeterminate.   2. Right ventricular systolic function is normal. The right ventricular  size is normal.   3. Left atrial size was severely dilated.   4. Right atrial size was mildly dilated.   5. Prior MV repair with ring annuloplasty trivial residual MR . The  mitral valve has been repaired/replaced. Trivial mitral valve  regurgitation. No evidence of mitral stenosis.   6. The aortic valve is tricuspid. There is mild calcification of the  aortic valve. Aortic valve regurgitation is  mild. Aortic valve sclerosis  is present, with no evidence of aortic valve stenosis.   7. The inferior vena cava is dilated in size with >50% respiratory  variability, suggesting right atrial pressure of 8 mmHg.   8. Agitated saline contrast bubble study was negative, with no evidence  of any interatrial shunt.    Impression & Recommendations: Labile blood pressures Hypertensive urgency Symptoms more pronounced over the last 3 weeks or so. Low likelihood this is due to's secondary hypertension-workup has already been ordered we will fine-tune additional testing Currently takes: Metoprolol  and amlodipine  at night and his ARB and hydrochlorothiazide  in the morning Hydrochlorothiazide  discontinued secondary to increased intraocular pressures as well as gout Given his symptoms of lightheaded and dizziness with changing positions recommend discontinuing amlodipine  for now Currently on carvedilol 12.5 mg p.o. twice daily. Currently on Avapro  150 mg p.o. daily Patient does have sleep apnea and is compliant with therapy Renal duplex ordered Will check renin and aldosterone ratio, of note he is on ARB 24-hour urine  collections for catecholamines, 5H IAA, metanephrines Plasma metanephrines as well Plasma calcium  levels are within normal limits Check TSH Low probability for primary hyperaldo as patient is not hypokalemic. No prior CT of the abdomen for review we will hold off on advanced imaging for now I suspect the degree of the change in his labile blood pressures has to do with the increase in Mounjaro .  He has noted a pattern that the symptoms are more pronounced every 05-18-2024 with dyspepsia and epigastric discomfort along with flushing.  Recommend reducing Mounjaro  to his original dose and to follow-up with the prescribing provider for further assistance or change in therapy No prior history of carcinoid.  Paroxysmal atrial flutter: Currently on carvedilol for rate control strategy, Eliquis  for thromboembolic prophylaxis, does not endorse evidence of bleeding.  And is compliant with CPAP  OSA: Reemphasized importance of device therapy  Status post mitral valve repair: Most recent echo reviewed    Further recommendations to follow as the case evolves.   This note was created using a voice recognition software as a result there may be grammatical errors inadvertently enclosed that do not reflect the nature of this encounter. Every attempt is made to correct such errors.   Olinda Bertrand, DO, [facility]  [address] [place], [place] [address] Pager: [phone] Office: [phone] 05/03/2024

## 2024-05-04 ENCOUNTER — Observation Stay (HOSPITAL_COMMUNITY)

## 2024-05-04 DIAGNOSIS — Z79899 Other long term (current) drug therapy: Secondary | ICD-10-CM | POA: Diagnosis not present

## 2024-05-04 DIAGNOSIS — I480 Paroxysmal atrial fibrillation: Secondary | ICD-10-CM | POA: Diagnosis not present

## 2024-05-04 DIAGNOSIS — I5032 Chronic diastolic (congestive) heart failure: Secondary | ICD-10-CM | POA: Diagnosis present

## 2024-05-04 DIAGNOSIS — Z6841 Body Mass Index (BMI) 40.0 and over, adult: Secondary | ICD-10-CM | POA: Diagnosis not present

## 2024-05-04 DIAGNOSIS — Z7901 Long term (current) use of anticoagulants: Secondary | ICD-10-CM | POA: Diagnosis not present

## 2024-05-04 DIAGNOSIS — G4733 Obstructive sleep apnea (adult) (pediatric): Secondary | ICD-10-CM

## 2024-05-04 DIAGNOSIS — E66813 Obesity, class 3: Secondary | ICD-10-CM | POA: Diagnosis present

## 2024-05-04 DIAGNOSIS — Z7985 Long-term (current) use of injectable non-insulin antidiabetic drugs: Secondary | ICD-10-CM | POA: Diagnosis not present

## 2024-05-04 DIAGNOSIS — I160 Hypertensive urgency: Secondary | ICD-10-CM | POA: Diagnosis present

## 2024-05-04 DIAGNOSIS — E162 Hypoglycemia, unspecified: Secondary | ICD-10-CM | POA: Diagnosis present

## 2024-05-04 DIAGNOSIS — I130 Hypertensive heart and chronic kidney disease with heart failure and stage 1 through stage 4 chronic kidney disease, or unspecified chronic kidney disease: Secondary | ICD-10-CM | POA: Diagnosis present

## 2024-05-04 DIAGNOSIS — I10 Essential (primary) hypertension: Secondary | ICD-10-CM | POA: Diagnosis not present

## 2024-05-04 DIAGNOSIS — E16A2 Hypoglycemia level 2: Secondary | ICD-10-CM | POA: Diagnosis present

## 2024-05-04 DIAGNOSIS — M1990 Unspecified osteoarthritis, unspecified site: Secondary | ICD-10-CM | POA: Diagnosis present

## 2024-05-04 DIAGNOSIS — Z9889 Other specified postprocedural states: Secondary | ICD-10-CM

## 2024-05-04 DIAGNOSIS — R1013 Epigastric pain: Secondary | ICD-10-CM | POA: Diagnosis not present

## 2024-05-04 DIAGNOSIS — D72829 Elevated white blood cell count, unspecified: Secondary | ICD-10-CM | POA: Diagnosis present

## 2024-05-04 DIAGNOSIS — E11649 Type 2 diabetes mellitus with hypoglycemia without coma: Secondary | ICD-10-CM | POA: Diagnosis present

## 2024-05-04 DIAGNOSIS — T383X5A Adverse effect of insulin and oral hypoglycemic [antidiabetic] drugs, initial encounter: Secondary | ICD-10-CM | POA: Diagnosis present

## 2024-05-04 DIAGNOSIS — I4892 Unspecified atrial flutter: Secondary | ICD-10-CM

## 2024-05-04 DIAGNOSIS — N1831 Chronic kidney disease, stage 3a: Secondary | ICD-10-CM | POA: Diagnosis present

## 2024-05-04 DIAGNOSIS — M109 Gout, unspecified: Secondary | ICD-10-CM | POA: Diagnosis present

## 2024-05-04 DIAGNOSIS — K219 Gastro-esophageal reflux disease without esophagitis: Secondary | ICD-10-CM | POA: Diagnosis present

## 2024-05-04 DIAGNOSIS — H409 Unspecified glaucoma: Secondary | ICD-10-CM | POA: Diagnosis present

## 2024-05-04 DIAGNOSIS — E1122 Type 2 diabetes mellitus with diabetic chronic kidney disease: Secondary | ICD-10-CM | POA: Diagnosis present

## 2024-05-04 DIAGNOSIS — Z8249 Family history of ischemic heart disease and other diseases of the circulatory system: Secondary | ICD-10-CM | POA: Diagnosis not present

## 2024-05-04 DIAGNOSIS — K224 Dyskinesia of esophagus: Secondary | ICD-10-CM | POA: Diagnosis not present

## 2024-05-04 DIAGNOSIS — I4891 Unspecified atrial fibrillation: Secondary | ICD-10-CM | POA: Diagnosis present

## 2024-05-04 DIAGNOSIS — E875 Hyperkalemia: Secondary | ICD-10-CM | POA: Diagnosis not present

## 2024-05-04 LAB — COMPREHENSIVE METABOLIC PANEL WITH GFR
ALT: 22 U/L (ref 0–44)
AST: 21 U/L (ref 15–41)
Albumin: 3.4 g/dL — ABNORMAL LOW (ref 3.5–5.0)
Alkaline Phosphatase: 39 U/L (ref 38–126)
Anion gap: 9 (ref 5–15)
BUN: 27 mg/dL — ABNORMAL HIGH (ref 8–23)
CO2: 25 mmol/L (ref 22–32)
Calcium: 8.9 mg/dL (ref 8.9–10.3)
Chloride: 106 mmol/L (ref 98–111)
Creatinine, Ser: 1.61 mg/dL — ABNORMAL HIGH (ref 0.61–1.24)
GFR, Estimated: 47 mL/min — ABNORMAL LOW (ref >60)
Glucose, Bld: 118 mg/dL — ABNORMAL HIGH (ref 70–99)
Potassium: 4 mmol/L (ref 3.5–5.1)
Sodium: 140 mmol/L (ref 135–145)
Total Bilirubin: 0.6 mg/dL (ref 0.0–1.2)
Total Protein: 6.2 g/dL — ABNORMAL LOW (ref 6.5–8.1)

## 2024-05-04 LAB — CBC WITH DIFFERENTIAL/PLATELET
Abs Immature Granulocytes: 0.04 10*3/uL (ref 0.00–0.07)
Basophils Absolute: 0.1 10*3/uL (ref 0.0–0.1)
Basophils Relative: 1 %
Eosinophils Absolute: 0.4 10*3/uL (ref 0.0–0.5)
Eosinophils Relative: 4 %
HCT: 44.6 % (ref 39.0–52.0)
Hemoglobin: 15.6 g/dL (ref 13.0–17.0)
Immature Granulocytes: 1 %
Lymphocytes Relative: 27 %
Lymphs Abs: 2.4 10*3/uL (ref 0.7–4.0)
MCH: 32.3 pg (ref 26.0–34.0)
MCHC: 35 g/dL (ref 30.0–36.0)
MCV: 92.3 fL (ref 80.0–100.0)
Monocytes Absolute: 1 10*3/uL (ref 0.1–1.0)
Monocytes Relative: 11 %
Neutro Abs: 5 10*3/uL (ref 1.7–7.7)
Neutrophils Relative %: 56 %
Platelets: 266 10*3/uL (ref 150–400)
RBC: 4.83 MIL/uL (ref 4.22–5.81)
RDW: 12.6 % (ref 11.5–15.5)
WBC: 8.8 10*3/uL (ref 4.0–10.5)
nRBC: 0 % (ref 0.0–0.2)

## 2024-05-04 NOTE — Progress Notes (Signed)
 PROGRESS NOTE  Henry Dougherty FMW:[id number] DOB: 03-May-1961 DOA: 05/01/2024 PCP: Onita Norleen, MD  HPI/Recap of past 24 hours: Henry Dougherty is a 63 y.o. male with medical history significant of Afib/flutter, Maze procedure, prior mitral valve repair, PFO closure, HFpEF, HTN, obesity, and glaucoma p/w presyncope with labile BP readings which has been ongoing for about 3 months. Patient reports SBPs could go as high as 220s, and then once rechecked may be as low as SBP 80s without any interventions. Had an episode PTA, where his legs gave out, denies LOC or hitting his head. Of note, he retired from a very stressful job at the end of last year, where he traveled for 2 weeks at a time, and has lost 33lbs since starting Mounjaro . Unfortunately, his labile BP episodes have worsened since this time as well.  Of note, patient had followed up with cardiology about adjusting his meds. In the ED, pt was hypertensive. Labs remarkable for Cr 1.8. CXR w/o focal consolidations and pt admitted to medicine.    Today, BP seems to be much better controlled, had just 1 episode of SBP in the 190s with similar symptoms of reflux, otherwise has been much better.  Denies any new complaints.    Assessment/Plan: Principal Problem:   Hypertensive urgency Active Problems:   OSA on CPAP   Class 3 severe obesity due to excess calories with serious comorbidity and body mass index (BMI) of 45.0 to 49.9 in adult   Hx of mitral valve repair   Long term (current) use of anticoagulants   Paroxysmal atrial flutter (HCC)   Labile BP Hypertensive crisis Today BP elevated to about systolics of 240s and dropped down to systolics of 100s Last documented orthostatic vitals appeared normal, although complained about dizziness when standing Unclear etiology Troponin with flat trend, EKG with no acute ST changes Echo with EF of 60 to 65%, no regional wall motion abnormality, left ventricular hypertrophy, prior MV repair with  ring annuloplasty Cardiology consulted, appreciate recs, renal artery duplex with no evidence of renal artery stenosis, plasma metanephrines, 24-hour urine  catecholamines are pending, may need CT imaging of adrenal glands Continue Coreg , ARB for now  CKD stage IIIa Daily BMP  Mild hyperkalemia Gave 1 dose of Lokelma  Daily BMP  Prediabetes A1c 6.3 Continue Mounjaro   History of atrial fibs/flutter Heart rate noted in the 100s Continue Coreg , Eliquis   S/p mitral valve repair Adequate functioning by echo  Morbid obesity Lifestyle modification advised  Estimated body mass index is 45.18 kg/m as calculated from the following:   Height as of this encounter: 5' 10 (1.778 m).   Weight as of this encounter: 142.8 kg.     Code Status: Full  Family Communication: Discussed with daughter and wife at bedside  Disposition Plan: Status is: Observation The patient remains OBS appropriate and will d/c before 2 midnights.      Consultants: Cardiology  Procedures: None  Antimicrobials: None  DVT prophylaxis: Eliquis    Objective: Vitals:   05/04/24 1204 05/04/24 1345 05/04/24 1346 05/04/24 1609  BP: (!) 190/79 (!) 102/47 139/86 (!) 136/50  Pulse: 83 86 86 81  Resp: 20   18  Temp: 98.4 F (36.9 C)   98.6 F (37 C)  TempSrc: Oral   Oral  SpO2: 97%   96%  Weight:      Height:        Intake/Output Summary (Last 24 hours) at 05/04/2024 1712 Last data filed at 05/04/2024 1610 Gross per  24 hour  Intake 600 ml  Output 1675 ml  Net -1075 ml   Filed Weights   05/02/24 1508  Weight: (!) 142.8 kg    Exam: General: NAD, obese Cardiovascular: S1, S2 present Respiratory: CTAB Abdomen: Soft, nontender, nondistended, bowel sounds present Musculoskeletal: No bilateral pedal edema noted Skin: Normal Psychiatry: Normal mood     Data Reviewed: CBC: Recent Labs  Lab 05/01/24 1525 05/02/24 1525 05/04/24 0236  WBC 10.6* 11.6* 8.8  NEUTROABS 7.1  --  5.0  HGB  16.2 16.1 15.6  HCT 45.6 47.9 44.6  MCV 91.9 93.2 92.3  PLT 283 321 266   Basic Metabolic Panel: Recent Labs  Lab 04/30/24 0810 05/01/24 1525 05/02/24 1525 05/03/24 0258 05/04/24 0236  NA 141 143  --  139 140  K 5.8* 4.3  --  5.4* 4.0  CL 97 101  --  104 106  CO2 22 28  --  27 25  GLUCOSE 213* 46*  --  150* 118*  BUN 32* 39*  --  29* 27*  CREATININE 1.83* 1.80* 1.72* 1.65* 1.61*  CALCIUM  10.5* 10.3  --  8.9 8.9   GFR: Estimated Creatinine Clearance: 66.2 mL/min (A) (by C-G formula based on SCr of 1.61 mg/dL (H)). Liver Function Tests: Recent Labs  Lab 04/30/24 0810 05/01/24 1525 05/04/24 0236  AST 17 20 21   ALT 21 19 22   ALKPHOS 74 59 39  BILITOT 0.4 0.3 0.6  PROT 7.1 7.0 6.2*  ALBUMIN 4.8 4.3 3.4*   No results for input(s): LIPASE, AMYLASE in the last 168 hours. No results for input(s): AMMONIA in the last 168 hours. Coagulation Profile: No results for input(s): INR, PROTIME in the last 168 hours. Cardiac Enzymes: No results for input(s): CKTOTAL, CKMB, CKMBINDEX, TROPONINI in the last 168 hours. BNP (last 3 results) No results for input(s): PROBNP in the last 8760 hours. HbA1C: No results for input(s): HGBA1C in the last 72 hours. CBG: Recent Labs  Lab 05/01/24 1630 05/01/24 1726 05/01/24 1854 05/01/24 2008 05/02/24 1415  GLUCAP 78 90 79 82 183*   Lipid Profile: No results for input(s): CHOL, HDL, LDLCALC, TRIG, CHOLHDL, LDLDIRECT in the last 72 hours. Thyroid Function Tests: Recent Labs    05/03/24 1542  TSH 1.488   Anemia Panel: No results for input(s): VITAMINB12, FOLATE, FERRITIN, TIBC, IRON, RETICCTPCT in the last 72 hours. Urine analysis:    Component Value Date/Time   COLORURINE YELLOW 05/01/2024 1526   APPEARANCEUR CLEAR 05/01/2024 1526   LABSPEC 1.017 05/01/2024 1526   PHURINE 5.5 05/01/2024 1526   GLUCOSEU 500 (A) 05/01/2024 1526   HGBUR NEGATIVE 05/01/2024 1526   BILIRUBINUR NEGATIVE  05/01/2024 1526   KETONESUR NEGATIVE 05/01/2024 1526   PROTEINUR NEGATIVE 05/01/2024 1526   NITRITE NEGATIVE 05/01/2024 1526   LEUKOCYTESUR NEGATIVE 05/01/2024 1526   Sepsis Labs: @LABRCNTIP (procalcitonin:4,lacticidven:4)  ) Recent Results (from the past 240 hours)  MRSA Next Gen by PCR, Nasal     Status: None   Collection Time: 05/02/24  3:08 PM   Specimen: Nasal Mucosa; Nasal Swab  Result Value Ref Range Status   MRSA by PCR Next Gen NOT DETECTED NOT DETECTED Final    Comment: (NOTE) The GeneXpert MRSA Assay (FDA approved for NASAL specimens only), is one component of a comprehensive MRSA colonization surveillance program. It is not intended to diagnose MRSA infection nor to guide or monitor treatment for MRSA infections. Test performance is not FDA approved in patients less than 13 years old. Performed  at [facility], [address] [place], [place] [address]       Studies: VAS US  RENAL ARTERY DUPLEX Result Date: 05/04/2024 ABDOMINAL VISCERAL Patient Name:  Henry Dougherty [facility]  Date of Exam:   05/04/2024 Medical Rec #: [id number]       Accession #:    [id number] Date of Birth: July 03, 1961      Patient Gender: M Patient Age:   4 years Exam Location:  [facility] Procedure:      VAS US  RENAL ARTERY DUPLEX Referring Phys: [id number] ARTIST POUCH -------------------------------------------------------------------------------- Indications: Hypertension High Risk Factors: Hypertension. Limitations: Air/bowel gas and obesity. Comparison Study: No prior studies. Performing Technologist: Cordella Collet RVT  Examination Guidelines: A complete evaluation includes B-mode imaging, spectral Doppler, color Doppler, and power Doppler as needed of all accessible portions of each vessel. Bilateral testing is considered an integral part of a complete examination. Limited examinations for reoccurring indications may be performed as noted.  Duplex Findings:  +--------------------+--------+--------+------+--------------+ Mesenteric          PSV cm/sEDV cm/sPlaque   Comments    +--------------------+--------+--------+------+--------------+ Aorta Mid             111      3                         +--------------------+--------+--------+------+--------------+ Celiac Artery Origin                      Not visualized +--------------------+--------+--------+------+--------------+ SMA Origin                                Not visualized +--------------------+--------+--------+------+--------------+    +------------------+--------+--------+-------+ Right Renal ArteryPSV cm/sEDV cm/sComment +------------------+--------+--------+-------+ Origin               20      6            +------------------+--------+--------+-------+ Proximal             21      6            +------------------+--------+--------+-------+ Mid                  35      8            +------------------+--------+--------+-------+ Distal               36      2            +------------------+--------+--------+-------+ +-----------------+--------+--------+-------+ Left Renal ArteryPSV cm/sEDV cm/sComment +-----------------+--------+--------+-------+ Origin              83      12           +-----------------+--------+--------+-------+ Proximal            33      11           +-----------------+--------+--------+-------+ Mid                 40      13           +-----------------+--------+--------+-------+ Distal              46      11           +-----------------+--------+--------+-------+ +------------+--------+--------+----+-----------+--------+--------+----+ Right KidneyPSV cm/sEDV cm/sRI  Left KidneyPSV cm/sEDV cm/sRI   +------------+--------+--------+----+-----------+--------+--------+----+ Upper Pole  17  4       0.75Upper Pole 37      8       0.79  +------------+--------+--------+----+-----------+--------+--------+----+ Mid         25      7       0.        29      6       0.80 +------------+--------+--------+----+-----------+--------+--------+----+ Lower Pole  28      7       0.74Lower Pole 26      6       0.76 +------------+--------+--------+----+-----------+--------+--------+----+ Hilar       42      6       0.84Hilar      53      16      0.71 +------------+--------+--------+----+-----------+--------+--------+----+ +------------------+-----+------------------+-----+ Right Kidney           Left Kidney             +------------------+-----+------------------+-----+ RAR                    RAR                     +------------------+-----+------------------+-----+ RAR (manual)      0.38 RAR (manual)      0.75  +------------------+-----+------------------+-----+ Cortex                 Cortex                  +------------------+-----+------------------+-----+ Cortex thickness       Corex thickness         +------------------+-----+------------------+-----+ Kidney length (cm)11.50Kidney length (cm)11.00 +------------------+-----+------------------+-----+   Summary: Renal:  Right: Normal size right kidney. Normal right Resisitive Index. No        evidence of right renal artery stenosis. Left:  Normal left Resistive Index. Normal size of left kidney. No        evidence of left renal artery stenosis.  *See table(s) above for measurements and observations.     Preliminary     Scheduled Meds:  apixaban   5 mg Oral BID   carvedilol   12.5 mg Oral BID WC   irbesartan   150 mg Oral Daily   pantoprazole   40 mg Oral Daily    Continuous Infusions:   LOS: 0 days     Lebron JINNY Cage, MD [facility]  If 7PM-7AM, please contact night-coverage [email or web address] 05/04/2024, 5:12 PM

## 2024-05-04 NOTE — Progress Notes (Signed)
 Progress Note  Patient Name: Henry Dougherty Date of Encounter: 05/04/2024 [facility] Cardiologist: Oneil Parchment, MD   Interval Summary   No cardiac events overnight. Accompanied by his wife. Blood pressures are very well-controlled with the exception of 1 reading of 190/70  Vital Signs Vitals:   05/03/24 2342 05/04/24 0240 05/04/24 0702 05/04/24 1204  BP: 123/62 (!) 129/54 136/67 (!) 190/79  Pulse: 82  77 83  Resp: 17  18 20   Temp: 97.9 F (36.6 C) 97.8 F (36.6 C) 98.5 F (36.9 C) 98.4 F (36.9 C)  TempSrc: Oral Oral Oral Oral  SpO2: 95% 96% 96% 97%  Weight:      Height:        Intake/Output Summary (Last 24 hours) at 05/04/2024 1228 Last data filed at 05/04/2024 1206 Gross per 24 hour  Intake 480 ml  Output 1425 ml  Net -945 ml      05/02/2024    3:08 PM 04/03/2024    7:54 AM 03/22/2024   10:51 AM  Last 3 Weights  Weight (lbs) 314 lb 14.4 oz 320 lb 325 lb  Weight (kg) 142.838 kg 145.151 kg 147.419 kg       Telemetry/ECG  Sinus rhythm with 1 episode of PSVT- Personally Reviewed  Physical Exam  GEN: No acute distress.   Neck: No JVD Cardiac: RRR, no murmurs, rubs, or gallops.  Respiratory: Clear to auscultation bilaterally. GI: Soft, nontender, non-distended  MS: No edema  Assessment & Plan  Impression & Recommendations: Labile blood pressures Hypertensive urgency Symptoms more pronounced over the last 3 weeks or so (prior to arrival) Low likelihood of secondary hypertension-workup has already been ordered we will fine-tune additional testing Currently takes: Metoprolol  and amlodipine  at night and his ARB and hydrochlorothiazide  in the morning Hydrochlorothiazide  discontinued secondary to increased intraocular pressures as well as gout Given his symptoms of lightheaded and dizziness with changing positions recommend d/c amlodipine  for now Continue carvedilol  12.5 mg p.o. twice daily. Continue Avapro  150 mg p.o. daily Recommended that his blood  pressures be monitored closely-nursing staff notified if they continue to be elevated would consider uptitration of antihypertensive medications. Patient does have sleep apnea and is compliant with therapy Renal duplex - final results pending Will check renin and aldosterone ratio, of note he is on ARB 24-hour urine collections for catecholamines, 5H IAA, metanephrines Plasma metanephrines as well Plasma calcium  levels are within normal limits TSH within normal limits Low probability for primary hyperaldo as patient is not hypokalemic. No prior CT of the abdomen for review we will hold off on advanced imaging for now I suspect the degree of the change in his labile blood pressures has to do with the increase in Mounjaro .  He has noted a pattern that the symptoms are more pronounced every 05/21/2024 with dyspepsia and epigastric discomfort along with flushing.  Recommend reducing Mounjaro  to his original dose and to follow-up with the prescribing provider for further assistance or change in therapy No prior history of carcinoid.   Paroxysmal atrial flutter: Currently on carvedilol  for rate control strategy, Eliquis  for thromboembolic prophylaxis, does not endorse evidence of bleeding.  And is compliant with CPAP   OSA: Reemphasized importance of device therapy   Status post mitral valve repair: Most recent echo reviewed    For questions or updates, please contact [facility] Please consult [email or web address] for contact info under       Signed, Madonna Large, DO, [facility]  [facility]Care  A Division of  [facility] [address], [place], [place] [address]  [place], [place] [address] Pager: [phone] Office: [phone]

## 2024-05-04 NOTE — Progress Notes (Signed)
 Pt had brief burst of SVT up to 150s during renal ultrasound.Patient asymptomatic no complaints. MD notified.

## 2024-05-04 NOTE — Plan of Care (Signed)
   Problem: Education: Goal: Knowledge of General Education information will improve Description Including pain rating scale, medication(s)/side effects and non-pharmacologic comfort measures Outcome: Progressing   Problem: Health Behavior/Discharge Planning: Goal: Ability to manage health-related needs will improve Outcome: Progressing

## 2024-05-04 NOTE — Progress Notes (Signed)
 Renal artery duplex has been completed. Preliminary results can be found in CV Proc through chart review.   05/04/24 10:08 AM Cathlyn Collet RVT

## 2024-05-05 DIAGNOSIS — K219 Gastro-esophageal reflux disease without esophagitis: Secondary | ICD-10-CM

## 2024-05-05 DIAGNOSIS — R1013 Epigastric pain: Secondary | ICD-10-CM | POA: Diagnosis not present

## 2024-05-05 DIAGNOSIS — I160 Hypertensive urgency: Secondary | ICD-10-CM | POA: Diagnosis not present

## 2024-05-05 DIAGNOSIS — Z7901 Long term (current) use of anticoagulants: Secondary | ICD-10-CM | POA: Diagnosis not present

## 2024-05-05 DIAGNOSIS — I4892 Unspecified atrial flutter: Secondary | ICD-10-CM | POA: Diagnosis not present

## 2024-05-05 DIAGNOSIS — G4733 Obstructive sleep apnea (adult) (pediatric): Secondary | ICD-10-CM | POA: Diagnosis not present

## 2024-05-05 DIAGNOSIS — I480 Paroxysmal atrial fibrillation: Secondary | ICD-10-CM

## 2024-05-05 LAB — COMPREHENSIVE METABOLIC PANEL WITH GFR
ALT: 27 U/L (ref 0–44)
AST: 24 U/L (ref 15–41)
Albumin: 3.3 g/dL — ABNORMAL LOW (ref 3.5–5.0)
Alkaline Phosphatase: 40 U/L (ref 38–126)
Anion gap: 9 (ref 5–15)
BUN: 24 mg/dL — ABNORMAL HIGH (ref 8–23)
CO2: 24 mmol/L (ref 22–32)
Calcium: 8.8 mg/dL — ABNORMAL LOW (ref 8.9–10.3)
Chloride: 106 mmol/L (ref 98–111)
Creatinine, Ser: 1.57 mg/dL — ABNORMAL HIGH (ref 0.61–1.24)
GFR, Estimated: 49 mL/min — ABNORMAL LOW (ref >60)
Glucose, Bld: 113 mg/dL — ABNORMAL HIGH (ref 70–99)
Potassium: 3.9 mmol/L (ref 3.5–5.1)
Sodium: 139 mmol/L (ref 135–145)
Total Bilirubin: 0.8 mg/dL (ref 0.0–1.2)
Total Protein: 5.9 g/dL — ABNORMAL LOW (ref 6.5–8.1)

## 2024-05-05 MED ORDER — ISOSORBIDE MONONITRATE ER 30 MG PO TB24
30.0000 mg | ORAL_TABLET | Freq: Every day | ORAL | Status: DC
  Administered 2024-05-05 – 2024-05-06 (×2): 30 mg via ORAL
  Filled 2024-05-05 (×2): qty 1

## 2024-05-05 MED ORDER — PANTOPRAZOLE SODIUM 40 MG PO TBEC
40.0000 mg | DELAYED_RELEASE_TABLET | Freq: Two times a day (BID) | ORAL | Status: DC
  Administered 2024-05-05 – 2024-05-07 (×4): 40 mg via ORAL
  Filled 2024-05-05 (×4): qty 1

## 2024-05-05 NOTE — Progress Notes (Addendum)
 Patient requested repeat bp check before administration of scheduled morning medications. Patient's bp was 147/97 but patient stated feeling burning/reflux sensation lower medial chest area. Pt states that correlates with when his bp increases. Patient did finish coffee around this time.  Upon recheck bp was 228/107. RN attempted bp reading with multiple failed attempts at cuff measuring & trying bil arms with different cuffs.  BP readings 228/107 and 224/106. MD notified.  BP rechecked at 0906 & pt states can tell bp is starting to decrease based on symptoms of reflux burning type feeling in his chest decreasing. BP 199/99.  Bp rechecked at 0935 152/87. Patient c/o no more burning feelings.

## 2024-05-05 NOTE — Progress Notes (Signed)
 Progress Note  Patient Name: Henry Dougherty Date of Encounter: 05/05/2024 [facility] Cardiologist: Oneil Parchment, MD   Interval Summary    No cardiovascular events overnight.   Completed collecting 24-hour urine collection This morning patient was having an episode of epigastric discomfort likely with changing positions in the bed and felt that the blood pressure was rising.  RN notified and blood pressures were checked which were elevated.  The numbers shortly thereafter improved. Patient does not endorse exertional chest pain otherwise. Blood pressure readings have essentially been well-controlled with the exception of this morning. Wife at bedside  Vital Signs Vitals:   05/05/24 0853 05/05/24 0906 05/05/24 0936 05/05/24 1109  BP: (!) 224/106 (!) 199/99 (!) 152/87 135/77  Pulse:  79 81 82  Resp:    17  Temp:    97.6 F (36.4 C)  TempSrc:    Oral  SpO2:    95%  Weight:      Height:        Intake/Output Summary (Last 24 hours) at 05/05/2024 1340 Last data filed at 05/05/2024 0759 Gross per 24 hour  Intake 716 ml  Output 1475 ml  Net -759 ml      05/02/2024    3:08 PM 04/03/2024    7:54 AM 03/22/2024   10:51 AM  Last 3 Weights  Weight (lbs) 314 lb 14.4 oz 320 lb 325 lb  Weight (kg) 142.838 kg 145.151 kg 147.419 kg       Telemetry/ECG  Sinus rhythm with rare PSVT- Personally Reviewed  Physical Exam  GEN: No acute distress.   Neck: No JVD Cardiac: RRR, no murmurs, rubs, or gallops.  Respiratory: Clear to auscultation bilaterally. GI: Soft, nontender, non-distended  MS: No edema  Assessment & Plan  Impression & Recommendations: Labile blood pressures Hypertensive urgency Epigastric discomfort Symptoms more pronounced over the last 3 weeks or so (prior to arrival) Low likelihood of secondary hypertension-workup has already been ordered we will fine-tune additional testing Currently takes: Metoprolol  and amlodipine  at night and his ARB and  hydrochlorothiazide  in the morning Hydrochlorothiazide  discontinued secondary to increased intraocular pressures as well as gout Given his symptoms of lightheaded and dizziness with changing positions recommend d/c amlodipine  for now. Orthostatic vital signs have been negative during this admission. Continue carvedilol  12.5 mg p.o. twice daily. Continue Avapro  150 mg p.o. daily Given the episode earlier this morning as outlined above I discussed with patient, wife and attending physician the concern for possible dyspepsia contributing to this.  Will have GI evaluate the patient for further guidance.  Of note, EKG at this time was sinus and nonischemic. Will start Imdur 30 mg p.o. every afternoon as this will help with blood pressure management as well as esophageal dysmotility. Patient is not on PDE 5 inhibitors for erectile dysfunction or BPH.  He is aware of drug to drug interactions We also discussed undergoing cardiovascular workup as epigastric discomfort can at times be due to CAD.  Patient and wife think this is likely secondary to GI issues would like to complete 1 workup at time.  If GI workup is unremarkable he is willing to undergo cardiac workup prior to discharge.  Last ischemic workup was in 2017. Patient does have sleep apnea and is compliant with therapy Renal duplex -results reviewed, unremarkable Will check renin and aldosterone ratio, of note he is on ARB 24-hour urine collections for catecholamines, 5H IAA, metanephrines Plasma metanephrines as well Plasma calcium  levels are within normal limits TSH within  normal limits Low probability for primary hyperaldo as patient is not hypokalemic. No prior CT of the abdomen for review we will hold off on advanced imaging for now I suspect the degree of the change in his labile blood pressures has to do with the increase in Mounjaro .  He has noted a pattern that the symptoms are more pronounced every 06/07/2024 with dyspepsia and epigastric  discomfort along with flushing.  Recommend reducing Mounjaro  to his original dose and to follow-up with the prescribing provider for further assistance or change in therapy No prior history of carcinoid.   Paroxysmal atrial flutter: Currently on carvedilol  for rate control strategy, Eliquis  for thromboembolic prophylaxis, does not endorse evidence of bleeding.  And is compliant with CPAP   OSA: Reemphasized importance of device therapy   Status post mitral valve repair: Most recent echo reviewed   For questions or updates, please contact [facility] Please consult [email or web address] for contact info under   Medical decision making: High Reviewed the results of the renal duplex. Pendantly reviewed telemetry, EKG, vitals, I's and O's. Patient participated in shared decision making process with regards to further workup in the presence of the patient's wife, attending physician and myself.  GI consult requested.  We also discussed ischemic workup given the recurrent epigastric discomfort. Prescription drug management Cardiology will continue to follow.      Signed, Madonna Michele HAS, [facility] ([facility]) [facility]  A Division of [facility] [address] [place], [place] [address]  [place], [place] [address] Pager: [phone] Office: [phone]

## 2024-05-05 NOTE — Plan of Care (Signed)
   Problem: Education: Goal: Knowledge of General Education information will improve Description Including pain rating scale, medication(s)/side effects and non-pharmacologic comfort measures Outcome: Progressing

## 2024-05-05 NOTE — Progress Notes (Signed)
 PROGRESS NOTE  [name] FMW:[id number] DOB: [birth year] DOA: 05/01/2024 PCP: Onita Norleen, MD  HPI/Recap of past 24 hours: Henry Dougherty is a 63 y.o. male with medical history significant of Afib/flutter, Maze procedure, prior mitral valve repair, PFO closure, HFpEF, HTN, obesity, and glaucoma p/w presyncope with labile BP readings which has been ongoing for about 3 months. Patient reports SBPs could go as high as 220s, and then once rechecked may be as low as SBP 80s without any interventions. Had an episode PTA, where his legs gave out, denies LOC or hitting his head. Of note, he retired from a very stressful job at the end of last year, where he traveled for 2 weeks at a time, and has lost 33lbs since starting Mounjaro . Unfortunately, his labile BP episodes have worsened since this time as well.  Of note, patient had followed up with cardiology about adjusting his meds. In the ED, pt was hypertensive. Labs remarkable for Cr 1.8. CXR w/o focal consolidations and pt admitted to medicine.    Today, still having episodes of severe reflux/dyspepsia and noted BP elevation during these episodes, quickly resolves.  Discussed extensively with patient's, cardiology, and wife at bedside.  Denies any other new complaints    Assessment/Plan: Principal Problem:   Hypertensive urgency Active Problems:   OSA on CPAP   Class 3 severe obesity due to excess calories with serious comorbidity and body mass index (BMI) of 45.0 to 49.9 in adult   Hx of mitral valve repair   Long term (current) use of anticoagulants   Paroxysmal atrial flutter (HCC)   Epigastric discomfort   Gastroesophageal reflux disease without esophagitis   Labile BP Hypertensive crisis Last documented orthostatic vitals appeared normal, although complained about dizziness when standing Unclear etiology Troponin with flat trend, EKG with no acute ST changes Echo with EF of 60 to 65%, no regional wall motion abnormality, left  ventricular hypertrophy, prior MV repair with ring annuloplasty Cardiology consulted, appreciate recs, renal artery duplex with no evidence of renal artery stenosis, plasma metanephrines, 24-hour urine  catecholamines are pending, may need CT imaging of adrenal glands and further cardiac workup if symptoms persist Continue Coreg , ARB for now, added Imdur  Possible GERD/dyspepsia Started on PPI GI consulted for further recommendations given persistent epigastric pain/reflux symptoms Recommend increasing dose of PPI and monitoring, may require EGD Increase PPI to twice daily  CKD stage IIIa Daily BMP  Mild hyperkalemia Gave 1 dose of Lokelma  Daily BMP  Prediabetes A1c 6.3 Plan to DC on lower dose of Mounjaro  versus switching to another GLP  History of atrial fibs/flutter Heart rate noted in the 100s Continue Coreg , Eliquis   S/p mitral valve repair Adequate functioning by echo  Morbid obesity Lifestyle modification advised  Estimated body mass index is 45.18 kg/m as calculated from the following:   Height as of this encounter: 5' 10 (1.778 m).   Weight as of this encounter: 142.8 kg.     Code Status: Full  Family Communication: Discussed with wife at bedside  Disposition Plan: Status is: Inpatient The patient remains OBS appropriate and will d/c before 2 midnights.      Consultants: Cardiology  Procedures: None  Antimicrobials: None  DVT prophylaxis: Eliquis    Objective: Vitals:   05/05/24 0906 05/05/24 0936 05/05/24 1109 05/05/24 1511  BP: (!) 199/99 (!) 152/87 135/77 (!) 150/92  Pulse: 79 81 82 90  Resp:   17 18  Temp:   97.6 F (36.4 C) 98.3 F (  36.8 C)  TempSrc:   Oral Oral  SpO2:   95% 95%  Weight:      Height:        Intake/Output Summary (Last 24 hours) at 05/05/2024 1731 Last data filed at 05/05/2024 0759 Gross per 24 hour  Intake 476 ml  Output 1225 ml  Net -749 ml   Filed Weights   05/02/24 1508  Weight: (!) 142.8 kg     Exam: General: NAD, obese Cardiovascular: S1, S2 present Respiratory: CTAB Abdomen: Soft, nontender, nondistended, bowel sounds present Musculoskeletal: No bilateral pedal edema noted Skin: Normal Psychiatry: Normal mood     Data Reviewed: CBC: Recent Labs  Lab 05/01/24 1525 05/02/24 1525 05/04/24 0236  WBC 10.6* 11.6* 8.8  NEUTROABS 7.1  --  5.0  HGB 16.2 16.1 15.6  HCT 45.6 47.9 44.6  MCV 91.9 93.2 92.3  PLT 283 321 266   Basic Metabolic Panel: Recent Labs  Lab 04/30/24 0810 05/01/24 1525 05/02/24 1525 05/03/24 0258 05/04/24 0236 05/05/24 0234  NA 141 143  --  139 140 139  K 5.8* 4.3  --  5.4* 4.0 3.9  CL 97 101  --  104 106 106  CO2 22 28  --  27 25 24   GLUCOSE 213* 46*  --  150* 118* 113*  BUN 32* 39*  --  29* 27* 24*  CREATININE 1.83* 1.80* 1.72* 1.65* 1.61* 1.57*  CALCIUM  10.5* 10.3  --  8.9 8.9 8.8*   GFR: Estimated Creatinine Clearance: 67.8 mL/min (A) (by C-G formula based on SCr of 1.57 mg/dL (H)). Liver Function Tests: Recent Labs  Lab 04/30/24 0810 05/01/24 1525 05/04/24 0236 05/05/24 0234  AST 17 20 21 24   ALT 21 19 22 27   ALKPHOS 74 59 39 40  BILITOT 0.4 0.3 0.6 0.8  PROT 7.1 7.0 6.2* 5.9*  ALBUMIN 4.8 4.3 3.4* 3.3*   No results for input(s): LIPASE, AMYLASE in the last 168 hours. No results for input(s): AMMONIA in the last 168 hours. Coagulation Profile: No results for input(s): INR, PROTIME in the last 168 hours. Cardiac Enzymes: No results for input(s): CKTOTAL, CKMB, CKMBINDEX, TROPONINI in the last 168 hours. BNP (last 3 results) No results for input(s): PROBNP in the last 8760 hours. HbA1C: No results for input(s): HGBA1C in the last 72 hours. CBG: Recent Labs  Lab 05/01/24 1630 05/01/24 1726 05/01/24 1854 05/01/24 2008 05/02/24 1415  GLUCAP 78 90 79 82 183*   Lipid Profile: No results for input(s): CHOL, HDL, LDLCALC, TRIG, CHOLHDL, LDLDIRECT in the last 72 hours. Thyroid  Function Tests: Recent Labs    05/03/24 1542  TSH 1.488   Anemia Panel: No results for input(s): VITAMINB12, FOLATE, FERRITIN, TIBC, IRON, RETICCTPCT in the last 72 hours. Urine analysis:    Component Value Date/Time   COLORURINE YELLOW 05/01/2024 1526   APPEARANCEUR CLEAR 05/01/2024 1526   LABSPEC 1.017 05/01/2024 1526   PHURINE 5.5 05/01/2024 1526   GLUCOSEU 500 (A) 05/01/2024 1526   HGBUR NEGATIVE 05/01/2024 1526   BILIRUBINUR NEGATIVE 05/01/2024 1526   KETONESUR NEGATIVE 05/01/2024 1526   PROTEINUR NEGATIVE 05/01/2024 1526   NITRITE NEGATIVE 05/01/2024 1526   LEUKOCYTESUR NEGATIVE 05/01/2024 1526   Sepsis Labs: @LABRCNTIP (procalcitonin:4,lacticidven:4)  ) Recent Results (from the past 240 hours)  MRSA Next Gen by PCR, Nasal     Status: None   Collection Time: 05/02/24  3:08 PM   Specimen: Nasal Mucosa; Nasal Swab  Result Value Ref Range Status   MRSA by PCR  Next Gen NOT DETECTED NOT DETECTED Final    Comment: (NOTE) The GeneXpert MRSA Assay (FDA approved for NASAL specimens only), is one component of a comprehensive MRSA colonization surveillance program. It is not intended to diagnose MRSA infection nor to guide or monitor treatment for MRSA infections. Test performance is not FDA approved in patients less than 66 years old. Performed at [facility], [address] [place], [place] [address]       Studies: No results found.   Scheduled Meds:  apixaban   5 mg Oral BID   carvedilol   12.5 mg Oral BID WC   irbesartan   150 mg Oral Daily   isosorbide mononitrate  30 mg Oral Q2200   pantoprazole   40 mg Oral BID    Continuous Infusions:   LOS: 1 day     Caretha Rumbaugh J Ginna Schuur, MD [facility]  If 7PM-7AM, please contact night-coverage [email or web address] 05/05/2024, 5:31 PM

## 2024-05-05 NOTE — Consult Note (Addendum)
 Consultation  Referring Provider:     Dr. Lansing Primary Care Physician:  Onita Rush, MD Primary Gastroenterologist:     Dr. Stacia    Reason for Consultation:     Dyspepsia, GERD         HPI:   Henry Dougherty is a 63 y.o. male with a history of A-fib/flutter (on Eliquis ), CHFpEF, mitral valve repair, PFO closure, Maze procedure, diabetes, obesity, admitted 05/03/2024 with episodic hypertensive urgency with systolic BP in the low to mid 799d.    He reports these episodes have been going on for the last 4-5 weeks or so prior to admission.  Reports having epigastric/subxiphoid discomfort and heartburn sensation, which coincides closely with significant spike in blood pressure.  Does have associated flushing with these episodes.  Symptoms typically last for about an hour, then symptoms abate and blood pressure falls to the 80s/50s.  BP eventually stabilizes to the 140s/70s and GI symptoms do not recur.  No dysphagia or odynophagia.  Feels otherwise well in between these episodes.  From a potential reflux standpoint, feels that symptoms are exacerbated by coffee, tomato-based sauce.  Has used Tums and baking soda/vinegar, but no prior trial of PPI, H2 blockers.  Previously on Ozempic, and more recently was started on Mounjaro .  Symptoms seem to be more pronounced after increasing to 12.5 mg.  Interestingly, while he injects on Fridays, symptoms are predominantly on Tuesdays.  He had another episode this morning with significant BP elevation to 224/106 with associated subxiphoid discomfort.  BP slowly improved and symptoms resolved.  Was evaluated by Cardiology and no associated EKG changes.  No prior EGD.  Did have a colonoscopy in 04/2022 notable for 5 subcentimeter adenomas and sessile serrated polyps, left-sided diverticulosis, and recommended repeat in 3 years.  Family history notable for father with colon cancer.  No known family history of esophageal or stomach cancer, liver disease,  adrenal cancer.  Past Medical History:  Diagnosis Date   Allergic rhinitis    Atrial fibrillation with RVR (HCC) 03/2020   CHF (congestive heart failure) (HCC)    EF 45%/ VALVULAR CM   DOE (dyspnea on exertion)    Dyspepsia    Environmental allergies    Hypertension    OA (osteoarthritis)    Obese    OSA on CPAP 05/03/2024   Preglaucoma    SOB (shortness of breath)    Uveitis    AS A TEENAGER    Past Surgical History:  Procedure Laterality Date   CARDIAC SURGERY     CARDIOVERSION N/A 04/01/2020   Procedure: CARDIOVERSION;  Surgeon: Barbaraann Darryle Ned, MD;  Location[facility] ENDOSCOPY;  Service: Cardiovascular;  Laterality: N/A;   COLONOSCOPY     PT WAS 47 IN [place]- 2-3 TINY POLYPS   COLONOSCOPY WITH PROPOFOL  N/A 04/21/2022   Procedure: COLONOSCOPY WITH PROPOFOL ;  Surgeon: Stacia Glendia BRAVO, MD;  Location: [facility] ENDOSCOPY;  Service: Gastroenterology;  Laterality: N/A;   KNEE ARTHROSCOPY Right 2005   MITRAL VALVE REPAIR     POLYPECTOMY  04/21/2022   Procedure: POLYPECTOMY;  Surgeon: Stacia Glendia BRAVO, MD;  Location: [facility] ENDOSCOPY;  Service: Gastroenterology;;   SHOULDER ARTHROSCOPY Right 2005   TEE WITHOUT CARDIOVERSION N/A 04/01/2016   Procedure: TRANSESOPHAGEAL ECHOCARDIOGRAM (TEE);  Surgeon: Oneil JAYSON Parchment, MD;  Location: [facility] ENDOSCOPY;  Service: Cardiovascular;  Laterality: N/A;   TEE WITHOUT CARDIOVERSION N/A 04/01/2020   Procedure: TRANSESOPHAGEAL ECHOCARDIOGRAM (TEE);  Surgeon: Barbaraann Darryle Ned, MD;  Location: [facility] ENDOSCOPY;  Service:  Cardiovascular;  Laterality: N/A;    Family History  Problem Relation Age of Onset   Heart Problems Mother    Atrial fibrillation Mother    Colon cancer Father    Heart Problems Father    Heart attack Maternal Grandfather        died from heart attack in his 21's   Heart attack Paternal Grandfather    Stroke Paternal Grandfather    Pancreatic cancer Neg Hx    Esophageal cancer Neg Hx    Liver cancer Neg Hx    Stomach cancer Neg Hx       Social History   Tobacco Use   Smoking status: Never   Smokeless tobacco: Never  Vaping Use   Vaping status: Never Used  Substance Use Topics   Alcohol use: Yes   Drug use: No    Prior to Admission medications   Medication Sig Start Date End Date Taking? Authorizing Provider  acetaminophen  (TYLENOL ) 500 MG tablet Take 1,000 mg by mouth every 6 (six) hours as needed for moderate pain or mild pain.   Yes [provider]  amLODipine  (NORVASC ) 5 MG tablet Take 5 mg by mouth at bedtime.   Yes [provider]  amoxicillin  (AMOXIL ) 500 MG capsule TAKE FOUR CAPSULES BY MOUTH 1 hr BEFORE DENTAL APPT-must have office visit for further refills or obtain from primary care 04/12/24  Yes Jeffrie Oneil BROCKS, MD  apixaban  (ELIQUIS ) 5 MG TABS tablet TAKE 1 TABLET BY MOUTH TWICE A DAY 11/03/23  Yes Jeffrie Oneil BROCKS, MD  colchicine 0.6 MG tablet Take 0.6 mg by mouth daily as needed (gout). 10/03/22  Yes [provider]  hydrochlorothiazide  (HYDRODIURIL ) 25 MG tablet TAKE 1 TABLET BY MOUTH EVERY DAY 09/27/23  Yes Jeffrie Oneil BROCKS, MD  metoprolol  succinate (TOPROL -XL) 50 MG 24 hr tablet TAKE 1 AND 1/2 TABLET BY MOUTH ONCE DAILY IMMEDIATELY FOLLOWING A MEAL 05/05/23  Yes Jeffrie Oneil BROCKS, MD  rosuvastatin (CRESTOR) 10 MG tablet Take 10 mg by mouth daily.   Yes [provider]  terbinafine  (LAMISIL ) 250 MG tablet Take 1 tablet (250 mg total) by mouth daily. 03/25/24 06/23/24 Yes Semon, Jake L, DPM  Timolol Maleate, Once-Daily, 0.5 % SOLN Apply 1 drop to eye daily. 04/05/24  Yes [provider]  tirzepatide  (MOUNJARO ) 12.5 MG/0.5ML Pen Inject 12.5 mg into the skin once a week. 04/03/24  Yes Therisa Benton PARAS, NP  valsartan  (DIOVAN ) 320 MG tablet Take 160 mg by mouth every morning.   Yes [provider]  Accu-Chek Softclix Lancets lancets USE AS INSTRUCTED TO MONITOR GLUCOSE TWICE DAILY 03/13/24   Therisa Benton PARAS, NP  glucose blood (ACCU-CHEK GUIDE TEST) test strip USE  TO MONITOR GLUCOSE DAILY AS INSTRUCTED 03/12/24   Therisa Benton PARAS, NP    Current Facility-Administered Medications  Medication Dose Route Frequency Provider Last Rate Last Admin   acetaminophen  (TYLENOL ) tablet 1,000 mg  1,000 mg Oral Q6H PRN Elnor Savant A, DO       apixaban  (ELIQUIS ) tablet 5 mg  5 mg Oral BID Goldston, Scott, MD   5 mg at 05/05/24 0840   carvedilol  (COREG ) tablet 12.5 mg  12.5 mg Oral BID WC Georgina Basket, MD   12.5 mg at 05/05/24 0641   irbesartan  (AVAPRO ) tablet 150 mg  150 mg Oral Daily Ezenduka, Nkeiruka J, MD   150 mg at 05/05/24 0840   isosorbide mononitrate (IMDUR) 24 hr tablet 30 mg  30 mg Oral Q2200 Tolia, Sunit,  DO       pantoprazole  (PROTONIX ) EC tablet 40 mg  40 mg Oral Daily Ezenduka, Nkeiruka J, MD   40 mg at 05/05/24 0840    Allergies as of 05/01/2024 - Review Complete 05/01/2024  Allergen Reaction Noted   Amiodarone  Other (See Comments) 03/29/2016   Cortisone  03/30/2020     Review of Systems:    As per HPI, otherwise negative    Physical Exam:  Vital signs in last 24 hours: Temp:  [97.6 F (36.4 C)-98.6 F (37 C)] 97.6 F (36.4 C) (06/22 1109) Pulse Rate:  [64-85] 82 (06/22 1109) Resp:  [16-18] 17 (06/22 1109) BP: (100-228)/(46-107) 135/77 (06/22 1109) SpO2:  [94 %-96 %] 95 % (06/22 1109) Last BM Date : 05/03/24 General:   Pleasant male in NAD Lungs:  Respirations even and unlabored. Lungs clear to auscultation bilaterally.   No wheezes, crackles, or rhonchi.  Heart:  Regular rate and rhythm; no MRG Abdomen:  Soft, nondistended, nontender. Normal bowel sounds.  Neurologic:  Alert and  oriented x4;  grossly normal neurologically. Skin:  Intact without significant lesions or rashes. Psych:  Alert and cooperative. Normal affect.  LAB RESULTS: Recent Labs    05/02/24 1525 05/04/24 0236  WBC 11.6* 8.8  HGB 16.1 15.6  HCT 47.9 44.6  PLT 321 266   BMET Recent Labs    05/03/24 0258 05/04/24 0236 05/05/24 0234  NA 139 140 139   K 5.4* 4.0 3.9  CL 104 106 106  CO2 27 25 24   GLUCOSE 150* 118* 113*  BUN 29* 27* 24*  CREATININE 1.65* 1.61* 1.57*  CALCIUM  8.9 8.9 8.8*   LFT Recent Labs    05/05/24 0234  PROT 5.9*  ALBUMIN 3.3*  AST 24  ALT 27  ALKPHOS 40  BILITOT 0.8   PT/INR No results for input(s): LABPROT, INR in the last 72 hours.  STUDIES: VAS US  RENAL ARTERY DUPLEX Result Date: 05/05/2024 ABDOMINAL VISCERAL Patient Name:  Henry Dougherty [facility]  Date of Exam:   05/04/2024 Medical Rec #: [id number]       Accession #:    [id number] Date of Birth: 1961/07/06      Patient Gender: M Patient Age:   7 years Exam Location:  [facility] Procedure:      VAS US  RENAL ARTERY DUPLEX Referring Phys: [id number] ARTIST POUCH -------------------------------------------------------------------------------- Indications: Hypertension High Risk Factors: Hypertension. Limitations: Air/bowel gas and obesity. Comparison Study: No prior studies. Performing Technologist: Cordella Collet RVT  Examination Guidelines: A complete evaluation includes B-mode imaging, spectral Doppler, color Doppler, and power Doppler as needed of all accessible portions of each vessel. Bilateral testing is considered an integral part of a complete examination. Limited examinations for reoccurring indications may be performed as noted.  Duplex Findings: +--------------------+--------+--------+------+--------------+ Mesenteric          PSV cm/sEDV cm/sPlaque   Comments    +--------------------+--------+--------+------+--------------+ Aorta Mid             111      3                         +--------------------+--------+--------+------+--------------+ Celiac Artery Origin                      Not visualized +--------------------+--------+--------+------+--------------+ SMA Origin  Not visualized +--------------------+--------+--------+------+--------------+    +------------------+--------+--------+-------+  Right Renal ArteryPSV cm/sEDV cm/sComment +------------------+--------+--------+-------+ Origin               20      6            +------------------+--------+--------+-------+ Proximal             21      6            +------------------+--------+--------+-------+ Mid                  35      8            +------------------+--------+--------+-------+ Distal               36      2            +------------------+--------+--------+-------+ +-----------------+--------+--------+-------+ Left Renal ArteryPSV cm/sEDV cm/sComment +-----------------+--------+--------+-------+ Origin              83      12           +-----------------+--------+--------+-------+ Proximal            33      11           +-----------------+--------+--------+-------+ Mid                 40      13           +-----------------+--------+--------+-------+ Distal              46      11           +-----------------+--------+--------+-------+ +------------+--------+--------+----+-----------+--------+--------+----+ Right KidneyPSV cm/sEDV cm/sRI  Left KidneyPSV cm/sEDV cm/sRI   +------------+--------+--------+----+-----------+--------+--------+----+ Upper Pole  17      4       0.75Upper Pole 37      8       0.79 +------------+--------+--------+----+-----------+--------+--------+----+ Mid         25      7       0.        29      6       0.80 +------------+--------+--------+----+-----------+--------+--------+----+ Lower Pole  28      7       0.74Lower Pole 26      6       0.76 +------------+--------+--------+----+-----------+--------+--------+----+ Hilar       42      6       0.84Hilar      53      16      0.71 +------------+--------+--------+----+-----------+--------+--------+----+ +------------------+-----+------------------+-----+ Right Kidney           Left Kidney             +------------------+-----+------------------+-----+ RAR                     RAR                     +------------------+-----+------------------+-----+ RAR (manual)      0.38 RAR (manual)      0.75  +------------------+-----+------------------+-----+ Cortex                 Cortex                  +------------------+-----+------------------+-----+ Cortex thickness       Corex thickness         +------------------+-----+------------------+-----+ Kidney length (cm)11.50Kidney length (cm)11.00 +------------------+-----+------------------+-----+  Summary: Renal:  Right: Normal size right kidney. Normal right Resisitive Index. No        evidence of right renal artery stenosis. Left:  Normal left Resistive Index. Normal size of left kidney. No        evidence of left renal artery stenosis.  *See table(s) above for measurements and observations.  Diagnosing physician: Penne Colorado MD  Electronically signed by Penne Colorado MD on 05/05/2024 at 10:31:21 AM.    Final      PREVIOUS ENDOSCOPIES:            -04/2022: Colonoscopy: 5 mm cecal polyp, 2 mm ascending colon polyp, 2 flat transverse colon polyps 5-7 mm (path: Sessile serrated polyps and tubular adenomas), 8 mm splenic flexure adenoma, left-sided diverticulosis.  Repeat in 3 years.   Impression / Plan:   1) Dyspepsia 2) Hypertensive urgency with labile blood pressures Interesting presentation of episodic dyspepsia and mild reflux symptoms which are temporally correlated with significant, acute rise in BP with systolic blood pressures in the 200s.  Symptoms tend to abate as HTN resolves.  Not entirely sure if this is truly correlated or 2 separate true-true unrelated entities. - Increase Protonix  to 40 mg p.o. twice daily for diagnostic and therapeutic intent - Discussed role/utility of endoscopic evaluation.  Could evaluate for erosive esophagitis, LES laxity, hiatal hernia, along with PUD, gastritis.  He would like to trial high-dose PPI first.  Otherwise no urgent/emergent indication for endoscopy,  so feel this is an appropriate strategy - BP management per Cardiology service - GLP-1 inhibitor can be associated with worsening reflux.  He seems to have done better with lower dose of his Mounjaro .  Curious if the GLP-1 (or the higher dose?) could induced a change in catecholamines, contributing to his sxs. Per notes, planning on dose reduction. Can consider holding altogether for the time being and treat with alternate DM medications.  - Workup for possible pheochromocytoma with 24-hour metanephrine/catecholamine, 5-HIAA.  Depending on results, may benefit from adrenal imaging - Interestingly, Mounjaro  peak can be as late as 72 hours.  Possible that he is having reflux exacerbation as Mounjaro  peaks, leading to labile blood pressure.  As above, treating with high-dose PPI and can do EGD if no appreciable change in symptoms  3) Paroxysmal atrial flutter - Currently on Eliquis .  If planning on EGD, will need to hold Eliquis  - Management per Cardiology service   Dr. Federico will assume his ongoing inpatient GI care starting tomorrow morning.   Henry Flatter, DO, [facility]    LOS: 1 day   Henry LULLA Dougherty  05/05/2024, 3:05 PM

## 2024-05-06 DIAGNOSIS — Z7901 Long term (current) use of anticoagulants: Secondary | ICD-10-CM | POA: Diagnosis not present

## 2024-05-06 DIAGNOSIS — I10 Essential (primary) hypertension: Secondary | ICD-10-CM

## 2024-05-06 DIAGNOSIS — G4733 Obstructive sleep apnea (adult) (pediatric): Secondary | ICD-10-CM | POA: Diagnosis not present

## 2024-05-06 DIAGNOSIS — I4892 Unspecified atrial flutter: Secondary | ICD-10-CM | POA: Diagnosis not present

## 2024-05-06 DIAGNOSIS — I160 Hypertensive urgency: Secondary | ICD-10-CM | POA: Diagnosis not present

## 2024-05-06 LAB — BASIC METABOLIC PANEL WITH GFR
Anion gap: 8 (ref 5–15)
BUN: 21 mg/dL (ref 8–23)
CO2: 23 mmol/L (ref 22–32)
Calcium: 8.7 mg/dL — ABNORMAL LOW (ref 8.9–10.3)
Chloride: 107 mmol/L (ref 98–111)
Creatinine, Ser: 1.47 mg/dL — ABNORMAL HIGH (ref 0.61–1.24)
GFR, Estimated: 53 mL/min — ABNORMAL LOW (ref >60)
Glucose, Bld: 117 mg/dL — ABNORMAL HIGH (ref 70–99)
Potassium: 4 mmol/L (ref 3.5–5.1)
Sodium: 138 mmol/L (ref 135–145)

## 2024-05-06 MED ORDER — ALUM & MAG HYDROXIDE-SIMETH 200-200-20 MG/5ML PO SUSP: 15.0000 mL | ORAL | Status: DC | PRN

## 2024-05-06 NOTE — Progress Notes (Signed)
 PROGRESS NOTE  Henry Dougherty FMW:[id number] DOB: 03-04-1961 DOA: 05/01/2024 PCP: Onita Norleen, MD  HPI/Recap of past 24 hours: Henry Dougherty is a 63 y.o. male with medical history significant of Afib/flutter, Maze procedure, prior mitral valve repair, PFO closure, HFpEF, HTN, obesity, and glaucoma p/w presyncope with labile BP readings which has been ongoing for about 3 months. Patient reports SBPs could go as high as 220s, and then once rechecked may be as low as SBP 80s without any interventions. Had an episode PTA, where his legs gave out, denies LOC or hitting his head. Of note, he retired from a very stressful job at the end of last year, where he traveled for 2 weeks at a time, and has lost 33lbs since starting Mounjaro . Unfortunately, his labile BP episodes have worsened since this time as well.  Of note, patient had followed up with cardiology about adjusting his meds. In the ED, pt was hypertensive. Labs remarkable for Cr 1.8. CXR w/o focal consolidations and pt admitted to medicine.    Today, patient denies any new complaints.  Still having his episodic high blood pressure and reflux symptoms.    Assessment/Plan: Principal Problem:   Hypertensive urgency Active Problems:   OSA on CPAP   Class 3 severe obesity due to excess calories with serious comorbidity and body mass index (BMI) of 45.0 to 49.9 in adult   Hx of mitral valve repair   Long term (current) use of anticoagulants   Paroxysmal atrial flutter (HCC)   Epigastric discomfort   Gastroesophageal reflux disease without esophagitis   Labile BP Hypertensive crisis Last documented orthostatic vitals appeared normal, although complained about dizziness when standing Unclear etiology, maybe related to ??Mounjaro  Troponin with flat trend, EKG with no acute ST changes Echo with EF of 60 to 65%, no regional wall motion abnormality, left ventricular hypertrophy, prior MV repair with ring annuloplasty Cardiology consulted,  appreciate recs, renal artery duplex with no evidence of renal artery stenosis, plasma metanephrines, 24-hour urine  catecholamines are pending, may need CT imaging of adrenal glands and further cardiac workup if symptoms persist Continue Coreg , ARB for now, added Imdur  Possible GERD/dyspepsia Started on PPI GI consulted for further recommendations given persistent epigastric pain/reflux symptoms Recommend increasing dose of PPI and monitoring, may require EGD Increase PPI to twice daily  CKD stage IIIa Daily BMP  Mild hyperkalemia Gave 1 dose of Lokelma  Daily BMP  Prediabetes A1c 6.3 Plan to DC Mounjaro  and switching to another GLP  History of atrial fibs/flutter Heart rate noted in the 100s Continue Coreg , Eliquis   S/p mitral valve repair Adequate functioning by echo  Morbid obesity Lifestyle modification advised  Estimated body mass index is 45.18 kg/m as calculated from the following:   Height as of this encounter: 5' 10 (1.778 m).   Weight as of this encounter: 142.8 kg.     Code Status: Full  Family Communication: Discussed with wife at bedside on 6/22  Disposition Plan: Status is: Inpatient    Consultants: Cardiology GI  Procedures: None  Antimicrobials: None  DVT prophylaxis: Eliquis    Objective: Vitals:   05/06/24 1330 05/06/24 1400 05/06/24 1430 05/06/24 1500  BP: 126/62 (!) 110/52 (!) 113/45 (!) 120/52  Pulse: 83     Resp:      Temp:      TempSrc:      SpO2: 97%     Weight:      Height:        Intake/Output Summary (  Last 24 hours) at 05/06/2024 1619 Last data filed at 05/06/2024 0700 Gross per 24 hour  Intake 720 ml  Output --  Net 720 ml   Filed Weights   05/02/24 1508  Weight: (!) 142.8 kg    Exam: General: NAD, obese Cardiovascular: S1, S2 present Respiratory: CTAB Abdomen: Soft, nontender, nondistended, bowel sounds present Musculoskeletal: No bilateral pedal edema noted Skin: Normal Psychiatry: Normal mood      Data Reviewed: CBC: Recent Labs  Lab 05/01/24 1525 05/02/24 1525 05/04/24 0236  WBC 10.6* 11.6* 8.8  NEUTROABS 7.1  --  5.0  HGB 16.2 16.1 15.6  HCT 45.6 47.9 44.6  MCV 91.9 93.2 92.3  PLT 283 321 266   Basic Metabolic Panel: Recent Labs  Lab 05/01/24 1525 05/02/24 1525 05/03/24 0258 05/04/24 0236 05/05/24 0234 05/06/24 0230  NA 143  --  139 140 139 138  K 4.3  --  5.4* 4.0 3.9 4.0  CL 101  --  104 106 106 107  CO2 28  --  27 25 24 23   GLUCOSE 46*  --  150* 118* 113* 117*  BUN 39*  --  29* 27* 24* 21  CREATININE 1.80* 1.72* 1.65* 1.61* 1.57* 1.47*  CALCIUM  10.3  --  8.9 8.9 8.8* 8.7*   GFR: Estimated Creatinine Clearance: 72.5 mL/min (A) (by C-G formula based on SCr of 1.47 mg/dL (H)). Liver Function Tests: Recent Labs  Lab 04/30/24 0810 05/01/24 1525 05/04/24 0236 05/05/24 0234  AST 17 20 21 24   ALT 21 19 22 27   ALKPHOS 74 59 39 40  BILITOT 0.4 0.3 0.6 0.8  PROT 7.1 7.0 6.2* 5.9*  ALBUMIN 4.8 4.3 3.4* 3.3*   No results for input(s): LIPASE, AMYLASE in the last 168 hours. No results for input(s): AMMONIA in the last 168 hours. Coagulation Profile: No results for input(s): INR, PROTIME in the last 168 hours. Cardiac Enzymes: No results for input(s): CKTOTAL, CKMB, CKMBINDEX, TROPONINI in the last 168 hours. BNP (last 3 results) No results for input(s): PROBNP in the last 8760 hours. HbA1C: No results for input(s): HGBA1C in the last 72 hours. CBG: Recent Labs  Lab 05/01/24 1630 05/01/24 1726 05/01/24 1854 05/01/24 2008 05/02/24 1415  GLUCAP 78 90 79 82 183*   Lipid Profile: No results for input(s): CHOL, HDL, LDLCALC, TRIG, CHOLHDL, LDLDIRECT in the last 72 hours. Thyroid Function Tests: No results for input(s): TSH, T4TOTAL, FREET4, T3FREE, THYROIDAB in the last 72 hours.  Anemia Panel: No results for input(s): VITAMINB12, FOLATE, FERRITIN, TIBC, IRON, RETICCTPCT in the last 72  hours. Urine analysis:    Component Value Date/Time   COLORURINE YELLOW 05/01/2024 1526   APPEARANCEUR CLEAR 05/01/2024 1526   LABSPEC 1.017 05/01/2024 1526   PHURINE 5.5 05/01/2024 1526   GLUCOSEU 500 (A) 05/01/2024 1526   HGBUR NEGATIVE 05/01/2024 1526   BILIRUBINUR NEGATIVE 05/01/2024 1526   KETONESUR NEGATIVE 05/01/2024 1526   PROTEINUR NEGATIVE 05/01/2024 1526   NITRITE NEGATIVE 05/01/2024 1526   LEUKOCYTESUR NEGATIVE 05/01/2024 1526   Sepsis Labs: @LABRCNTIP (procalcitonin:4,lacticidven:4)  ) Recent Results (from the past 240 hours)  MRSA Next Gen by PCR, Nasal     Status: None   Collection Time: 05/02/24  3:08 PM   Specimen: Nasal Mucosa; Nasal Swab  Result Value Ref Range Status   MRSA by PCR Next Gen NOT DETECTED NOT DETECTED Final    Comment: (NOTE) The GeneXpert MRSA Assay (FDA approved for NASAL specimens only), is one component of a comprehensive MRSA  colonization surveillance program. It is not intended to diagnose MRSA infection nor to guide or monitor treatment for MRSA infections. Test performance is not FDA approved in patients less than 25 years old. Performed at [facility], [address] [place], [place] [address]       Studies: No results found.   Scheduled Meds:  apixaban   5 mg Oral BID   carvedilol   12.5 mg Oral BID WC   irbesartan   150 mg Oral Daily   isosorbide mononitrate  30 mg Oral Q2200   pantoprazole   40 mg Oral BID    Continuous Infusions:   LOS: 2 days     Fabrice Dyal J Coralie Stanke, MD [facility]  If 7PM-7AM, please contact night-coverage [email or web address] 05/06/2024, 4:19 PM

## 2024-05-06 NOTE — Progress Notes (Signed)
 Progress Note  Patient Name: Henry Dougherty Date of Encounter: 05/06/2024 [facility] Cardiologist: Oneil Parchment, MD   Interval Summary   Sitting in chair. Family in room.  His labile blood pressures have gotten worse since increasing the dose of Mounjaro .  Vital Signs Vitals:   05/06/24 0033 05/06/24 0341 05/06/24 0625 05/06/24 0749  BP: (!) 111/59 (!) 109/51 (!) 115/56 134/68  Pulse: 82 77 84 82  Resp: 16 18 16 18   Temp: 97.7 F (36.5 C) 97.6 F (36.4 C) 98.2 F (36.8 C) 98.2 F (36.8 C)  TempSrc: Oral Oral Oral Oral  SpO2: 92% 93% 96% 93%  Weight:      Height:        Intake/Output Summary (Last 24 hours) at 05/06/2024 0826 Last data filed at 05/06/2024 0700 Gross per 24 hour  Intake 720 ml  Output --  Net 720 ml      05/02/2024    3:08 PM 04/03/2024    7:54 AM 03/22/2024   10:51 AM  Last 3 Weights  Weight (lbs) 314 lb 14.4 oz 320 lb 325 lb  Weight (kg) 142.838 kg 145.151 kg 147.419 kg      Telemetry/ECG  Brief periods of atrial tachycardia noted, PACs, no atrial fibrillation- Personally Reviewed  Physical Exam  GEN: No acute distress.   Neck: No JVD Cardiac: RRR, no murmurs, rubs, or gallops.  Respiratory: Clear to auscultation bilaterally. GI: Soft, nontender, non-distended  MS: No edema  Assessment & Plan   63 year old with atrial fibrillation flutter status post maze procedure during mitral valve repair with PFO closure, heart failure with preserved ejection fraction or diastolic dysfunction, hypertension obesity with extremely labile blood pressure readings as high as 220 down as low as 80 with episodes where he feels as though his legs give out but no loss of consciousness.  Retired lost 33 pounds on Mounjaro  but his labile blood pressure episodes have worsened.  Was also having episodes of severe reflux and dyspepsia which seem to quickly resolve (could this be a vagal trigger causing extreme hypotension)  Labile hypertension - Orthostatics  checked were normal although he was dizzy when he stood - Echo with normal ejection fraction 60 to 65% with LVH and prior MV repair was stable. -No evidence of renal artery stenosis -Pheochromocytoma workup with plasma metanephrines 24-hour urine catecholamines are currently pending -CT scan of adrenal glands/abdomen should be considered. -Current medications are   carvedilol  12.5 mg once a day Avapro  150 mg once a day Isosorbide 30 mg once a day (started last night) At home was taking amlodipine  5 mg at bedtime but this was recently stopped  If blood pressures bottom out more, we can pull off the isosorbide.  Headache noted.  Weight loss - Mounjaro -excellent weight loss on GLP of over 30 pounds.  I wonder if there could be any correlation with this new medication and markedly labile blood pressures?  For instance, his episode of severe reflux or GI type symptoms which are quite common with GLP's may be a vagal trigger which is causing marked vasodilatation and hypotension?  Do not know for sure.  Chronic kidney disease stage IIIa - BMP has been stable  History of atrial fibrillation and flutter - Heart rates controlled.  On carvedilol  as well as Eliquis  for anticoagulation.  Post maze procedure  Mitral valve repair - Good function on echo.  Morbid obesity - BMI 45, GLP-1 -Previously on Ozempic now on Mounjaro .  Symptoms of GERD have increased  after increasing medication dosage to 12.5.  Interestingly while he injects on Fridays, symptoms are predominantly on Tuesdays (in review of GI consultation there was a note that Mounjaro  P can be as late as 72 hours). -Protonix  has been increased to 40 mg twice a day by GI.  He would like to trial this first before proceeding with endoscopy. -In review, it is noted that GLP-1 receptor agonist can have an impact on blood pressure both hypertensive-acute affect, hypotensive-chronic effect, and a vagal response where GLP-1 receptor activation  particularly in the central nervous system can influence vagal nerve activity with recent findings suggesting central GLP-1 receptor activation increases vagal nerve activity.  This may contribute to observed effects on heart rate and potential blood pressure fluctuations.  Hypertensive urgency has been noted in some individuals, subsequent hypotension/vagal response has been noted following a hypertensive response triggered by a vagal reflex.  -My suggestion would be to discontinue GLP-1 agonist.  This seems to correlate with his symptoms not only GERD but also hypertensive urgency/hypotension/vagal response.  Family is in agreement.  Lets keep him here 1 more day to make sure that things are stable.    For questions or updates, please contact [facility] Please consult [email or web address] for contact info under       Signed, Oneil Parchment, MD

## 2024-05-06 NOTE — Progress Notes (Signed)
    Gastroenterology Inpatient Follow Up    Subjective: Blood pressure was doing well until he had another episode of heartburn this afternoon.   Objective: Vital signs in last 24 hours: Temp:  [97.6 F (36.4 C)-98.2 F (36.8 C)] 98.2 F (36.8 C) (06/23 1641) Pulse Rate:  [72-84] 83 (06/23 1330) Resp:  [16-20] 20 (06/23 1641) BP: (109-205)/(45-88) 133/50 (06/23 1641) SpO2:  [92 %-97 %] 97 % (06/23 1641) Last BM Date : 05/03/24  Intake/Output from previous day: 06/22 0701 - 06/23 0700 In: 840 [P.O.:840] Out: 275 [Urine:275] Intake/Output this shift: No intake/output data recorded.  General appearance: alert and cooperative Resp: no increased WOB Cardio: regular rate GI: non-distended  Lab Results: Recent Labs    05/04/24 0236  WBC 8.8  HGB 15.6  HCT 44.6  PLT 266   BMET Recent Labs    05/04/24 0236 05/05/24 0234 05/06/24 0230  NA 140 139 138  K 4.0 3.9 4.0  CL 106 106 107  CO2 25 24 23   GLUCOSE 118* 113* 117*  BUN 27* 24* 21  CREATININE 1.61* 1.57* 1.47*  CALCIUM  8.9 8.8* 8.7*   LFT Recent Labs    05/05/24 0234  PROT 5.9*  ALBUMIN 3.3*  AST 24  ALT 27  ALKPHOS 40  BILITOT 0.8   PT/INR No results for input(s): LABPROT, INR in the last 72 hours. Hepatitis Panel No results for input(s): HEPBSAG, HCVAB, HEPAIGM, HEPBIGM in the last 72 hours. C-Diff No results for input(s): CDIFFTOX in the last 72 hours.  Studies/Results: No results found.  Medications: I have reviewed the patient's current medications. Scheduled:  apixaban   5 mg Oral BID   carvedilol   12.5 mg Oral BID WC   irbesartan   150 mg Oral Daily   isosorbide mononitrate  30 mg Oral Q2200   pantoprazole   40 mg Oral BID   Continuous: PRN:acetaminophen , alum & mag hydroxide-simeth  Assessment/Plan: 63 y.o. male with a history of A-fib/flutter (on Eliquis ), CHFpEF, mitral valve repair, PFO closure, Maze procedure, diabetes, obesity, admitted 05/03/2024 with episodic  hypertensive urgency with systolic BP in the low to mid 799d. We were consulted for epigastric discomfort/heartburn.   Patient appears to be doing slightly better on PPI therapy.  Will add on some Maalox as needed in case this heartburn sensation does not abate by itself.  Patient would still like to wait on an EGD to see if the PPI therapy is enough to be able to keep his symptoms under control,  which is reasonable in the setting of his continued labile blood pressure.   Cardiology is following to further adjust his blood pressure medications.   24-hour urine metanephrines are pending for workup of pheochromocytoma.   LOS: 2 days   Rosario JAYSON Kidney 05/06/2024, 4:49 PM

## 2024-05-06 NOTE — Progress Notes (Addendum)
 Pt complaining of minor left foot pain, tenderness, & erythema. Pt stated that it feels similar to the gout flare-up that he has previously experienced. Attempted to notify Segars, MD. See new orders.  Lonell LITTIE Lyme, RN

## 2024-05-06 NOTE — Plan of Care (Signed)

## 2024-05-07 ENCOUNTER — Ambulatory Visit: Payer: Self-pay | Admitting: Physician Assistant

## 2024-05-07 ENCOUNTER — Other Ambulatory Visit (HOSPITAL_COMMUNITY): Payer: Self-pay

## 2024-05-07 DIAGNOSIS — I160 Hypertensive urgency: Secondary | ICD-10-CM | POA: Diagnosis not present

## 2024-05-07 DIAGNOSIS — Z7901 Long term (current) use of anticoagulants: Secondary | ICD-10-CM | POA: Diagnosis not present

## 2024-05-07 DIAGNOSIS — G4733 Obstructive sleep apnea (adult) (pediatric): Secondary | ICD-10-CM | POA: Diagnosis not present

## 2024-05-07 DIAGNOSIS — I4892 Unspecified atrial flutter: Secondary | ICD-10-CM | POA: Diagnosis not present

## 2024-05-07 LAB — BASIC METABOLIC PANEL WITH GFR
Anion gap: 10 (ref 5–15)
BUN: 21 mg/dL (ref 8–23)
CO2: 24 mmol/L (ref 22–32)
Calcium: 8.8 mg/dL — ABNORMAL LOW (ref 8.9–10.3)
Chloride: 104 mmol/L (ref 98–111)
Creatinine, Ser: 1.42 mg/dL — ABNORMAL HIGH (ref 0.61–1.24)
GFR, Estimated: 55 mL/min — ABNORMAL LOW (ref >60)
Glucose, Bld: 117 mg/dL — ABNORMAL HIGH (ref 70–99)
Potassium: 4.1 mmol/L (ref 3.5–5.1)
Sodium: 138 mmol/L (ref 135–145)

## 2024-05-07 LAB — ALDOSTERONE + RENIN ACTIVITY W/ RATIO
ALDO / PRA Ratio: 0.8 (ref 0.0–30.0)
Aldosterone: 1.8 ng/dL (ref 0.0–30.0)
PRA LC/MS/MS: 2.15 ng/mL/h (ref 0.167–5.380)

## 2024-05-07 LAB — URIC ACID: Uric Acid, Serum: 9.3 mg/dL — ABNORMAL HIGH (ref 3.7–8.6)

## 2024-05-07 MED ORDER — PANTOPRAZOLE SODIUM 40 MG PO TBEC
40.0000 mg | DELAYED_RELEASE_TABLET | Freq: Two times a day (BID) | ORAL | 0 refills | Status: DC
  Filled 2024-05-07: qty 60, 30d supply, fill #0

## 2024-05-07 MED ORDER — METOPROLOL SUCCINATE ER 100 MG PO TB24: 100.0000 mg | ORAL_TABLET | Freq: Every day | ORAL | Status: DC

## 2024-05-07 MED ORDER — ISOSORBIDE MONONITRATE ER 30 MG PO TB24
30.0000 mg | ORAL_TABLET | Freq: Every day | ORAL | 0 refills | Status: DC
  Filled 2024-05-07: qty 30, 30d supply, fill #0

## 2024-05-07 MED ORDER — METOPROLOL SUCCINATE ER 50 MG PO TB24
100.0000 mg | ORAL_TABLET | Freq: Every day | ORAL | 0 refills | Status: DC
  Filled 2024-05-07: qty 30, 15d supply, fill #0

## 2024-05-07 MED ORDER — COLCHICINE 0.6 MG PO TABS
0.6000 mg | ORAL_TABLET | Freq: Every day | ORAL | Status: DC
  Administered 2024-05-07: 0.6 mg via ORAL
  Filled 2024-05-07: qty 1

## 2024-05-07 NOTE — Progress Notes (Signed)
  Progress Note  Patient Name: Henry Dougherty Date of Encounter: 05/07/2024 [facility] Cardiologist: Oneil Parchment, MD   Interval Summary   See below  Vital Signs Vitals:   05/06/24 2345 05/07/24 0358 05/07/24 0621 05/07/24 0730  BP: 130/74 (!) 146/63 (!) 145/68 130/65  Pulse: 94 84    Resp: 15 20  20   Temp: 98.3 F (36.8 C) 97.9 F (36.6 C)  97.8 F (36.6 C)  TempSrc: Oral Oral  Oral  SpO2: 92% 97%  97%  Weight:      Height:        Intake/Output Summary (Last 24 hours) at 05/07/2024 0955 Last data filed at 05/07/2024 9247 Gross per 24 hour  Intake 720 ml  Output --  Net 720 ml      05/02/2024    3:08 PM 04/03/2024    7:54 AM 03/22/2024   10:51 AM  Last 3 Weights  Weight (lbs) 314 lb 14.4 oz 320 lb 325 lb  Weight (kg) 142.838 kg 145.151 kg 147.419 kg      Telemetry/ECG  PAC's PAT, NSR otherwise - Personally Reviewed  Physical Exam  GEN: No acute distress.   Neck: No JVD Cardiac: RRR, no murmurs, rubs, or gallops.  Respiratory: Clear to auscultation bilaterally. GI: Soft, nontender, non-distended  MS: No edema, +foot pain  Assessment & Plan   Gouty flare Labile hypertension Recent GLP-1 use GERD Prior Maze procedure atrial fibrillation with recurrent flutter Mitral valve repair  Discussion today with he and his wife.  He developed significant foot pain compatible with his prior gouty flareup.  He has taken colchicine at home before.  They were concerned that this may be related to his new start carvedilol  which she has had a few doses.  I was able to discuss with our pharmacy team in 2 heart and they will come by to discuss the issue further.  Family was concerned that this was increasing serum uric acid levels hence the cause of his gouty flair.  Related then the definitely hydrochlorothiazide  can be associated with gout.  They were aware of this.  He is no longer on HCTZ.  Once again wild swings in blood pressure are the reason he came in to the  hospital.  I believe that these may be associated with GLP-1 Mounjaro .  See prior note for full details.  Stopping Mounjaro .  Would avoid GLP-1's in the future.  Pending discharge.  If after discussion with pharmacy team, family is still adamant that he stop the carvedilol , I agree that Toprol -XL 100 mg is reasonable.  We will set up follow-up with our hypertension clinic to monitor closely.       For questions or updates, please contact [facility] Please consult [email or web address] for contact info under       Signed, Oneil Parchment, MD

## 2024-05-07 NOTE — Plan of Care (Signed)

## 2024-05-07 NOTE — Plan of Care (Signed)
  Problem: Education: Goal: Knowledge of General Education information will improve Description: Including pain rating scale, medication(s)/side effects and non-pharmacologic comfort measures Outcome: Adequate for Discharge   Problem: Health Behavior/Discharge Planning: Goal: Ability to manage health-related needs will improve Outcome: Adequate for Discharge   Problem: Clinical Measurements: Goal: Ability to maintain clinical measurements within normal limits will improve Outcome: Adequate for Discharge Goal: Will remain free from infection Outcome: Adequate for Discharge Goal: Diagnostic test results will improve Outcome: Adequate for Discharge Goal: Respiratory complications will improve Outcome: Adequate for Discharge Goal: Cardiovascular complication will be avoided Outcome: Adequate for Discharge   Problem: Activity: Goal: Risk for activity intolerance will decrease 05/07/2024 1428 by Charlann Lucie CROME, RN Outcome: Adequate for Discharge 05/07/2024 0953 by Charlann Lucie CROME, RN Outcome: Progressing   Problem: Nutrition: Goal: Adequate nutrition will be maintained 05/07/2024 1428 by Charlann Lucie CROME, RN Outcome: Adequate for Discharge 05/07/2024 0953 by Charlann Lucie CROME, RN Outcome: Progressing   Problem: Coping: Goal: Level of anxiety will decrease 05/07/2024 1428 by Charlann Lucie CROME, RN Outcome: Adequate for Discharge 05/07/2024 0953 by Charlann Lucie CROME, RN Outcome: Progressing   Problem: Elimination: Goal: Will not experience complications related to bowel motility 05/07/2024 1428 by Charlann Lucie CROME, RN Outcome: Adequate for Discharge 05/07/2024 0953 by Charlann Lucie CROME, RN Outcome: Progressing Goal: Will not experience complications related to urinary retention 05/07/2024 1428 by Charlann Lucie CROME, RN Outcome: Adequate for Discharge 05/07/2024 0953 by Charlann Lucie CROME, RN Outcome: Progressing   Problem: Pain Managment: Goal: General experience of comfort  will improve and/or be controlled 05/07/2024 1428 by Charlann Lucie CROME, RN Outcome: Adequate for Discharge 05/07/2024 0953 by Charlann Lucie CROME, RN Outcome: Progressing   Problem: Safety: Goal: Ability to remain free from injury will improve 05/07/2024 1428 by Charlann Lucie CROME, RN Outcome: Adequate for Discharge 05/07/2024 0953 by Charlann Lucie CROME, RN Outcome: Progressing   Problem: Skin Integrity: Goal: Risk for impaired skin integrity will decrease Outcome: Adequate for Discharge   Problem: Education: Goal: Ability to demonstrate management of disease process will improve Outcome: Adequate for Discharge Goal: Ability to verbalize understanding of medication therapies will improve Outcome: Adequate for Discharge   Problem: Activity: Goal: Capacity to carry out activities will improve Outcome: Adequate for Discharge   Problem: Cardiac: Goal: Ability to achieve and maintain adequate cardiopulmonary perfusion will improve Outcome: Adequate for Discharge   Problem: Education: Goal: Knowledge of disease or condition will improve Outcome: Adequate for Discharge Goal: Understanding of medication regimen will improve Outcome: Adequate for Discharge   Problem: Activity: Goal: Ability to tolerate increased activity will improve Outcome: Adequate for Discharge   Problem: Cardiac: Goal: Ability to achieve and maintain adequate cardiopulmonary perfusion will improve Outcome: Adequate for Discharge   Problem: Health Behavior/Discharge Planning: Goal: Ability to safely manage health-related needs after discharge will improve Outcome: Adequate for Discharge

## 2024-05-07 NOTE — Discharge Summary (Signed)
 Physician Discharge Summary   Patient: Henry Dougherty MRN: [id number] DOB: 09-14-1961  Admit date:     05/01/2024  Discharge date: 05/07/24  Discharge Physician: Lebron JINNY Cage   PCP: Onita Norleen, MD   Recommendations at discharge:   Follow-up with PCP in 1 week Follow-up with hypertension clinic/cardiology as scheduled  Discharge Diagnoses: Principal Problem:   Hypertensive urgency Active Problems:   OSA on CPAP   Class 3 severe obesity due to excess calories with serious comorbidity and body mass index (BMI) of 45.0 to 49.9 in adult   Hx of mitral valve repair   Long term (current) use of anticoagulants   Paroxysmal atrial flutter (HCC)   Epigastric discomfort   Gastroesophageal reflux disease without esophagitis    Hospital Course: Henry Dougherty is a 63 y.o. male with medical history significant of Afib/flutter, Maze procedure, prior mitral valve repair, PFO closure, HFpEF, HTN, obesity, and glaucoma p/w presyncope with labile BP readings which has been ongoing for about 3 months. Patient reports SBPs could go as high as 220s, and then once rechecked may be as low as SBP 80s without any interventions. Had an episode PTA, where his legs gave out, denies LOC or hitting his head. Of note, he retired from a very stressful job at the end of last year, where he traveled for 2 weeks at a time, and has lost 33lbs since starting Mounjaro . Unfortunately, his labile BP episodes have worsened since this time as well.  Of note, patient had followed up with cardiology about adjusting his meds. In the ED, pt was hypertensive. Labs remarkable for Cr 1.8. CXR w/o focal consolidations and pt admitted to medicine.     Today, patient reports feeling better overall, reports more better BP control as well as improving reflux symptoms.  Discussed extensively with patient about discharge plans and follow-ups.  Patient and wife verbalized understanding.  Due to the recent gout flare, patient and  wife adamant about not resuming Coreg  as they are convinced it is the reason for his gout flare and increase in uric acid.  Patient and wife would like to switch back to Toprol  at an increased dose of 100 mg and are very aware of the fact that his BP may become uncontrolled.  Patient advised to reach out to PCP/hypertension clinic if BP becomes uncontrolled.  Patient does a good job at keeping good BP readings.     Assessment and Plan:  Labile BP Hypertensive crisis Last documented orthostatic vitals appeared normal, although complained about dizziness when standing Unclear etiology, maybe related to ??Mounjaro  Troponin with flat trend, EKG with no acute ST changes Echo with EF of 60 to 65%, no regional wall motion abnormality, left ventricular hypertrophy, prior MV repair with ring annuloplasty Cardiology consulted, appreciate recs, renal artery duplex with no evidence of renal artery stenosis, plasma metanephrines, 24-hour urine  catecholamines are pending, may need CT imaging of adrenal glands and further cardiac workup if symptoms persist Stop Coreg , switch to PTA metoprolol  100 mg, continue valsartan , started Imdur,   Possible GERD/dyspepsia Started on PPI GI consulted for further recommendations given persistent epigastric pain/reflux symptoms Recommend increasing dose of PPI twice daily and monitoring, may require EGD Outpatient follow-up with GI  Possible gout flare Right ankle pain, swelling Reports feels like his gout flare Uric acid 9.3, start colchicine Patient refused further workup/imaging and eager to be discharged   CKD stage IIIa Stable   Mild hyperkalemia   Prediabetes A1c 6.3 Discontinue Mounjaro   Follow-up with PCP   History of atrial fibs/flutter Heart rate controlled Continue metoprolol , Eliquis    S/p mitral valve repair Adequate functioning by echo   Morbid obesity Lifestyle modification advised       Consultants: Cardiology, GI Procedures  performed: None Disposition: Home Diet recommendation:  Cardiac and Carb modified diet   DISCHARGE MEDICATION: Allergies as of 05/07/2024       Reactions   Amiodarone  Other (See Comments)   Blisters and whelps to IV FORM.   Cortisone    Glaucoma HX ,  Triggers increased pressure in eye        Medication List     PAUSE taking these medications    amLODipine  5 MG tablet Wait to take this until your doctor or other care provider tells you to start again. Commonly known as: NORVASC  Take 5 mg by mouth at bedtime.       STOP taking these medications    hydrochlorothiazide  25 MG tablet Commonly known as: HYDRODIURIL    tirzepatide  12.5 MG/0.5ML Pen Commonly known as: MOUNJARO        TAKE these medications    Accu-Chek Guide Test test strip Generic drug: glucose blood USE TO MONITOR GLUCOSE DAILY AS INSTRUCTED   Accu-Chek Softclix Lancets lancets USE AS INSTRUCTED TO MONITOR GLUCOSE TWICE DAILY   acetaminophen  500 MG tablet Commonly known as: TYLENOL  Take 1,000 mg by mouth every 6 (six) hours as needed for moderate pain or mild pain.   amoxicillin  500 MG capsule Commonly known as: AMOXIL  TAKE FOUR CAPSULES BY MOUTH 1 hr BEFORE DENTAL APPT-must have office visit for further refills or obtain from primary care   colchicine 0.6 MG tablet Take 0.6 mg by mouth daily as needed (gout).   Eliquis  5 MG Tabs tablet Generic drug: apixaban  TAKE 1 TABLET BY MOUTH TWICE A DAY   isosorbide mononitrate 30 MG 24 hr tablet Commonly known as: IMDUR Take 1 tablet (30 mg total) by mouth daily at 10 pm.   metoprolol  succinate 50 MG 24 hr tablet Commonly known as: TOPROL -XL Take 2 tablets (100 mg total) by mouth daily. What changed:  how much to take how to take this when to take this additional instructions   pantoprazole  40 MG tablet Commonly known as: PROTONIX  Take 1 tablet (40 mg total) by mouth 2 (two) times daily.   rosuvastatin 10 MG tablet Commonly known as:  CRESTOR Take 10 mg by mouth daily.   terbinafine  250 MG tablet Commonly known as: LAMISIL  Take 1 tablet (250 mg total) by mouth daily.   Timolol Maleate (Once-Daily) 0.5 % Soln Apply 1 drop to eye daily.   valsartan  320 MG tablet Commonly known as: DIOVAN  Take 160 mg by mouth every morning.        Follow-up Information     Onita Rush, MD. Schedule an appointment as soon as possible for a visit in 1 week(s).   Specialty: Internal Medicine Contact information: [address] [place] [address] [phone]         [facility] Emergency Department at [facility] .   Specialty: Emergency Medicine Why: As needed Contact information: [address] [place]  [address] [phone]               Discharge Exam: Fredricka Weights   05/02/24 1508  Weight: (!) 142.8 kg   General: NAD  Cardiovascular: S1, S2 present Respiratory: CTAB Abdomen: Soft, nontender, nondistended, bowel sounds present Musculoskeletal: No bilateral pedal edema noted, noted right ankle swelling  Skin: Normal Psychiatry: Normal mood   Condition at discharge: stable  The results of significant diagnostics from this hospitalization (including imaging, microbiology, ancillary and laboratory) are listed below for reference.   Imaging Studies: VAS US  RENAL ARTERY DUPLEX Result Date: 05/05/2024 ABDOMINAL VISCERAL Patient Name:  [name] [facility]  Date of Exam:   05/04/2024 Medical Rec #: [id number]       Accession #:    [id number] Date of Birth: 10/27/1961      Patient Gender: M Patient Age:   57 years Exam Location:  [facility] Procedure:      VAS US  RENAL ARTERY DUPLEX Referring Phys: [id number] ARTIST POUCH -------------------------------------------------------------------------------- Indications: Hypertension High Risk Factors: Hypertension. Limitations: Air/bowel gas and obesity. Comparison Study: No prior studies. Performing Technologist: Cordella Collet RVT  Examination Guidelines: A complete evaluation includes B-mode imaging, spectral Doppler, color Doppler, and power Doppler as needed of all accessible portions of each vessel. Bilateral testing is considered an integral part of a complete examination. Limited examinations for reoccurring indications may be performed as noted.  Duplex Findings: +--------------------+--------+--------+------+--------------+ Mesenteric          PSV cm/sEDV cm/sPlaque   Comments    +--------------------+--------+--------+------+--------------+ Aorta Mid             111      3                         +--------------------+--------+--------+------+--------------+ Celiac Artery Origin                      Not visualized +--------------------+--------+--------+------+--------------+ SMA Origin                                Not visualized +--------------------+--------+--------+------+--------------+    +------------------+--------+--------+-------+ Right Renal ArteryPSV cm/sEDV cm/sComment +------------------+--------+--------+-------+ Origin               20      6            +------------------+--------+--------+-------+ Proximal             21      6            +------------------+--------+--------+-------+ Mid                  35      8            +------------------+--------+--------+-------+ Distal               36      2            +------------------+--------+--------+-------+ +-----------------+--------+--------+-------+ Left Renal ArteryPSV cm/sEDV cm/sComment +-----------------+--------+--------+-------+ Origin              83      12           +-----------------+--------+--------+-------+ Proximal            33      11           +-----------------+--------+--------+-------+ Mid                 40      13           +-----------------+--------+--------+-------+ Distal              46      11            +-----------------+--------+--------+-------+ +------------+--------+--------+----+-----------+--------+--------+----+  Right KidneyPSV cm/sEDV cm/sRI  Left KidneyPSV cm/sEDV cm/sRI   +------------+--------+--------+----+-----------+--------+--------+----+ Upper Pole  17      4       0.75Upper Pole 37      8       0.79 +------------+--------+--------+----+-----------+--------+--------+----+ Mid         25      7       0.        29      6       0.80 +------------+--------+--------+----+-----------+--------+--------+----+ Lower Pole  28      7       0.74Lower Pole 26      6       0.76 +------------+--------+--------+----+-----------+--------+--------+----+ Hilar       42      6       0.84Hilar      53      16      0.71 +------------+--------+--------+----+-----------+--------+--------+----+ +------------------+-----+------------------+-----+ Right Kidney           Left Kidney             +------------------+-----+------------------+-----+ RAR                    RAR                     +------------------+-----+------------------+-----+ RAR (manual)      0.38 RAR (manual)      0.75  +------------------+-----+------------------+-----+ Cortex                 Cortex                  +------------------+-----+------------------+-----+ Cortex thickness       Corex thickness         +------------------+-----+------------------+-----+ Kidney length (cm)11.50Kidney length (cm)11.00 +------------------+-----+------------------+-----+  Summary: Renal:  Right: Normal size right kidney. Normal right Resisitive Index. No        evidence of right renal artery stenosis. Left:  Normal left Resistive Index. Normal size of left kidney. No        evidence of left renal artery stenosis.  *See table(s) above for measurements and observations.  Diagnosing physician: Penne Colorado MD  Electronically signed by Penne Colorado MD on 05/05/2024 at 10:31:21 AM.    Final     ECHOCARDIOGRAM COMPLETE BUBBLE STUDY Result Date: 05/03/2024    ECHOCARDIOGRAM REPORT   Patient Name:   ELMOR KOST [facility] Date of Exam: 05/03/2024 Medical Rec #:  [id number]      Height:       70.0 in Accession #:    [id number]     Weight:       314.9 lb Date of Birth:  [birth year]     BSA:          2.531 m Patient Age:    62 years       BP:           149/79 mmHg Patient Gender: M              HR:           77 bpm. Exam Location:  Inpatient Procedure: 2D Echo, Cardiac Doppler, Color Doppler, Intracardiac Opacification            Agent and Saline Contrast Bubble Study (Both Spectral and Color Flow            Doppler were utilized during procedure). Indications:    Syncope780.2/R55  History:  Patient has prior history of Echocardiogram examinations. PFO                 closure and CHF, Prosthetic annuloplasty ring present in the                 mitral position. Procedure date: 02/2016, Arrythmias:Atrial                 Fibrillation, Signs/Symptoms:Syncope; Risk Factors:Hypertension.  Sonographer:    Koleen Popper RDCS Referring Phys: [id number] MARSHA ADA  Sonographer Comments: Image acquisition challenging due to patient body habitus. IMPRESSIONS  1. Left ventricular ejection fraction, by estimation, is 60 to 65%. The left ventricle has normal function. The left ventricle has no regional wall motion abnormalities. There is mild left ventricular hypertrophy. Left ventricular diastolic parameters are indeterminate.  2. Right ventricular systolic function is normal. The right ventricular size is normal.  3. Left atrial size was severely dilated.  4. Right atrial size was mildly dilated.  5. Prior MV repair with ring annuloplasty trivial residual MR . The mitral valve has been repaired/replaced. Trivial mitral valve regurgitation. No evidence of mitral stenosis.  6. The aortic valve is tricuspid. There is mild calcification of the aortic valve. Aortic valve regurgitation is mild. Aortic valve sclerosis is present,  with no evidence of aortic valve stenosis.  7. The inferior vena cava is dilated in size with >50% respiratory variability, suggesting right atrial pressure of 8 mmHg.  8. Agitated saline contrast bubble study was negative, with no evidence of any interatrial shunt. FINDINGS  Left Ventricle: Left ventricular ejection fraction, by estimation, is 60 to 65%. The left ventricle has normal function. The left ventricle has no regional wall motion abnormalities. Definity  contrast agent was given IV to delineate the left ventricular  endocardial borders. Strain was performed and the global longitudinal strain is indeterminate. The left ventricular internal cavity size was normal in size. There is mild left ventricular hypertrophy. Left ventricular diastolic parameters are indeterminate. Right Ventricle: The right ventricular size is normal. No increase in right ventricular wall thickness. Right ventricular systolic function is normal. Left Atrium: Left atrial size was severely dilated. Right Atrium: Right atrial size was mildly dilated. Pericardium: There is no evidence of pericardial effusion. Mitral Valve: Prior MV repair with ring annuloplasty trivial residual MR. The mitral valve has been repaired/replaced. Trivial mitral valve regurgitation. No evidence of mitral valve stenosis. MV peak gradient, 13.8 mmHg. The mean mitral valve gradient is 5.0 mmHg. Tricuspid Valve: The tricuspid valve is normal in structure. Tricuspid valve regurgitation is mild . No evidence of tricuspid stenosis. Aortic Valve: The aortic valve is tricuspid. There is mild calcification of the aortic valve. Aortic valve regurgitation is mild. Aortic valve sclerosis is present, with no evidence of aortic valve stenosis. Pulmonic Valve: The pulmonic valve was normal in structure. Pulmonic valve regurgitation is not visualized. No evidence of pulmonic stenosis. Aorta: The aortic root is normal in size and structure. Venous: The inferior vena cava is  dilated in size with greater than 50% respiratory variability, suggesting right atrial pressure of 8 mmHg. IAS/Shunts: No atrial level shunt detected by color flow Doppler. Agitated saline contrast was given intravenously to evaluate for intracardiac shunting. Agitated saline contrast bubble study was negative, with no evidence of any interatrial shunt. Additional Comments: 3D was performed not requiring image post processing on an independent workstation and was indeterminate.  LEFT VENTRICLE PLAX 2D LVIDd:         5.80 cm  Diastology LVIDs:         3.80 cm   LV e' medial:    12.60 cm/s LV PW:         1.00 cm   LV E/e' medial:  13.7 LV IVS:        1.30 cm   LV e' lateral:   9.57 cm/s LVOT diam:     2.40 cm   LV E/e' lateral: 18.1 LVOT Area:     4.52 cm  RIGHT VENTRICLE             IVC RV Basal diam:  4.20 cm     IVC diam: 2.60 cm RV Mid diam:    3.60 cm RV S prime:     11.80 cm/s LEFT ATRIUM             Index        RIGHT ATRIUM           Index LA diam:        5.40 cm 2.13 cm/m   RA Area:     20.00 cm LA Vol (A2C):   76.6 ml 30.26 ml/m  RA Volume:   48.90 ml  19.32 ml/m LA Vol (A4C):   83.4 ml 32.95 ml/m LA Biplane Vol: 81.5 ml 32.19 ml/m   AORTA Ao Root diam: 3.40 cm MITRAL VALVE MV Area (PHT): 5.38 cm     SHUNTS MV Peak grad:  13.8 mmHg    Systemic Diam: 2.40 cm MV Mean grad:  5.0 mmHg MV Vmax:       1.86 m/s MV Vmean:      93.3 cm/s MV Decel Time: 141 msec MV E velocity: 173.00 cm/s MV A velocity: 137.00 cm/s MV E/A ratio:  1.26 Maude Emmer MD Electronically signed by Maude Emmer MD Signature Date/Time: 05/03/2024/10:06:14 AM    Final    DG Chest Portable 1 View Result Date: 05/01/2024 CLINICAL DATA:  Near-syncope. EXAM: PORTABLE CHEST 1 VIEW COMPARISON:  01/22/2021. FINDINGS: Stable cardiomegaly. Prior cardiac valve replacement and atrial clipping. Minimal right basilar scarring/atelectasis. No focal consolidation, pleural effusion, or pneumothorax. No acute osseous abnormality. IMPRESSION: 1.  Minimal right basilar scarring/atelectasis. Otherwise, no acute cardiopulmonary findings. 2. Cardiomegaly. Electronically Signed   By: Harrietta Sherry M.D.   On: 05/01/2024 16:41    Microbiology: Results for orders placed or performed during the hospital encounter of 05/01/24  MRSA Next Gen by PCR, Nasal     Status: None   Collection Time: 05/02/24  3:08 PM   Specimen: Nasal Mucosa; Nasal Swab  Result Value Ref Range Status   MRSA by PCR Next Gen NOT DETECTED NOT DETECTED Final    Comment: (NOTE) The GeneXpert MRSA Assay (FDA approved for NASAL specimens only), is one component of a comprehensive MRSA colonization surveillance program. It is not intended to diagnose MRSA infection nor to guide or monitor treatment for MRSA infections. Test performance is not FDA approved in patients less than 23 years old. Performed at [facility], [address], [place], [place] [address]     Labs: CBC: Recent Labs  Lab 05/01/24 1525 05/02/24 1525 05/04/24 0236  WBC 10.6* 11.6* 8.8  NEUTROABS 7.1  --  5.0  HGB 16.2 16.1 15.6  HCT 45.6 47.9 44.6  MCV 91.9 93.2 92.3  PLT 283 321 266   Basic Metabolic Panel: Recent Labs  Lab 05/03/24 0258 05/04/24 0236 05/05/24 0234 05/06/24 0230 05/07/24 0252  NA 139 140 139 138 138  K  5.4* 4.0 3.9 4.0 4.1  CL 104 106 106 107 104  CO2 27 25 24 23 24   GLUCOSE 150* 118* 113* 117* 117*  BUN 29* 27* 24* 21 21  CREATININE 1.65* 1.61* 1.57* 1.47* 1.42*  CALCIUM  8.9 8.9 8.8* 8.7* 8.8*   Liver Function Tests: Recent Labs  Lab 05/01/24 1525 05/04/24 0236 05/05/24 0234  AST 20 21 24   ALT 19 22 27   ALKPHOS 59 39 40  BILITOT 0.3 0.6 0.8  PROT 7.0 6.2* 5.9*  ALBUMIN 4.3 3.4* 3.3*   CBG: Recent Labs  Lab 05/01/24 1630 05/01/24 1726 05/01/24 1854 05/01/24 2008 05/02/24 1415  GLUCAP 78 90 79 82 183*    Discharge time spent: greater than 30 minutes.  Signed: Lebron JINNY Cage, MD [facility] 05/07/2024

## 2024-05-08 ENCOUNTER — Ambulatory Visit: Payer: Self-pay | Admitting: *Deleted

## 2024-05-08 LAB — 5 HIAA, QUANTITATIVE, URINE, 24 HOUR
5-HIAA, Ur: 3.4 mg/L
5-HIAA,Quant.,24 Hr Urine: 7.1 mg/(24.h) (ref 0.0–14.9)
Total Volume: 2100

## 2024-05-09 ENCOUNTER — Encounter: Payer: Self-pay | Admitting: Cardiology

## 2024-05-09 LAB — METANEPHRINES, PLASMA
Metanephrine, Free: 723.9 pg/mL — ABNORMAL HIGH (ref 0.0–88.0)
Normetanephrine, Free: 1807.2 pg/mL — ABNORMAL HIGH (ref 0.0–285.2)

## 2024-05-09 LAB — CATECHOLAMINES, FRACTIONATED, PLASMA
Dopamine: 45.6 pg/mL — ABNORMAL HIGH (ref 0.0–36.7)
Epinephrine: 1425.5 pg/mL — ABNORMAL HIGH (ref 0.0–55.4)
Norepinephrine: 12060 pg/mL — ABNORMAL HIGH (ref 115–524)

## 2024-05-10 ENCOUNTER — Ambulatory Visit: Payer: Self-pay | Admitting: Pharmacist

## 2024-05-14 LAB — METANEPHRINES, PLASMA
Metanephrine, Free: 1113.6 pg/mL — ABNORMAL HIGH (ref 0.0–88.0)
Normetanephrine, Free: 2455.3 pg/mL — ABNORMAL HIGH (ref 0.0–285.2)

## 2024-05-15 ENCOUNTER — Other Ambulatory Visit (HOSPITAL_COMMUNITY): Payer: Self-pay | Admitting: Internal Medicine

## 2024-05-15 DIAGNOSIS — R7989 Other specified abnormal findings of blood chemistry: Secondary | ICD-10-CM

## 2024-05-15 LAB — CATECHOLAMINES,UR.,FREE,24 HR
Dopamine, Rand Ur: 101 ug/L
Dopamine, Ur, 24Hr: 212 ug/(24.h) (ref 0–510)
Epinephrine, Rand Ur: 126 ug/L
Epinephrine, U, 24Hr: 265 ug/(24.h) — ABNORMAL HIGH (ref 0–20)
Norepinephrine, Rand Ur: 306 ug/L
Norepinephrine,U,24H: 643 ug/(24.h) — ABNORMAL HIGH (ref 0–135)
Total Volume: 2100

## 2024-05-16 ENCOUNTER — Other Ambulatory Visit: Payer: Self-pay | Admitting: Cardiology

## 2024-05-16 ENCOUNTER — Encounter: Payer: Self-pay | Admitting: Cardiology

## 2024-05-16 NOTE — Telephone Encounter (Signed)
 Pt wife called to reiterate her message that Dr. Onita did place order for CT yesterday so we do not have to order it. Order is in Epic.

## 2024-05-18 ENCOUNTER — Ambulatory Visit (HOSPITAL_BASED_OUTPATIENT_CLINIC_OR_DEPARTMENT_OTHER)
Admission: RE | Admit: 2024-05-18 | Discharge: 2024-05-18 | Disposition: A | Discharge status: Home / Self Care | Source: Ambulatory Visit | Attending: Internal Medicine | Admitting: Internal Medicine

## 2024-05-18 DIAGNOSIS — R7989 Other specified abnormal findings of blood chemistry: Secondary | ICD-10-CM | POA: Insufficient documentation

## 2024-05-19 LAB — METANEPHRINES, URINE, 24 HOUR
Metaneph Total, Ur: 2725 ug/L
Metanephrines, 24H Ur: 5723 ug/(24.h) — ABNORMAL HIGH (ref 58–276)
Normetanephrine, 24H Ur: 8931 ug/(24.h) — ABNORMAL HIGH (ref 156–729)
Normetanephrine, Ur: 4253 ug/L
Total Volume: 2100

## 2024-05-21 ENCOUNTER — Other Ambulatory Visit (HOSPITAL_COMMUNITY): Payer: Self-pay | Admitting: Internal Medicine

## 2024-05-21 DIAGNOSIS — D3502 Benign neoplasm of left adrenal gland: Secondary | ICD-10-CM

## 2024-05-21 NOTE — Telephone Encounter (Signed)
Pt following up with you.

## 2024-05-22 ENCOUNTER — Ambulatory Visit (HOSPITAL_COMMUNITY)
Admission: RE | Admit: 2024-05-22 | Discharge: 2024-05-22 | Disposition: A | Discharge status: Home / Self Care | Source: Ambulatory Visit | Attending: Internal Medicine | Admitting: Internal Medicine

## 2024-05-22 ENCOUNTER — Other Ambulatory Visit: Payer: Self-pay

## 2024-05-22 ENCOUNTER — Encounter: Payer: Self-pay | Admitting: Cardiology

## 2024-05-22 DIAGNOSIS — D3502 Benign neoplasm of left adrenal gland: Secondary | ICD-10-CM | POA: Diagnosis present

## 2024-05-22 DIAGNOSIS — I4892 Unspecified atrial flutter: Secondary | ICD-10-CM

## 2024-05-22 MED ORDER — APIXABAN 5 MG PO TABS: 5.0000 mg | ORAL_TABLET | Freq: Two times a day (BID) | ORAL | 3 refills | Status: DC

## 2024-05-22 MED ORDER — GADOBUTROL 1 MMOL/ML IV SOLN
9.5000 mL | Freq: Once | INTRAVENOUS | Status: AC | PRN
  Administered 2024-05-22: 9.5 mL via INTRAVENOUS

## 2024-05-22 NOTE — Telephone Encounter (Signed)
 Pt last saw Dr Jeffrie 11/29/22, pt is overdue for follow-up.  Pt has scheduled appt on 08/05/24 with Dr Raford.  Last labs 05/07/24 Creat 1.42, age 63, weight 142.8kg, based on specified criteria pt is on appropriate dosage of Eliquis  5mg  BID for afib.  Will refill rx.

## 2024-05-23 ENCOUNTER — Ambulatory Visit: Admitting: General Practice

## 2024-06-04 NOTE — Progress Notes (Signed)
 Henry Dougherty , a patient of [place], Garnette LABOR, MD, was seen in conjunction with the resident, Dr. Nyren.  Please see her note for other details.   Briefly, the patient is referred to me for treatment of a pheochromocytoma.  He had markedly elevated plasma metanephrine and normetanephrine.  He also has a 5 cm mass in his left adrenal that is highly suspicious for a pheochromocytoma.  The patient thinks he has had genetic testing sent but the results are not yet available.  The recent MRI and CT scan of the abdomen were personally reviewed including images.  This shows a left adrenal lesion that is highly suspicious for pheochromocytoma.  Impression: Left adrenal pheochromocytoma.  Small right adrenal myelo lipoma that does not require any specific treatment.  On Eliquis  for atrial fibrillation.  Plan: Left robotic adrenalectomy.  Surgery is tentatively scheduled for August 25.  I will increase his doxazosin up to 16 mg daily if tolerated.  I have also asked him to start high salt diet.  We have also asked him to stop his Eliquis  the Friday before Monday surgery.  Today we discussed risks benefits and alternatives with the patient and gave him printed information regarding the risks of surgery. He understands all of this and desires to proceed as outlined.

## 2024-06-04 NOTE — Progress Notes (Signed)
 ------------------------------------------------------------------------------- Attestation signed by Henry Henry Ned, MD at 06/04/24 1352 I saw and evaluated the patient, participating in the key portions of the service.  I reviewed the resident's note.  I agree with the resident's findings and plan.   Henry Dougherty Luke, MD  -------------------------------------------------------------------------------   REFERRING PHYSICIAN: Nichole Dougherty LABOR, MD [phone] [facility] [place],  [place] [address]  CONSULTING PHYSICIANS: Patient Care Team: Pcp, None Per Patient as PCP - General  PRIMARY CARE PROVIDER: Pcp, None Per Patient    History and Physical  Assessment/Plan:  Impression:  63 y.o. M with PMHx of Afib (on Eliquis ) and MVR who presents with new diagnosis of pheochromocytoma. On imaging, he has a 5.3 cm L adrenal pheochromocytoma. We recommend robotic left adrenalectomy. We discussed the risks, benefits, and alternatives and [name] is agreeable to surgery.  In the mean time, we discussed increasing the dose of Doxazosin for alpha blockade. He will start 4mg  BID immediately, and if he does not have symptoms of hypotension then will increase it to 8mg  BID in one week. We also recommend he starts a high salt diet to expand his intravascular volume. We discussed the importance of these interventions to prepare his body for surgery. Precautions were reviewed, including hypotension, dizziness, etc.. Our contact information was shared.  Plan:   - Repeat labs today - Start Doxazosin 4mg  BID, increase to 8mg  in one week if tolerating - Start high salt diet - Plan for robotic left adrenalectomy on Aug 25th, consent obtained in clinic today - Plan to stop Eliquis  3 days prior to surgery as it is for Afib (and not for a valve)  History of Present Illness:  Chief Complaint:  Pheochromocytoma  Henry Dougherty is a  63 y.o. yo male patient of Dr. Garnette Henry,  who is seen in consultation for pheochromocytoma. He recently had admission for hypertensive crisis after about 3 months of labile blood pressures. He was found to have elevated urine catecholamines, epinephrine to 265 and norepinephrine to 643; urine and plasma metanephrines were similarly elevates in the 1000s. On CT a/p without contrast, he has a 5.3 cm L adrenal mass; this was redemonstrated on MRI abdomen with and without contrast. [facility] radiology review is pending.  Today, he reports ~2 years of symptoms, most prominently in the last 6-12 months where he has had episodes of sudden onset sweating, lower chest pain, weakness, and labile HTN with systolic blood pressure ranging from the 220s to the 90s. After his pheochromocytoma was diagnosed, his endocrinology started him on Doxazosin and he is currently taking 4 mg daily and Metoprolol . He is also seeing [facility] on Monday to discuss possible surgery with them.  No prior abdominal surgeries. He has a PMHx of afib/flutter (on Eliquis ), prior MV repair in 2017 (no indication for blood thinner), PFO closure, HFpEF, HTN, and obesity.  Allergies  Allergies[1]   Medications    Current Medications[2]   Past Medical History  Past Medical History[3]   Past Surgical History  Past Surgical History[4]   Family History  Family History[5]  Social History:  Tobacco use: Tobacco Use History[6] Alcohol use:  Social History   Substance and Sexual Activity  Alcohol Use Not Currently  . Alcohol/week: 4.0 standard drinks of alcohol  . Types: 3 Glasses of wine, 1 Standard drinks or equivalent per week   Drug use:  Social History   Substance and Sexual Activity  Drug Use Never     Objective   Vital  Signs  Vitals:   06/04/24 1236  BP: 127/67  Pulse: 70  Resp: 18  Temp: 36.6 C (97.9 F)  SpO2: 99%    Physical Exam  General Appearance:  No acute distress, well-appearing. Hair and skin normal in texture and character for age.   Voice normal in character.  Eyes:  Conjuctiva and lids appear normal.   There is no proptosis  Neck: The neck is supple with no palpable masses or thyromegaly.   Pulmonary:    Normal respiratory effort.   Cardiovascular:  Regular rate and rhythm  Abdomen: Soft, non-tender  Extremities: Grossly normal neuromuscular function. No pedal edema.  Neurologic: Muscle strength within normal limits.  No visible tremor.   Psychiatric: Judgement and insight appropriate.  Oriented to person, place, and time. Normal affect.        [1] Allergies Allergen Reactions  . Prednisone Other (See Comments)    Glaucoma HX ,  Triggers increased pressure in eye  . Corticosteroids (Glucocorticoids)     Glaucoma HX ,  Triggers increased pressure in eye  . Amiodarone  Hives    Blisters and whelps to IV FORM.  [2]  Current Outpatient Medications:  .  apixaban  (ELIQUIS ) 5 mg Tab, Take 1 tablet (5 mg total) by mouth two (2) times a day., Disp: , Rfl:  .  colchicine  (COLCRYS ) 0.6 mg tablet, Take 1 tablet (0.6 mg total) by mouth daily as needed (gout flares)., Disp: , Rfl:  .  metoPROLOL  succinate (TOPROL -XL) 25 MG 24 hr tablet, Take 2 tablets (50 mg total) by mouth daily., Disp: , Rfl:  .  doxazosin (CARDURA) 4 MG tablet, Take 1 tablet (4 mg total) by mouth two (2) times a day. After 7 days, increase to 2 tablets (8 mg) two times a day., Disp: 90 tablet, Rfl: 1 [3] Past Medical History: Diagnosis Date  . CHF (congestive heart failure)    03/2016   Open heart surgery 2017 to repair heart valve  . Diabetes mellitus    2024   Pre diabetes  . Hypertension   [4] Past Surgical History: Procedure Laterality Date  . CARDIAC VALVE SURGERY  2017  . COLONOSCOPY  2023  . KNEE SURGERY Right 1995  . SHOULDER ARTHROSCOPY Left 1996  [5] Family History Problem Relation Age of Onset  . Heart disease Mother   . Colon cancer Father   . Other (pacemaker) Father   . Heart attack Paternal Grandfather   [6] Social  History Tobacco Use  Smoking Status Never  Smokeless Tobacco Never

## 2024-06-19 NOTE — Telephone Encounter (Signed)
 Pt has had surgery since this encounter and has not called back with any further concerns.

## 2024-06-23 ENCOUNTER — Other Ambulatory Visit: Payer: Self-pay | Admitting: Podiatry

## 2024-06-23 DIAGNOSIS — B351 Tinea unguium: Secondary | ICD-10-CM

## 2024-07-04 ENCOUNTER — Ambulatory Visit: Admitting: Nurse Practitioner

## 2024-07-09 ENCOUNTER — Ambulatory Visit: Admitting: General Practice

## 2024-08-05 ENCOUNTER — Encounter (HOSPITAL_BASED_OUTPATIENT_CLINIC_OR_DEPARTMENT_OTHER): Payer: Self-pay | Admitting: Nurse Practitioner

## 2024-08-05 ENCOUNTER — Ambulatory Visit (INDEPENDENT_AMBULATORY_CARE_PROVIDER_SITE_OTHER): Admitting: Nurse Practitioner

## 2024-08-05 ENCOUNTER — Encounter (HOSPITAL_BASED_OUTPATIENT_CLINIC_OR_DEPARTMENT_OTHER): Payer: Self-pay

## 2024-08-05 ENCOUNTER — Institutional Professional Consult (permissible substitution) (HOSPITAL_BASED_OUTPATIENT_CLINIC_OR_DEPARTMENT_OTHER): Admitting: Cardiovascular Disease

## 2024-08-05 VITALS — BP 137/74 | HR 86 | Resp 16 | Ht 70.0 in | Wt 307.9 lb

## 2024-08-05 DIAGNOSIS — E785 Hyperlipidemia, unspecified: Secondary | ICD-10-CM

## 2024-08-05 DIAGNOSIS — I10 Essential (primary) hypertension: Secondary | ICD-10-CM

## 2024-08-05 DIAGNOSIS — Z8639 Personal history of other endocrine, nutritional and metabolic disease: Secondary | ICD-10-CM

## 2024-08-05 DIAGNOSIS — I5020 Unspecified systolic (congestive) heart failure: Secondary | ICD-10-CM

## 2024-08-05 DIAGNOSIS — Z9889 Other specified postprocedural states: Secondary | ICD-10-CM

## 2024-08-05 DIAGNOSIS — I480 Paroxysmal atrial fibrillation: Secondary | ICD-10-CM | POA: Diagnosis not present

## 2024-08-05 DIAGNOSIS — I4892 Unspecified atrial flutter: Secondary | ICD-10-CM | POA: Diagnosis not present

## 2024-08-05 DIAGNOSIS — Z7901 Long term (current) use of anticoagulants: Secondary | ICD-10-CM

## 2024-08-05 DIAGNOSIS — I700 Atherosclerosis of aorta: Secondary | ICD-10-CM

## 2024-08-05 DIAGNOSIS — Z8679 Personal history of other diseases of the circulatory system: Secondary | ICD-10-CM

## 2024-08-05 NOTE — Progress Notes (Deleted)
 Advanced Hypertension Clinic Initial Assessment:    Date:  08/05/2024   ID:  Henry Dougherty, DOB 1961-09-20, MRN [id number]  PCP:  Onita Norleen, MD  Cardiologist:  Oneil Parchment, MD  Nephrologist:  Referring MD: Onita Norleen, MD   CC: Hypertension  History of Present Illness:    Henry Dougherty is a 63 y.o. male with a hx of atrial fibrillation/flutter, labile HTN, s/p mitral valve repair, s/p PFO closure here to establish care in the Advanced Hypertension Clinic. He was seen in the hospital 04/2024 with very labile BP.  SBP ranged from 80s to 200s systolic.  It was thought this may be related to his Mounjaro , which was discontinued.  He had 33lb weight loss on this medication.  His amlodipine  and hydrochlorothiazide  were discontinued.  Per family request, carvedilol  was discontinued and he was started on metoprolol .  24H urine metanephrines and nometanephrines were both markedly elevated. Epinephrine and norepinephrine were both elevated.  He had CT and MR of the abdomen which revelaed 5.3 cm adrenal mass concerning for pheochromocytoma.  He was referred to endocrine surgery.  He was seen by [facility] and noted improvement in his BP since starting doxazosin.    Discussed the use of AI scribe software for clinical note transcription with the patient, who gave verbal consent to proceed.  History of Present Illness      Previous antihypertensives:  Secondary Causes of Hypertension  Medications/Herbal: OCP, steroids, stimulants, antidepressants, weight loss medication, immune suppressants, NSAIDs, sympathomimetics, alcohol, caffeine, licorice, ginseng, [place]'s wort, chemo  Sleep Apnea Renal artery stenosis Hyperaldosteronism Hyper/hypothyroidism Pheochromocytoma: palpitations, tachycardia, headache, diaphoresis (plasma metanephrines) Cushing's syndrome: Cushingoid facies, central obesity, proximal muscle weakness, and ecchymoses, adrenal incidentaloma (cortisol) Coarctation of  the aorta  Past Medical History:  Diagnosis Date   Allergic rhinitis    Atrial fibrillation with RVR (HCC) 03/2020   CHF (congestive heart failure) (HCC)    EF 45%/ VALVULAR CM   DOE (dyspnea on exertion)    Dyspepsia    Environmental allergies    Hypertension    OA (osteoarthritis)    Obese    OSA on CPAP 05/03/2024   Preglaucoma    SOB (shortness of breath)    Uveitis    AS A TEENAGER    Past Surgical History:  Procedure Laterality Date   CARDIAC SURGERY     CARDIOVERSION N/A 04/01/2020   Procedure: CARDIOVERSION;  Surgeon: Barbaraann Darryle Ned, MD;  Location: [facility] ENDOSCOPY;  Service: Cardiovascular;  Laterality: N/A;   COLONOSCOPY     PT WAS 47 IN [place]- 2-3 TINY POLYPS   COLONOSCOPY WITH PROPOFOL  N/A 04/21/2022   Procedure: COLONOSCOPY WITH PROPOFOL ;  Surgeon: Stacia Glendia BRAVO, MD;  Location: [facility] ENDOSCOPY;  Service: Gastroenterology;  Laterality: N/A;   KNEE ARTHROSCOPY Right 2005   MITRAL VALVE REPAIR     POLYPECTOMY  04/21/2022   Procedure: POLYPECTOMY;  Surgeon: Stacia Glendia BRAVO, MD;  Location: [facility] ENDOSCOPY;  Service: Gastroenterology;;   SHOULDER ARTHROSCOPY Right 2005   TEE WITHOUT CARDIOVERSION N/A 04/01/2016   Procedure: TRANSESOPHAGEAL ECHOCARDIOGRAM (TEE);  Surgeon: Oneil JAYSON Parchment, MD;  Location: [facility] ENDOSCOPY;  Service: Cardiovascular;  Laterality: N/A;   TEE WITHOUT CARDIOVERSION N/A 04/01/2020   Procedure: TRANSESOPHAGEAL ECHOCARDIOGRAM (TEE);  Surgeon: Barbaraann Darryle Ned, MD;  Location: [facility] ENDOSCOPY;  Service: Cardiovascular;  Laterality: N/A;    Current Medications: No outpatient medications have been marked as taking for the 08/05/24 encounter (Appointment) with Raford Riggs, MD.  Allergies:   Amiodarone  and Cortisone   Social History   Socioeconomic History   Marital status: Married    Spouse name: Not on file   Number of children: 3   Years of education: Not on file   Highest education level: Not on file  Occupational History    Occupation: VP FOR [employer]  Tobacco Use   Smoking status: Never   Smokeless tobacco: Never  Vaping Use   Vaping status: Never Used  Substance and Sexual Activity   Alcohol use: Yes   Drug use: No   Sexual activity: Not on file  Other Topics Concern   Not on file  Social History Narrative   Not on file   Social Drivers of Health   Financial Resource Strain: Not on file  Food Insecurity: No Food Insecurity (05/02/2024)   Hunger Vital Sign    Worried About Running Out of Food in the Last Year: Never true    Ran Out of Food in the Last Year: Never true  Transportation Needs: No Transportation Needs (05/02/2024)   PRAPARE - Administrator, Civil Service (Medical): No    Lack of Transportation (Non-Medical): No  Physical Activity: Not on file  Stress: Not on file  Social Connections: Not on file     Family History: The patient's ***family history includes Atrial fibrillation in his mother; Colon cancer in his father; Heart Problems in his father and mother; Heart attack in his maternal grandfather and paternal grandfather; Stroke in his paternal grandfather. There is no history of Pancreatic cancer, Esophageal cancer, Liver cancer, or Stomach cancer.  ROS:   Please see the history of present illness.    *** All other systems reviewed and are negative.  EKGs/Labs/Other Studies Reviewed:    EKG:  EKG is *** ordered today.  The ekg ordered today demonstrates ***  Recent Labs: 05/02/2024: B Natriuretic Peptide 208.6 05/03/2024: TSH 1.488 05/04/2024: Hemoglobin 15.6; Platelets 266 05/05/2024: ALT 27 05/07/2024: BUN 21; Creatinine, Ser 1.42; Potassium 4.1; Sodium 138   Recent Lipid Panel    Component Value Date/Time   TRIG 259 (A) 10/10/2023 0000   LDLCALC 134 10/10/2023 0000    Physical Exam:   VS:  There were no vitals taken for this visit. , BMI There is no height or weight on file to calculate BMI. GENERAL:  Well appearing HEENT: Pupils equal round and  reactive, fundi not visualized, oral mucosa unremarkable NECK:  No jugular venous distention, waveform within normal limits, carotid upstroke brisk and symmetric, no bruits, no thyromegaly LYMPHATICS:  No cervical adenopathy LUNGS:  Clear to auscultation bilaterally HEART:  RRR.  PMI not displaced or sustained,S1 and S2 within normal limits, no S3, no S4, no clicks, no rubs, *** murmurs ABD:  Flat, positive bowel sounds normal in frequency in pitch, no bruits, no rebound, no guarding, no midline pulsatile mass, no hepatomegaly, no splenomegaly EXT:  2 plus pulses throughout, no edema, no cyanosis no clubbing SKIN:  No rashes no nodules NEURO:  Cranial nerves II through XII grossly intact, motor grossly intact throughout PSYCH:  Cognitively intact, oriented to person place and time   ASSESSMENT/PLAN:    No problem-specific Assessment & Plan notes found for this encounter.   Screening for Secondary Hypertension: { Click here to document screening for secondary causes of HTN  :[id number]}    Relevant Labs/Studies:    Latest Ref Rng & Units 05/07/2024    2:52 AM 05/06/2024    2:30 AM  05/05/2024    2:34 AM  Basic Labs  Sodium 135 - 145 mmol/L 138  138  139   Potassium 3.5 - 5.1 mmol/L 4.1  4.0  3.9   Creatinine 0.61 - 1.24 mg/dL 8.57  8.52  8.42        Latest Ref Rng & Units 05/03/2024    3:42 PM 10/10/2023   12:00 AM  Thyroid   TSH 0.350 - 4.500 uIU/mL 1.488  1.57         This result is from an external source.       Latest Ref Rng & Units 05/04/2024    2:36 AM  Renin/Aldosterone   Aldosterone 0.0 - 30.0 ng/dL 1.8        Latest Ref Rng & Units 05/03/2024    3:42 PM 04/30/2024    8:05 AM  Metanephrines/Catecholamines   Epinephrine 0.0 - 55.4 pg/mL  1,425.5   Norepinephrine 115 - 524 pg/mL  12,060   Dopamine 0.0 - 36.7 pg/mL  45.6   Metanephrines 0.0 - 88.0 pg/mL 1,113.6  723.9   Normetanephrines  0.0 - 285.2 pg/mL 2,455.3  1,807.2           05/04/2024   10:08 AM   Renovascular   Renal Artery US  Completed Yes        Disposition:    FU with MD/PharmD in {gen number [id number]} {Days to years:10300}    Medication Adjustments/Labs and Tests Ordered: Current medicines are reviewed at length with the patient today.  Concerns regarding medicines are outlined above.  No orders of the defined types were placed in this encounter.  No orders of the defined types were placed in this encounter.    Signed, Annabella Scarce, MD  08/05/2024 8:12 AM    [facility]

## 2024-08-05 NOTE — Patient Instructions (Signed)
 Medication Instructions:   DISCONTINUE Eliquis .  *If you need a refill on your cardiac medications before your next appointment, please call your pharmacy*  Lab Work:   None ordered.  If you have labs (blood work) drawn today and your tests are completely normal, you will receive your results only by: MyChart Message (if you have MyChart) OR A paper copy in the mail If you have any lab test that is abnormal or we need to change your treatment, we will call you to review the results.  Testing/Procedures:  None ordered.  Follow-Up: At [facility], you and your health needs are our priority.  As part of our continuing mission to provide you with exceptional heart care, our providers are all part of one team.  This team includes your primary Cardiologist (physician) and Advanced Practice Providers or APPs (Physician Assistants and Nurse Practitioners) who all work together to provide you with the care you need, when you need it.  Your next appointment:   3 month(s)  Provider:   Rosaline Bane, NP    We recommend signing up for the patient portal called MyChart.  Sign up information is provided on this After Visit Summary.  MyChart is used to connect with patients for Virtual Visits (Telemedicine).  Patients are able to view lab/test results, encounter notes, upcoming appointments, etc.  Non-urgent messages can be sent to your provider as well.   To learn more about what you can do with MyChart, go to [email or web address].

## 2024-08-05 NOTE — Progress Notes (Signed)
 " Cardiology Office Note   Date:  08/06/2024  ID:  Henry, Dougherty 01-12-1961, MRN [id number] PCP: Onita Norleen, MD  [facility] Providers Cardiologist:  Oneil Parchment, MD Electrophysiologist:  OLE ONEIDA HOLTS, MD     PMH Hypertension Type 2 diabetes Mitral valve repair 2017 Atrial fibrillation s/p Maze procedure PFO closure CHF Obesity  Previously seen by [facility] cardiology.  Initially seen by Dr. Parchment 12/23/2019 for evaluation of atrial fibrillation.  He had undergone the maze procedure as well as left atrial clipping and closure of PFO in [place].  He reported in May 2017 he had sudden onset shortness of breath and went to the ED at [facility] in [place] .  He was found to be in atrial fibrillation and had a flail segment of mitral valve.  On 03/17/2016 he underwent mitral valve repair and atrial maze procedure by Dr. Maree.  He also had incidental finding of PFO which was closed at time of surgery.  Readmission 12 days postop for hypoxic respiratory failure and pneumonia.  Right pleural effusion was present and he was in rapid atrial flutter.  He had acute renal failure.  He was diuresed and reloaded on amiodarone  at that time.  Blood culture was positive for MRSA so TEE was performed and was unremarkable.  He was seen by Dr. Holts 02/2021 regarding discussion of AAD versus ablation.  He opted to try lifestyle modifications and increased metoprolol  to 100 mg daily.  On 04/18/2021 he called to report diaphoresis every morning with lightheadedness and fatigue.  Metoprolol  was decreased to 75 mg twice daily.  He has been seen by Pharm D for management of difficult to control hypertension.  He was found to have pheochromocytoma and under went adrenalectomy at [facility] 07/08/2024.  Current antihypertensive therapy is valsartan  160 mg daily, Toprol -XL 100 mg daily, Imdur  30 mg daily, amlodipine  5 mg daily.  History of Present Illness  Discussed the use of AI  scribe software for clinical note transcription with the patient, who gave verbal consent to proceed. History of Present Illness Henry Dougherty is a very pleasant 63 year old male who is here today to discuss the continuation of Eliquis . He is accompanied by his wife. In 2017, he underwent mitral valve repair, including a maze procedure and left atrial appendage clipping for atrial fibrillation. In late 2021 and March 2022, he had return of a fib/flutter and underwent cardioversion. Second episode thought to be exacerbated by GI illness. Was initially on Pradaxa , he was switched to Eliquis .  He has remained on Eliquis  since that time.  No bleeding concerns. In June, he was hospitalized for fluctuating blood pressure due to a pheochromocytoma.  He underwent surgery at [facility] on July 08, 2024. During this time, he did not experience atrial fibrillation. Post-surgery, his blood pressure stabilized, and he is currently not taking any antihypertensive therapy.  Highest SBP has been 137 with most readings 120 or lower.  Blood pressure medications. His blood sugar levels have normalized, and he no longer requires diabetes medication. He has lost 50 pounds and maintains a healthy diet and exercise routine. His A1c and blood sugar levels have improved since the pheochromocytoma removal. He is concerned about the cost of Eliquis  with upcoming changes in his health insurance and is exploring alternative options.  States also several healthcare providers during his hospitalization for adrenalectomy asked about remaining on Eliquis .  He denies chest pain, palpitations, orthopnea, PND, edema, presyncope, syncope.  He has been  inactive for several months due to symptoms associated with pheochromocytoma and awaiting adrenalectomy and then post-op recovery. He is feeling well and is working on increasing physical activity.    ROS: See HPI  Studies Reviewed EKG Interpretation Date/Time:  Monday August 05 2024 14:49:30  EDT Ventricular Rate:  88 PR Interval:  124 QRS Duration:  90 QT Interval:  378 QTC Calculation: 457 R Axis:   10  Text Interpretation: Sinus rhythm with marked sinus arrhythmia with occasional Premature ventricular complexes When compared with ECG of 05-May-2024 10:41, Premature ventricular complexes are now Present Confirmed by Percy Browning [phone]) on 08/05/2024 3:06:18 PM     No results found for: LIPOA  Risk Assessment/Calculations  CHA2DS2-VASc Score = 3   This indicates a 3.2% annual risk of stroke. The patient's score is based upon: CHF History: 1 HTN History: 1 Diabetes History: 1 Stroke History: 0 Vascular Disease History: 0 Age Score: 0 Gender Score: 0            Physical Exam VS:  BP 137/74 (BP Location: Left Arm, Patient Position: Sitting, Cuff Size: Large)   Pulse 86   Resp 16   Ht 5' 10 (1.778 m)   Wt (!) 307 lb 14.4 oz (139.7 kg)   SpO2 98%   BMI 44.18 kg/m    Wt Readings from Last 3 Encounters:  08/05/24 (!) 307 lb 14.4 oz (139.7 kg)  05/02/24 (!) 314 lb 14.4 oz (142.8 kg)  04/03/24 (!) 320 lb (145.2 kg)    GEN: Obese, well developed in no acute distress NECK: No JVD; No carotid bruits CARDIAC: RRR, no murmurs, rubs, gallops RESPIRATORY:  Clear to auscultation without rales, wheezing or rhonchi  ABDOMEN: Soft, non-tender, non-distended EXTREMITIES:  No edema; No deformity    Assessment & Plan Anticoagulation management  PAF S/p MAZE and atrial appendage clipping History of atrial fib/flutter s/p Maze procedure and left atrial appendage clipping in 2017. Return of a fib in 2021 and 2022 which was successfully cardioverted. EKG today reveals NSR with sinus arrhythmia and occasional PVC.  He reports no recent A-fib/flutter episodes and there is no documentation of A-fib during recent hospitalization for adrenalectomy.  We had a lengthy discussion about CHA2DS2-VASc scoring and risk of stroke associated with A-fib.  He is currently off  antihypertensive medications with well-controlled blood pressure.  CHF occurred in the setting of mitral valve flail segment which was repaired surgically, and he is no longer on medication for diabetes.  Therefore CHA2DS2-VASc score would be 1 in October when he turns age 54 years old.  He is agreeable to continue the medication if it is necessary, however there may be an alternative medication recommended by insurance. Discussed discontinuing Eliquis  due to low stroke risk and absence of recent AFib. Explained stroke risk if AFib returns and Eliquis 's role. Discussed alternative anticoagulants and Kardia device for monitoring.  We will confirm agreement with Dr. Jeffrie, primary cardiologist. -Consider Kardia device for home monitoring of a fib -Discontinue Eliquis  after confirming with Dr. Jeffrie  S/p Pheochromocytoma resection History of hypertension History of diabetes He underwent laparoscopic adrenalectomy at [facility] on 07/08/2024.  Post surgery, he has been off antihypertensive therapy and metformin which was being used for management of diabetes.  BP is well-controlled with highest SBP 137, but the majority of readings 120s over 70s mmHg. Blood sugar is well controlled. He is feeling well.  -Continue post surgical management with [facility] -Continue to monitor home BP for goal BP < 130/80  S/p Mitral valve repair HFrEF History of heart failure with preserved ejection fraction at time he was found to have flail segment of mitral valve.  He underwent mitral valve repair in [place]  in 2017.  He had recovery of heart function with most recent echo 05/03/2024 with normal LVEF 60 to 65%, trivial mitral valve regurgitation, stable mitral valve repair.  He reports some shortness of breath that he feels is secondary to deconditioning, no chest pain, orthopnea, PND, or edema.  He has achieved weight loss through improved diet.  He appears euvolemic on exam. He is not currently on medical therapy for history of  HFrEF.  -Continue to monitor MV clinically for now  Hyperlipidemia  LDL goal < 70 Aortic atherosclerosis Hyperlipidemia previously managed with rosuvastatin. Medication paused due to potential drug interactions during recent hospitalization. Last lipid panel 04/22/2024 with total cholesterol 159, HDL 59, LDL 75, and triglycerides 872. He denies chest pain, dyspnea, or other symptoms concerning for angina.  No indication for further ischemic evaluation at this time   He is working to regain regular physical activity s/p endocrine surgery. -Continue rosuvastatin  - Aim to increase physical activity for goal of at least 150 minutes of moderate intensity exercise each week - Heart healthy, whole food diet avoiding saturated fat, processed foods, sugar, and other simple carbohydrates        Dispo: 3 months with me   Signed, Rosaline Bane, NP-C "

## 2024-08-06 ENCOUNTER — Ambulatory Visit: Admitting: Nurse Practitioner

## 2024-08-07 ENCOUNTER — Encounter (HOSPITAL_BASED_OUTPATIENT_CLINIC_OR_DEPARTMENT_OTHER): Payer: Self-pay

## 2024-09-11 ENCOUNTER — Telehealth (HOSPITAL_BASED_OUTPATIENT_CLINIC_OR_DEPARTMENT_OTHER): Payer: Self-pay

## 2024-09-11 NOTE — Telephone Encounter (Signed)
 Henry Dougherty,  You saw this patient on 08/05/2024 . Patient reports having similar cardiac exam with stable EKG at most recent Cards OV, however requesting risk stratification given frequent ectopy on cardiac exam today. He reports he was taken off of Eliquis  within the past month, however this medication would need to be held if he were to restart for some reason given glaucoma portion of surgery.   Per protocol we request that you comment on his cardiac risk to proceed with  Cataract Extraction By PE, IOL - Left Eye with Istent/Hydrus (Glaucoma surgery) under IV sedation. Right Eye (TBD) likely 2-4 weeks after Left Eye  since it has been less than 2 months since evaluated in the office. Please send your comment to P CV Pre-Op Pool.  Thank you, Henry Satterfield DNP, ANP, AACC.

## 2024-09-11 NOTE — Telephone Encounter (Signed)
   Pre-operative Risk Assessment    Patient Name: Henry Dougherty  DOB: [birth year] MRN: [id number]   Date of last office visit: 08/05/2024 - Rosaline Bane, NP  Date of next office visit: 11/12/2024 - Rosaline Bane, NP   Request for Surgical Clearance    Procedure:  Cataract Extraction By PE, IOL - Left Eye with Istent/Hydrus (Glaucoma surgery) under IV sedation. Right Eye (TBD) likely 2-4 weeks after Left Eye   Date of Surgery:  Clearance 10/07/24                                 Surgeon:  Dr. Mabel KATHEE Hum Surgeon's Group or Practice Name:  [facility] Phone number:  [phone] ext: [phone] Fax number:  [phone]   Type of Clearance Requested:   - Medical    Type of Anesthesia:  IV sedation   Additional requests/questions:  Patient reports having similar cardiac exam with stable EKG at most recent Cards OV, however requesting risk stratification given frequent ectopy on cardiac exam today. He reports he was taken off of Eliquis  within the past month, however this medication would need to be held if he were to restart for some reason given glaucoma portion of surgery.  SignedPatrcia Hong L   09/11/2024, 8:54 AM

## 2024-09-12 NOTE — Telephone Encounter (Signed)
   Primary Cardiologist: Oneil Parchment, MD  Chart reviewed as part of pre-operative protocol coverage. Given past medical history and time since last visit, based on ACC/AHA guidelines, Henry Dougherty would be at acceptable risk for the planned procedure without further cardiovascular testing.   Patient should contact our office if he is having new symptoms that are concerning from a cardiac perspective to arrange a follow-up appointment.    I will route this recommendation to the requesting party via Epic fax function and remove from pre-op pool.  Please call with questions.  Rosaline EMERSON Bane, NP-C 09/12/2024, 7:14 AM [address], [address] [place], [place] [address] Office [phone] Fax [phone]

## 2024-09-18 ENCOUNTER — Other Ambulatory Visit: Payer: Self-pay | Admitting: Cardiology

## 2024-11-12 ENCOUNTER — Ambulatory Visit (HOSPITAL_BASED_OUTPATIENT_CLINIC_OR_DEPARTMENT_OTHER): Admitting: Nurse Practitioner
